# Patient Record
Sex: Male | Born: 1935 | Race: White | Hispanic: No | Smoking: Former smoker
Health system: Southern US, Community
[De-identification: ages and names within clinical notes are randomized; demographics above are authoritative.]

## PROBLEM LIST (undated history)

## (undated) DIAGNOSIS — C4490 Unspecified malignant neoplasm of skin, unspecified: Secondary | ICD-10-CM

## (undated) DIAGNOSIS — N400 Enlarged prostate without lower urinary tract symptoms: Secondary | ICD-10-CM

## (undated) DIAGNOSIS — E7800 Pure hypercholesterolemia, unspecified: Secondary | ICD-10-CM

## (undated) DIAGNOSIS — H9190 Unspecified hearing loss, unspecified ear: Secondary | ICD-10-CM

## (undated) DIAGNOSIS — Z66 Do not resuscitate: Secondary | ICD-10-CM

## (undated) DIAGNOSIS — R011 Cardiac murmur, unspecified: Secondary | ICD-10-CM

## (undated) DIAGNOSIS — I10 Essential (primary) hypertension: Secondary | ICD-10-CM

## (undated) DIAGNOSIS — F419 Anxiety disorder, unspecified: Secondary | ICD-10-CM

## (undated) DIAGNOSIS — K219 Gastro-esophageal reflux disease without esophagitis: Secondary | ICD-10-CM

## (undated) DIAGNOSIS — F0390 Unspecified dementia without behavioral disturbance: Secondary | ICD-10-CM

## (undated) DIAGNOSIS — K5909 Other constipation: Secondary | ICD-10-CM

## (undated) DIAGNOSIS — M1990 Unspecified osteoarthritis, unspecified site: Secondary | ICD-10-CM

## (undated) HISTORY — DX: Other constipation: K59.09

## (undated) HISTORY — PX: EYE SURGERY: SHX253

## (undated) HISTORY — DX: Pure hypercholesterolemia, unspecified: E78.00

## (undated) HISTORY — DX: Cardiac murmur, unspecified: R01.1

## (undated) HISTORY — PX: ANAL FISSURE REPAIR: SHX2312

## (undated) HISTORY — DX: Unspecified malignant neoplasm of skin, unspecified: C44.90

## (undated) HISTORY — PX: CHOLECYSTECTOMY: SHX55

---

## 2004-02-01 ENCOUNTER — Ambulatory Visit: Payer: Self-pay | Admitting: Unknown Physician Specialty

## 2006-05-01 ENCOUNTER — Ambulatory Visit: Payer: Self-pay | Admitting: Urology

## 2006-07-27 ENCOUNTER — Other Ambulatory Visit: Payer: Self-pay

## 2006-07-27 ENCOUNTER — Emergency Department: Payer: Self-pay | Admitting: Emergency Medicine

## 2007-03-21 ENCOUNTER — Other Ambulatory Visit: Payer: Self-pay

## 2007-03-21 ENCOUNTER — Ambulatory Visit: Payer: Self-pay | Admitting: Internal Medicine

## 2008-01-22 ENCOUNTER — Ambulatory Visit: Payer: Self-pay | Admitting: Unknown Physician Specialty

## 2008-03-21 ENCOUNTER — Ambulatory Visit: Payer: Self-pay | Admitting: Unknown Physician Specialty

## 2008-08-22 ENCOUNTER — Emergency Department: Payer: Self-pay | Admitting: Unknown Physician Specialty

## 2011-01-20 DIAGNOSIS — I10 Essential (primary) hypertension: Secondary | ICD-10-CM | POA: Insufficient documentation

## 2011-04-01 DIAGNOSIS — E7800 Pure hypercholesterolemia, unspecified: Secondary | ICD-10-CM | POA: Insufficient documentation

## 2011-04-01 HISTORY — DX: Pure hypercholesterolemia, unspecified: E78.00

## 2013-12-03 ENCOUNTER — Inpatient Hospital Stay: Payer: Self-pay | Admitting: Internal Medicine

## 2013-12-03 LAB — URINALYSIS, COMPLETE
Bacteria: NONE SEEN
Blood: NEGATIVE
Glucose,UR: NEGATIVE mg/dL (ref 0–75)
Leukocyte Esterase: NEGATIVE
NITRITE: NEGATIVE
PH: 5 (ref 4.5–8.0)
Protein: 100
Specific Gravity: 1.026 (ref 1.003–1.030)
Squamous Epithelial: 1
WBC UR: 8 /HPF (ref 0–5)

## 2013-12-03 LAB — CBC
HCT: 42.2 % (ref 40.0–52.0)
HGB: 13.9 g/dL (ref 13.0–18.0)
MCH: 30.1 pg (ref 26.0–34.0)
MCHC: 32.9 g/dL (ref 32.0–36.0)
MCV: 92 fL (ref 80–100)
PLATELETS: 216 10*3/uL (ref 150–440)
RBC: 4.61 10*6/uL (ref 4.40–5.90)
RDW: 14.6 % — ABNORMAL HIGH (ref 11.5–14.5)
WBC: 17 10*3/uL — ABNORMAL HIGH (ref 3.8–10.6)

## 2013-12-03 LAB — COMPREHENSIVE METABOLIC PANEL
ALBUMIN: 3.7 g/dL (ref 3.4–5.0)
Alkaline Phosphatase: 105 U/L
Anion Gap: 11 (ref 7–16)
BUN: 18 mg/dL (ref 7–18)
Bilirubin,Total: 1.4 mg/dL — ABNORMAL HIGH (ref 0.2–1.0)
CALCIUM: 9.4 mg/dL (ref 8.5–10.1)
CREATININE: 1.35 mg/dL — AB (ref 0.60–1.30)
Chloride: 101 mmol/L (ref 98–107)
Co2: 28 mmol/L (ref 21–32)
EGFR (African American): 54 — ABNORMAL LOW
GFR CALC NON AF AMER: 47 — AB
GLUCOSE: 130 mg/dL — AB (ref 65–99)
OSMOLALITY: 283 (ref 275–301)
POTASSIUM: 3.2 mmol/L — AB (ref 3.5–5.1)
SGOT(AST): 364 U/L — ABNORMAL HIGH (ref 15–37)
SGPT (ALT): 390 U/L — ABNORMAL HIGH
Sodium: 140 mmol/L (ref 136–145)
Total Protein: 7 g/dL (ref 6.4–8.2)

## 2013-12-03 LAB — CK TOTAL AND CKMB (NOT AT ARMC)
CK, TOTAL: 64 U/L
CK-MB: 1 ng/mL (ref 0.5–3.6)

## 2013-12-03 LAB — LIPASE, BLOOD: LIPASE: 2568 U/L — AB (ref 73–393)

## 2013-12-03 LAB — LIPID PANEL
CHOLESTEROL: 104 mg/dL (ref 0–200)
HDL Cholesterol: 50 mg/dL (ref 40–60)
Ldl Cholesterol, Calc: 36 mg/dL (ref 0–100)
TRIGLYCERIDES: 92 mg/dL (ref 0–200)
VLDL Cholesterol, Calc: 18 mg/dL (ref 5–40)

## 2013-12-03 LAB — TROPONIN I

## 2013-12-04 LAB — COMPREHENSIVE METABOLIC PANEL
ALT: 201 U/L — AB
Albumin: 2.6 g/dL — ABNORMAL LOW (ref 3.4–5.0)
Alkaline Phosphatase: 73 U/L
Anion Gap: 7 (ref 7–16)
BILIRUBIN TOTAL: 0.8 mg/dL (ref 0.2–1.0)
BUN: 27 mg/dL — ABNORMAL HIGH (ref 7–18)
CALCIUM: 8 mg/dL — AB (ref 8.5–10.1)
CHLORIDE: 109 mmol/L — AB (ref 98–107)
CO2: 29 mmol/L (ref 21–32)
Creatinine: 1.17 mg/dL (ref 0.60–1.30)
EGFR (African American): >60
EGFR (Non-African Amer.): 56 — ABNORMAL LOW
GLUCOSE: 114 mg/dL — AB (ref 65–99)
OSMOLALITY: 295 (ref 275–301)
POTASSIUM: 4.1 mmol/L (ref 3.5–5.1)
SGOT(AST): 110 U/L — ABNORMAL HIGH (ref 15–37)
Sodium: 145 mmol/L (ref 136–145)
TOTAL PROTEIN: 5.5 g/dL — AB (ref 6.4–8.2)

## 2013-12-04 LAB — LIPASE, BLOOD: Lipase: 279 U/L (ref 73–393)

## 2013-12-05 LAB — LIPASE, BLOOD: LIPASE: 89 U/L (ref 73–393)

## 2013-12-06 LAB — CBC WITH DIFFERENTIAL/PLATELET
BASOS PCT: 0.1 %
Basophil #: 0 10*3/uL (ref 0.0–0.1)
EOS ABS: 0 10*3/uL (ref 0.0–0.7)
Eosinophil %: 0.2 %
HCT: 24.9 % — ABNORMAL LOW (ref 40.0–52.0)
HGB: 8.4 g/dL — ABNORMAL LOW (ref 13.0–18.0)
LYMPHS ABS: 0.4 10*3/uL — AB (ref 1.0–3.6)
Lymphocyte %: 4.9 %
MCH: 30.9 pg (ref 26.0–34.0)
MCHC: 33.8 g/dL (ref 32.0–36.0)
MCV: 91 fL (ref 80–100)
Monocyte #: 0.6 x10 3/mm (ref 0.2–1.0)
Monocyte %: 7.8 %
NEUTROS ABS: 6.4 10*3/uL (ref 1.4–6.5)
Neutrophil %: 87 %
PLATELETS: 144 10*3/uL — AB (ref 150–440)
RBC: 2.73 10*6/uL — ABNORMAL LOW (ref 4.40–5.90)
RDW: 14.2 % (ref 11.5–14.5)
WBC: 7.3 10*3/uL (ref 3.8–10.6)

## 2013-12-06 LAB — CULTURE, BLOOD (SINGLE)

## 2013-12-07 LAB — CBC WITH DIFFERENTIAL/PLATELET
Basophil #: 0 10*3/uL (ref 0.0–0.1)
Basophil %: 0.2 %
Eosinophil #: 0.1 10*3/uL (ref 0.0–0.7)
Eosinophil %: 1.7 %
HCT: 27.6 % — ABNORMAL LOW (ref 40.0–52.0)
HGB: 9.3 g/dL — ABNORMAL LOW (ref 13.0–18.0)
Lymphocyte #: 0.8 10*3/uL — ABNORMAL LOW (ref 1.0–3.6)
Lymphocyte %: 10 %
MCH: 30.5 pg (ref 26.0–34.0)
MCHC: 33.6 g/dL (ref 32.0–36.0)
MCV: 91 fL (ref 80–100)
MONO ABS: 0.8 x10 3/mm (ref 0.2–1.0)
Monocyte %: 10.1 %
NEUTROS PCT: 78 %
Neutrophil #: 6.1 10*3/uL (ref 1.4–6.5)
Platelet: 171 10*3/uL (ref 150–440)
RBC: 3.05 10*6/uL — AB (ref 4.40–5.90)
RDW: 14.1 % (ref 11.5–14.5)
WBC: 7.8 10*3/uL (ref 3.8–10.6)

## 2013-12-07 LAB — PATHOLOGY REPORT

## 2013-12-08 LAB — CULTURE, BLOOD (SINGLE)

## 2013-12-10 LAB — CULTURE, BLOOD (SINGLE)

## 2014-05-27 ENCOUNTER — Ambulatory Visit: Payer: Self-pay | Admitting: Otolaryngology

## 2014-08-06 NOTE — Discharge Summary (Signed)
PATIENT NAME:  Billy Bennett, Billy Bennett MR#:  [id number] DATE OF BIRTH:  [birth year]  DATE OF ADMISSION:  12/03/2013 DATE OF DISCHARGE:    The patient will be discharged to [facility] for rehabilitation when okay with surgery.    PRESENTING COMPLAINT: Abdominal pain.   DISCHARGE DIAGNOSES:  1.  Enterobacter sepsis suspected due to gallbladder disease.  2.  Hypertension.   PROCEDURES:   Laparoscopic cholecystectomy on 08/23.   CODE STATUS:  DO NOT RESUSCITATE.  CONSULTATIONS: Dia Crawford, MD,   Sherri Rad, MD surgery.   DIET:  Regular, low salt diet.   PHYSICAL THERAPY:  Follow up with Dr. Marina Gravel per recommendation.   MEDICATIONS:  1.  Tylenol 650 mg p.o. every 4 p.r.n.  2.  Clonidine 0.1 mg daily.  3.  Finasteride 5 mg daily.  4.  Clonidine 0.2 mg at bedtime.  5.  Celexa 20 mg daily.  6.  Cardizem CD 240 mg p.o. daily.  7.  Gabapentin 300 mg b.i.d.  8.  Trospium 20 mg p.o. daily.  9.  Flonase 2 sprays, both nostrils daily.  10. Pravachol 20 mg at bedtime.  11.  Hydrochlorothiazide 25 mg daily.  12. Levaquin 250 mg daily for 8 more days.   LABORATORY DATA:  White count is 7.8, hemoglobin and hematocrit is 9.3 and 27.6, platelet count is 171. Lipase is down to 89. Blood cultures repeat 12/05/2013 have been negative for 48 hours. Lipase on admission was 2568. Ultrasound of the abdomen showed echogenic sludge in the gallbladder.  The gallbladder is distended to 4.8 cm, no gallbladder wall thickening or pericholecystic fluid noted.  No biliary dilatation.  Blood cultures on admission were positive for Enterobacter cloacae species.   Chest x-ray, no acute disease. Bilirubin is 1.4. SGPT 390, SGOT is 364 on admission.    BRIEF SUMMARY OF HOSPITAL COURSE:  [name] is a very pleasant 82 year old Caucasian gentleman with a history of hypertension, irregular heart rhythm, and GERD, came into the Emergency Room with abdominal pain.  He was admitted with:  1.  Acute pancreatitis. Etiology was  suspected to be gallbladder disease with possible passage of stone and gallbladder sludge. The patient came in with elevated white count of 17,000 and was started on IV broad-spectrum antibiotics.  Ultrasound of the abdomen showed sludge in the gallbladder along with dilatation of the gallbladder.  LFTs improved.  He was seen by surgery, and underwent cholecystectomy by Dr. Marina Gravel.  The patient postop did well, is on regular breakfast at this time and he is tolerating regular food. He will follow up with surgery as outpatient.  2.  Hypertension.  Clonidine, hydrochlorothiazide, diltiazem were resumed.  P.r.n. IV hydralazine was given.  3.  Hyperlipidemia. Resume statins.   4.  Enterobacter sepsis secondary to gallbladder disease.  He was on IV meropenem, changed to p.o. Levaquin. Will finish up about 10-12 day course of antibiotics.  Repeat blood cultures have been negative. The patient has been afebrile.   Hospital stay otherwise remained stable. The patient was seen by physical therapy, recommends rehabilitation.   He will be discharged to [facility].   CODE STATUS:  No code, DO NOT RESUSCITATE.     ____________________________ Hart Rochester. Posey Pronto, MD sap:DT D: 12/07/2013 09:19:30 ET T: 12/07/2013 15:11:26 ET JOB#: [id number]  cc: Shakura Cowing A. Posey Pronto, MD, <Dictator> Ilda Basset MD ELECTRONICALLY SIGNED 12/09/2013 12:32

## 2014-08-06 NOTE — Consult Note (Signed)
PATIENT NAME:  Billy Bennett, Billy Bennett MR#:  [id number] DATE OF BIRTH:  1923/07/06  DATE OF CONSULTATION:  12/04/2013  CONSULTING PHYSICIAN:  Elta Guadeloupe A. Marina Gravel, MD  HISTORY: This is an 81 year old very active, otherwise healthy white male, with a history of hypercholesterolemia, atrial fibrillation, hiatal hernia, and hypertension, who presents to the Emergency Room and later admitted to the hospitalist service yesterday with sudden onset of epigastric abdominal pain associated with nausea.  He has not had pain like this before.  He has not had any problems eating.  He has had no weight loss. No previous nausea, no vomiting, some heartburn controlled with over-the-counter medications. The patient does not smoke. Does not drink. In the Emergency Room, work-up was consistent with biliary pancreatitis as the source, and as such, was admitted to the medical service.   ALLERGIES: None.   MEDICATIONS AT HOME:  Naprosyn, hydrochlorothiazide, gabapentin, Flonase, finasteride, diltiazem CD, clonidine, and citalopram.   FAMILY HISTORY: Significant for hypertension.   SOCIAL HISTORY: He is a widower, active in his functional activities. Does not smoke. Does not drink. Quit in 1978.   PAST MEDICAL HISTORY: Hypercholesterolemia, atrial fibrillation, gastroesophageal reflux, hypertension, hiatal hernia.   PAST SURGICAL HISTORY: Perirectal fistula in the distant past.   REVIEW OF SYSTEMS: Significant for abdominal pain, negative for fever. Positive for nausea no vomiting, no diarrhea. No jaundice. No sick contacts.  No headaches, no weight loss. No bone pain, no skin rashes. Remaining 10 point review is unremarkable.   PHYSICAL EXAMINATION:  VITAL SIGNS: Temperature is 98.5, pulse of 89, respiratory rate 18, blood pressure 179/73, room air saturation is 96% on room air.  HEART: Regular rate and rhythm.  ABDOMEN: Soft, minimally tender in the epigastrium.  LUNGS: Clear bilaterally. No scars, no hernias. No masses.   HEENT:  Sclera are clear.  Extraocular muscles are intact. NEUROLOGIC AND PSYCHIATRIC: Unremarkable.  GENITOURINARY AND RECTAL:  Deferred.  LABORATORY VALUES: Lipase was 2568 on admission. Electrolytes are unremarkable. Creatinine 1.35, sodium 140, potassium 3.2, AST is 364, ALT 390, alkaline phosphatase 105. Troponins are negative. White count is 17,000.  Hemoglobin 13.9, platelet count 216,000. Urinalysis is unremarkable.   REVIEW OF X-RAYS: Plain film of the chest is unremarkable. Review of ultrasound demonstrates sludge, distended gallbladder. Common bile duct measuring 5 mm, no stones seen within the bile duct.   IMPRESSION: This is an 81 year old active white male with a history of well-controlled hypertension and biliary pancreatitis with cholelithiasis.   RECOMMENDATIONS: At present, patient will need to have a laparoscopic cholecystectomy with intraoperative cholangiography when pain-free.  Repeat lipase today is normal.  I suspect he will be pain free by tomorrow.       ____________________________ Jeannette How Marina Gravel, MD mab:DT D: 12/04/2013 10:59:42 ET T: 12/04/2013 11:13:10 ET JOB#: [id number]  cc: Elta Guadeloupe A. Marina Gravel, MD, <Dictator> Hortencia Conradi MD ELECTRONICALLY SIGNED 12/07/2013 11:54

## 2014-08-06 NOTE — Op Note (Signed)
PATIENT NAME:  Billy Bennett, Billy Bennett MR#:  [id number] DATE OF BIRTH:  Aug 01, 1923  DATE OF PROCEDURE:  12/05/2013  PREOPERATIVE DIAGNOSIS: Biliary pancreatitis.   POSTOPERATIVE DIAGNOSIS: Biliary pancreatitis.   PROCEDURE PERFORMED: Laparoscopic cholecystectomy and laparoscopic common bile duct exploration.   SURGEON: Aariyana Manz A. Marina Gravel, MD   ASSISTANT: Scrub tech.   ANESTHESIA: General endotracheal.   FINDINGS: Stones. Cholangiography failed to demonstrate any obvious stone, but there was very minimal filling of the duodenum. I could appreciate no obvious meniscus sign. Sweep of the bile duct with a 4-French biliary Fogarty was unrevealing. There was some old blood in the right upper quadrant around the gallbladder adherent to the omentum. There was no clear bleeding source.   SPECIMENS: Gallbladder with contents.   DRAINS: 19 mm Blake drain in the gallbladder fossa.   ESTIMATED BLOOD LOSS: 50 mL   DESCRIPTION OF PROCEDURE: With informed consent, supine position, and general oral endotracheal anesthesia, the patient's left arm was padded and tucked at his side. His abdomen was clipped of hair, prepped and draped utilizing ChloraPrep solution. Timeout was observed. A 12 mm blunt Hasson trocar was placed through an open technique through an infraumbilical transversely oriented skin incision with stay sutures being passed through the fascia. Pneumoperitoneum was established. The patient was then positioned in reverse Trendelenburg and airplaned right side up. A 5 mm Bladeless trocar was placed in the right upper quadrant, epigastrium and right lower quadrant. The gallbladder was aspirated of approximately 30 mL of motor oil-appearing bile. Grasper was placed along the fundus of the gallbladder and elevated towards the right shoulder and laterally. Lateral traction was achieved on Hartmann pouch. The hepatoduodenal ligament was incised utilizing blunt technique and hook cautery to the peritoneal layer. Upon  initial laparoscopic evaluation, there was some old clotted blood in the right upper quadrant with no obvious source. It extended into the hepatorenal recess.   With critical view of safety cholecystectomy being achieved, the cystic artery was triply clipped on the portal side, singly clipped on the gallbladder side and a small posterior branch along the lateral aspect was divided between 2 hemoclips. Attempt initially at cholangiography utilizing the Kumar clamp was unsuccessful. As such, occluding clip on the gallbladder was placed. A small cystic ductotomy was fashioned with sharp scissors. The Arrow 4-French ballooned catheter was then inserted into the defect. The balloon was insufflated and dynamic fluoroscopy utilizing half-strength Conray was performed. This demonstrated the above findings. A milligram of glucagon was given by anesthesia. With adequate circulation time, the cholangiography was repeated; a small amount of filling into the duodenum was seen.  I could appreciate no evidence of a clear meniscus sign consistent with a bile duct stone. To discover objects, a 4 mm biliary catheter was then inserted. Balloon was insufflated and withdrawn under direct visualization with fluoroscopy.   At this point, the cystic duct was triply clipped on the portal side and divided. The gallbladder was then retrieved off the gallbladder fossa utilizing hook cautery apparatus and placed into an Endo Catch bag. In the process of doing so, a small rent was made in the hepatic capsule which required argon beam coagulation. Surgiflo 10 mL with thrombin application was applied for hemostasis, and a 19 mm Blake drain was directed into the site, exiting the right lower quadrant port site. The drain was directed into Morison pouch.   With lap and needle count correct x 2, the ports were then removed under direct visualization. Drain site was secured with nylon  suture at the skin. The infraumbilical fascial defect was  closed with a figure-of-eight #0 Vicryl suture in vertical orientation, the existing stay sutures tied to each other. A total of 30 mL of 0.25% plain Marcaine was infiltrated along all skin and fascial incisions prior to closure. Then,  4-0 Vicryl subcuticular was applied to all skin edges, followed by the application of benzoin, Steri-Strips, Telfa, and Tegaderm. The patient was subsequently extubated and taken to the recovery room in stable and satisfactory condition by anesthesia services.   ____________________________ Jeannette How Marina Gravel, MD mab:ST D: 12/05/2013 14:20:59 ET T: 12/05/2013 21:49:44 ET JOB#: [id number]  cc: Elta Guadeloupe A. Marina Gravel, MD, <Dictator> Hortencia Conradi MD ELECTRONICALLY SIGNED 12/07/2013 11:55

## 2014-08-06 NOTE — Discharge Summary (Signed)
PATIENT NAME:  Billy Bennett, Billy Bennett MR#:  [id number] DATE OF BIRTH:  Mar 09, 1924  ADDENDUM:  Please add to medication list: Percocet 5/325, 1-2 q. 8 h. p.r.n.    ____________________________ Hart Rochester. Posey Pronto, MD sap:LT D: 12/07/2013 14:40:56 ET T: 12/07/2013 22:37:05 ET JOB#: [id number]  cc: Orville Widmann A. Posey Pronto, MD, <Dictator> Ilda Basset MD ELECTRONICALLY SIGNED 12/09/2013 12:32

## 2014-08-06 NOTE — H&P (Signed)
PATIENT NAME:  Billy Bennett, FARREL MR#:  [id number] DATE OF BIRTH:  03-22-1924  DATE OF ADMISSION:  12/03/2013  CHIEF COMPLAINT: Chest pain/epigastric pain today.  HISTORY OF PRESENT ILLNESS: Billy Bennett is a pleasant 81 year old Caucasian gentleman with past medical history of hypertension, GERD, irregular heart rhythm and hypercholesterolemia who comes to the Emergency Room after he started having abdominal pain, mainly in the epigastric area, with some chest tightness. In the Emergency Room, the patient appears hemodynamically stable. He was found to have elevated lipase and elevated LFTs. Ultrasound of the abdomen showed sludge in the gallbladder. No gallstones were noted. The patient is being admitted for acute pancreatitis.   PAST MEDICAL HISTORY: 1.  Hypercholesterolemia.  2.  Irregular heart beat.  3.  GERD. 4.  Hypertension.  5.  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  6.  Hiatal hernia.   ALLERGIES: No known drug allergies.   MEDICATIONS: 1.  Naproxen 500 mg b.i.d.  2.  Hydrochlorothiazide 25 mg daily.  3.  Gabapentin 300 mg b.i.d.  4.  Flonase 50 mcg/inhalation 2 sprays once a day.  5.  Finasteride 5 mg p.o. daily.  6.  Diltiazem CD 240 mg 1 capsule b.i.d.  7.  Clonidine 0.1 mg 1 tablet in the morning and 2 in the evening.  8.  Citalopram 20 mg p.o. daily.   FAMILY HISTORY: Positive for hypertension.   SOCIAL HISTORY: The patient lives at home. Nonsmoker, nonalcoholic. He is otherwise active with his daily functional activities.   REVIEW OF SYSTEMS: CONSTITUTIONAL: No fever. Positive for abdominal pain. No weight gain, weight loss.  EYES: No blurred or double vision or redness.  ENT: No tinnitus, ear pain, hearing loss or postnasal drip.  RESPIRATORY: No cough, wheeze, hemoptysis, painful respiration.  CARDIOVASCULAR: No chest pain, orthopnea, edema. Positive for hypertension.  GASTROINTESTINAL: Positive for abdominal epigastric pain. No nausea, vomiting, diarrhea. Positive  for GERD and hiatal hernia.  GENITOURINARY: No dysuria, hematuria or frequency.  ENDOCRINE: No polyuria, nocturia or thyroid problems.  HEMATOLOGY: No anemia, easy bruising or bleeding.  SKIN: No acne, rash or arthritis.  NEUROLOGIC: No CVA, TIA, ataxia, or dysarthria.  PSYCHIATRIC: No anxiety, depression or bipolar disorder. All other systems reviewed and negative.   PHYSICAL EXAMINATION: GENERAL: The patient is awake, alert and oriented x3, not in acute distress.  VITAL SIGNS: Afebrile. Pulse is 109, blood pressure 178/96, respirations 22 and sats 97% on room air.  HEENT: Atraumatic, normocephalic. Pupils are equal, round and reactive to light and accommodation. EOM intact. Oral mucosa is moist.  NECK: Supple. No JVD. No carotid bruit.  LUNGS: Clear to auscultation bilaterally. No rales, rhonchi, respiratory distress or labored breathing.  HEART: Both the heart sounds are normal. Tachycardia present. No murmur heard. PMI not lateralized. Chest nontender.  EXTREMITIES: Good pedal pulses. Good femoral pulses. No lower extremity edema.  ABDOMEN: Soft. Mild tenderness present in the epigastric area. No guarding or rigidity. No mass felt. Bowel sounds present.  NEUROLOGIC: Grossly intact cranial nerves II through XII. No motor or sensory deficit.  PSYCHIATRIC: The patient is awake, alert and oriented x3.  SKIN: Warm and dry.   DIAGNOSTIC DATA: UA negative for UTI.  Ultrasound of the abdomen shows sludge within a distended gallbladder. No gallbladder wall thickening or pericholecystic fluid. Negative sonographic Murphy sign. No biliary duct dilatation.  Chest x-ray shows no acute disease.   CK total is 64. MB is 1. Creatinine is 1.35, glucose is 130. BUN is 18. Potassium  is 3.2. SGPT is 390. SGOT is 360. Total protein is 7. White count is 17, H and H 13.9 and 42. Troponin is 0.02. Lipase is 2568.   ASSESSMENT: An 81 year old, Billy Bennett, with history of hypertension, irregular heart rhythm  and gastroesophageal reflux disease who comes in with abdominal pain for 1 day. He is being with:  1.  Acute pancreatitis. Etiology could be gallbladder disease with possible passage of stone. Ultrasound of the abdomen shows sludge in the gallbladder. Will admit the patient to the medical floor, continue IV fluids for hydration, keep the patient n.p.o. Surgical consultation with Dr. Marina Gravel. Dr. Marina Gravel is aware of the patient. Repeat lipase in the morning along with liver function tests. I will hold off on hydrochlorothiazide which sometimes can cause acute pancreatitis. We will check lipid profile.   2.  High blood pressure. I will be holding off hydrochlorothiazide. Hence, we will continue clonidine and diltiazem for now.  3.  Hyperlipidemia. Since the patient has elevated liver function tests, I will hold off on his statins for now.  4.  Leukocytosis. Appears reactive in the setting of pain. The patient does not have fever or any evidence of infection at this time. His urinalysis is negative. His chest x-ray is negative as well. Continue to monitor and hold off on any antibiotics at this time.  5.  Deep vein thrombosis prophylaxis. Subcutaneous heparin.   Further work-up according to the patient's clinical course. Hospital admission plan was discussed with the patient and the patient's sister who was present in the Emergency Room.   TIME SPENT: 50 minutes.   ____________________________ Hart Rochester Posey Pronto, MD sap:sb D: 12/03/2013 14:14:57 ET T: 12/03/2013 14:45:40 ET JOB#: [id number]  cc: Anecia Nusbaum A. Posey Pronto, MD, <Dictator> Ilda Basset MD ELECTRONICALLY SIGNED 12/09/2013 12:32

## 2014-08-06 NOTE — Consult Note (Signed)
Brief Consult Note: Diagnosis: biliary pancreatitis, cholelithiasis.   Patient was seen by consultant.   Consult note dictated.   Recommend to proceed with surgery or procedure.   Orders entered.   Discussed with Attending MD.   Comments: tentative plan for laparoscopic cholecystectomy and IOC in am if pain free.  Electronic Signatures: Sherri Rad (MD)  (Signed 22-Aug-15 09:31)  Authored: Brief Consult Note   Last Updated: 22-Aug-15 09:31 by Sherri Rad (MD)

## 2015-10-05 ENCOUNTER — Emergency Department: Payer: Medicare Other

## 2015-10-05 ENCOUNTER — Emergency Department
Admission: EM | Admit: 2015-10-05 | Discharge: 2015-10-05 | Disposition: A | Discharge status: Home / Self Care | Payer: Medicare Other | Attending: Emergency Medicine | Admitting: Emergency Medicine

## 2015-10-05 ENCOUNTER — Ambulatory Visit
Admission: EM | Admit: 2015-10-05 | Discharge: 2015-10-05 | Disposition: A | Discharge status: Hospice | Payer: Medicare Other | Attending: Family Medicine | Admitting: Family Medicine

## 2015-10-05 ENCOUNTER — Other Ambulatory Visit: Payer: Self-pay

## 2015-10-05 DIAGNOSIS — T148 Other injury of unspecified body region: Secondary | ICD-10-CM

## 2015-10-05 DIAGNOSIS — IMO0001 Reserved for inherently not codable concepts without codable children: Secondary | ICD-10-CM

## 2015-10-05 DIAGNOSIS — Y929 Unspecified place or not applicable: Secondary | ICD-10-CM | POA: Diagnosis not present

## 2015-10-05 DIAGNOSIS — Y999 Unspecified external cause status: Secondary | ICD-10-CM | POA: Diagnosis not present

## 2015-10-05 DIAGNOSIS — S0181XA Laceration without foreign body of other part of head, initial encounter: Secondary | ICD-10-CM | POA: Diagnosis not present

## 2015-10-05 DIAGNOSIS — I10 Essential (primary) hypertension: Secondary | ICD-10-CM | POA: Diagnosis not present

## 2015-10-05 DIAGNOSIS — Z7951 Long term (current) use of inhaled steroids: Secondary | ICD-10-CM | POA: Insufficient documentation

## 2015-10-05 DIAGNOSIS — Z87891 Personal history of nicotine dependence: Secondary | ICD-10-CM | POA: Insufficient documentation

## 2015-10-05 DIAGNOSIS — Z5181 Encounter for therapeutic drug level monitoring: Secondary | ICD-10-CM | POA: Insufficient documentation

## 2015-10-05 DIAGNOSIS — Z7982 Long term (current) use of aspirin: Secondary | ICD-10-CM | POA: Diagnosis not present

## 2015-10-05 DIAGNOSIS — W19XXXA Unspecified fall, initial encounter: Secondary | ICD-10-CM

## 2015-10-05 DIAGNOSIS — T07XXXA Unspecified multiple injuries, initial encounter: Secondary | ICD-10-CM

## 2015-10-05 DIAGNOSIS — S61011A Laceration without foreign body of right thumb without damage to nail, initial encounter: Secondary | ICD-10-CM | POA: Insufficient documentation

## 2015-10-05 DIAGNOSIS — R030 Elevated blood-pressure reading, without diagnosis of hypertension: Secondary | ICD-10-CM

## 2015-10-05 DIAGNOSIS — Z79899 Other long term (current) drug therapy: Secondary | ICD-10-CM | POA: Diagnosis not present

## 2015-10-05 DIAGNOSIS — W108XXA Fall (on) (from) other stairs and steps, initial encounter: Secondary | ICD-10-CM | POA: Insufficient documentation

## 2015-10-05 DIAGNOSIS — S0990XA Unspecified injury of head, initial encounter: Secondary | ICD-10-CM

## 2015-10-05 DIAGNOSIS — Y9301 Activity, walking, marching and hiking: Secondary | ICD-10-CM | POA: Diagnosis not present

## 2015-10-05 HISTORY — DX: Benign prostatic hyperplasia without lower urinary tract symptoms: N40.0

## 2015-10-05 HISTORY — DX: Essential (primary) hypertension: I10

## 2015-10-05 LAB — BASIC METABOLIC PANEL
ANION GAP: 11 (ref 5–15)
BUN: 29 mg/dL — AB (ref 6–20)
CALCIUM: 9.3 mg/dL (ref 8.9–10.3)
CO2: 24 mmol/L (ref 22–32)
Chloride: 101 mmol/L (ref 101–111)
Creatinine, Ser: 1.09 mg/dL (ref 0.61–1.24)
GFR calc Af Amer: >60 mL/min (ref >60)
GFR calc non Af Amer: 58 mL/min — ABNORMAL LOW (ref >60)
GLUCOSE: 95 mg/dL (ref 65–99)
Potassium: 4.1 mmol/L (ref 3.5–5.1)
Sodium: 136 mmol/L (ref 135–145)

## 2015-10-05 LAB — URINALYSIS COMPLETE WITH MICROSCOPIC (ARMC ONLY)
BACTERIA UA: NONE SEEN
Bilirubin Urine: NEGATIVE
Glucose, UA: NEGATIVE mg/dL
HGB URINE DIPSTICK: NEGATIVE
Ketones, ur: NEGATIVE mg/dL
Leukocytes, UA: NEGATIVE
Nitrite: NEGATIVE
PROTEIN: 30 mg/dL — AB
SPECIFIC GRAVITY, URINE: 1.01 (ref 1.005–1.030)
Squamous Epithelial / LPF: NONE SEEN
pH: 6 (ref 5.0–8.0)

## 2015-10-05 LAB — CBC WITH DIFFERENTIAL/PLATELET
Basophils Absolute: 0 10*3/uL (ref 0–0.1)
Basophils Relative: 0 %
Eosinophils Absolute: 0.1 10*3/uL (ref 0–0.7)
HEMATOCRIT: 41.3 % (ref 40.0–52.0)
Hemoglobin: 14.1 g/dL (ref 13.0–18.0)
Lymphs Abs: 1 10*3/uL (ref 1.0–3.6)
MCH: 30.7 pg (ref 26.0–34.0)
MCHC: 34.2 g/dL (ref 32.0–36.0)
MCV: 89.7 fL (ref 80.0–100.0)
MONO ABS: 0.8 10*3/uL (ref 0.2–1.0)
NEUTROS ABS: 7.8 10*3/uL — AB (ref 1.4–6.5)
Neutrophils Relative %: 80 %
Platelets: 178 10*3/uL (ref 150–440)
RBC: 4.6 MIL/uL (ref 4.40–5.90)
RDW: 14.6 % — AB (ref 11.5–14.5)
WBC: 9.8 10*3/uL (ref 3.8–10.6)

## 2015-10-05 LAB — PROTIME-INR
INR: 1.03
PROTHROMBIN TIME: 13.7 s (ref 11.4–15.0)

## 2015-10-05 LAB — MAGNESIUM: Magnesium: 1.8 mg/dL (ref 1.7–2.4)

## 2015-10-05 LAB — TROPONIN I: Troponin I: <0.03 ng/mL (ref <0.031)

## 2015-10-05 MED ORDER — LIDOCAINE HCL (PF) 1 % IJ SOLN
5.0000 mL | Freq: Once | INTRAMUSCULAR | Status: AC
  Administered 2015-10-05: 5 mL
  Filled 2015-10-05: qty 5

## 2015-10-05 MED ORDER — TETANUS-DIPHTH-ACELL PERTUSSIS 5-2.5-18.5 LF-MCG/0.5 IM SUSP
0.5000 mL | Freq: Once | INTRAMUSCULAR | Status: AC
  Administered 2015-10-05: 0.5 mL via INTRAMUSCULAR

## 2015-10-05 NOTE — Discharge Instructions (Signed)
Facial Laceration ° A facial laceration is a cut on the face. These injuries can be painful and cause bleeding. Lacerations usually heal quickly, but they need special care to reduce scarring. °DIAGNOSIS  °Your health care provider will take a medical history, ask for details about how the injury occurred, and examine the wound to determine how deep the cut is. °TREATMENT  °Some facial lacerations may not require closure. Others may not be able to be closed because of an increased risk of infection. The risk of infection and the chance for successful closure will depend on various factors, including the amount of time since the injury occurred. °The wound may be cleaned to help prevent infection. If closure is appropriate, pain medicines may be given if needed. Your health care provider will use stitches (sutures), wound glue (adhesive), or skin adhesive strips to repair the laceration. These tools bring the skin edges together to allow for faster healing and a better cosmetic outcome. If needed, you may also be given a tetanus shot. °HOME CARE INSTRUCTIONS °· Only take over-the-counter or prescription medicines as directed by your health care provider. °· Follow your health care provider's instructions for wound care. These instructions will vary depending on the technique used for closing the wound. °For Sutures: °· Keep the wound clean and dry.   °· If you were given a bandage (dressing), you should change it at least once a day. Also change the dressing if it becomes wet or dirty, or as directed by your health care provider.   °· Wash the wound with soap and water 2 times a day. Rinse the wound off with water to remove all soap. Pat the wound dry with a clean towel.   °· After cleaning, apply a thin layer of the antibiotic ointment recommended by your health care provider. This will help prevent infection and keep the dressing from sticking.   °· You may shower as usual after the first 24 hours. Do not soak the  wound in water until the sutures are removed.   °· Get your sutures removed as directed by your health care provider. With facial lacerations, sutures should usually be taken out after 4-5 days to avoid stitch marks.   °· Wait a few days after your sutures are removed before applying any makeup. °For Skin Adhesive Strips: °· Keep the wound clean and dry.   °· Do not get the skin adhesive strips wet. You may bathe carefully, using caution to keep the wound dry.   °· If the wound gets wet, pat it dry with a clean towel.   °· Skin adhesive strips will fall off on their own. You may trim the strips as the wound heals. Do not remove skin adhesive strips that are still stuck to the wound. They will fall off in time.   °For Wound Adhesive: °· You may briefly wet your wound in the shower or bath. Do not soak or scrub the wound. Do not swim. Avoid periods of heavy sweating until the skin adhesive has fallen off on its own. After showering or bathing, gently pat the wound dry with a clean towel.   °· Do not apply liquid medicine, cream medicine, ointment medicine, or makeup to your wound while the skin adhesive is in place. This may loosen the film before your wound is healed.   °· If a dressing is placed over the wound, be careful not to apply tape directly over the skin adhesive. This may cause the adhesive to be pulled off before the wound is healed.   °· Avoid   prolonged exposure to sunlight or tanning lamps while the skin adhesive is in place. °· The skin adhesive will usually remain in place for 5-10 days, then naturally fall off the skin. Do not pick at the adhesive film.   °After Healing: °Once the wound has healed, cover the wound with sunscreen during the day for 1 full year. This can help minimize scarring. Exposure to ultraviolet light in the first year will darken the scar. It can take 1-2 years for the scar to lose its redness and to heal completely.  °SEEK MEDICAL CARE IF: °· You have a fever. °SEEK IMMEDIATE  MEDICAL CARE IF: °· You have redness, pain, or swelling around the wound.   °· You see a yellowish-white fluid (pus) coming from the wound.   °  °This information is not intended to replace advice given to you by your health care provider. Make sure you discuss any questions you have with your health care provider. °  °Document Released: 05/09/2004 Document Revised: 04/22/2014 Document Reviewed: 11/12/2012 °Elsevier Interactive Patient Education ©2016 [employer] ° °

## 2015-10-05 NOTE — ED Notes (Addendum)
Pt states fall happened prior to 6p, went to [facility], they sent him here. Pt denies LOC. Pt sensation same on both sides of face, arms, legs. Pt able to move arms and legs. Pt denies pain at this time.   Dr. Burlene Arnt at bedside.   Pt states he has not taken BP medication tonight. Pt takes a baby aspirin daily.

## 2015-10-05 NOTE — ED Notes (Signed)
Pt states he was walking out the door and stepped down first step and then lost his balance. Pt with lac to L forehead, R thumb, L knee. Pt has bandages on each area. Bleeding noted on bandages.

## 2015-10-05 NOTE — Discharge Instructions (Signed)
Head Injury, Adult °You have a head injury. Headaches and throwing up (vomiting) are common after a head injury. It should be easy to wake up from sleeping. Sometimes you must stay in the hospital. Most problems happen within the first 24 hours. Side effects may occur up to 7-10 days after the injury.  °WHAT ARE THE TYPES OF HEAD INJURIES? °Head injuries can be as minor as a bump. Some head injuries can be more severe. More severe head injuries include: °· A jarring injury to the brain (concussion). °· A bruise of the brain (contusion). This mean there is bleeding in the brain that can cause swelling. °· A cracked skull (skull fracture). °· Bleeding in the brain that collects, clots, and forms a bump (hematoma). °WHEN SHOULD I GET HELP RIGHT AWAY?  °· You are confused or sleepy. °· You cannot be woken up. °· You feel sick to your stomach (nauseous) or keep throwing up (vomiting). °· Your dizziness or unsteadiness is getting worse. °· You have very bad, lasting headaches that are not helped by medicine. Take medicines only as told by your doctor. °· You cannot use your arms or legs like normal. °· You cannot walk. °· You notice changes in the black spots in the center of the colored part of your eye (pupil). °· You have clear or bloody fluid coming from your nose or ears. °· You have trouble seeing. °During the next 24 hours after the injury, you must stay with someone who can watch you. This person should get help right away (call 911 in the [place] if you start to shake and are not able to control it (have seizures), you pass out, or you are unable to wake up. °HOW CAN I PREVENT A HEAD INJURY IN THE FUTURE? °· Wear seat belts. °· Wear a helmet while bike riding and playing sports like football. °· Stay away from dangerous activities around the house. °WHEN CAN I RETURN TO NORMAL ACTIVITIES AND ATHLETICS? °See your doctor before doing these activities. You should not do normal activities or play contact sports until 1  week after the following symptoms have stopped: °· Headache that does not go away. °· Dizziness. °· Poor attention. °· Confusion. °· Memory problems. °· Sickness to your stomach or throwing up. °· Tiredness. °· Fussiness. °· Bothered by bright lights or loud noises. °· Anxiousness or depression. °· Restless sleep. °MAKE SURE YOU:  °· Understand these instructions. °· Will watch your condition. °· Will get help right away if you are not doing well or get worse. °  °This information is not intended to replace advice given to you by your health care provider. Make sure you discuss any questions you have with your health care provider. °  °Document Released: 03/14/2008 Document Revised: 04/22/2014 Document Reviewed: 12/07/2012 °Elsevier Interactive Patient Education ©2016 [employer] ° °

## 2015-10-05 NOTE — ED Provider Notes (Signed)
[facility] Emergency Department Provider Note  ____________________________________________   I have reviewed the triage vital signs and the nursing notes.   HISTORY  Chief Complaint Fall    HPI Billy Bennett. is a 82 y.o. male  who presents today after a non-syncopal fall. Patient states he tripped and fell here murmurs falling he hit his head he did not pass out he had no chest pain short of breath or other concerns. He states he did not take his blood pressure medications. He has no chest pain or headache. He states he has an abrasion to his left knee, a cut to his right thumb but no significant pain or difficulty moving the thumb, and a bump on the head. He was sent here by urgent care for further evaluation. Did receive a tetanus shot prior to coming in.      Past Medical History  Diagnosis Date  . Hypertension   . Enlarged prostate     There are no active problems to display for this patient.   Past Surgical History  Procedure Laterality Date  . Cholecystectomy      Current Outpatient Rx  Name  Route  Sig  Dispense  Refill  . aspirin EC 81 MG tablet   Oral   Take 81 mg by mouth daily.         . citalopram (CELEXA) 20 MG tablet   Oral   Take 1 tablet by mouth daily.      10   . cloNIDine (CATAPRES) 0.1 MG tablet   Oral   Take 1 tablet by mouth 3 (three) times daily.      10   . diltiazem (CARDIZEM CD) 240 MG 24 hr capsule   Oral   Take 240 mg by mouth 2 (two) times daily.      10   . finasteride (PROSCAR) 5 MG tablet   Oral   Take 5 mg by mouth daily.         . fluticasone (FLONASE) 50 MCG/ACT nasal spray   Each Nare   Place 2 sprays into both nostrils daily.      10   . hydrochlorothiazide (HYDRODIURIL) 25 MG tablet   Oral   Take 25 mg by mouth daily.         . lansoprazole (PREVACID) 15 MG capsule   Oral   Take 15 mg by mouth 2 (two) times daily before a meal.         . naproxen (NAPROSYN) 500 MG  tablet   Oral   Take 500 mg by mouth 2 (two) times daily.      10   . pravastatin (PRAVACHOL) 20 MG tablet   Oral   Take 20 mg by mouth daily.      10   . tamsulosin (FLOMAX) 0.4 MG CAPS capsule   Oral   Take 0.4 mg by mouth daily.      10   . trospium (SANCTURA) 20 MG tablet   Oral   Take 20 mg by mouth 2 (two) times daily.      10   . vitamin B-12 (CYANOCOBALAMIN) 500 MCG tablet   Oral   Take 500 mcg by mouth daily.           Allergies Ace inhibitors; Telmisartan-hctz; Amoxicillin-pot clavulanate; and Ibuprofen  History reviewed. No pertinent family history.  Social History Social History  Substance Use Topics  . Smoking status: Former Research scientist (life sciences)  . Smokeless tobacco: None  .  Alcohol Use: No    Review of Systems Constitutional: No fever/chills Eyes: No visual changes. ENT: No sore throat. No stiff neck no neck pain Cardiovascular: Denies chest pain. Respiratory: Denies shortness of breath. Gastrointestinal:   no vomiting.  No diarrhea.  No constipation. Genitourinary: Negative for dysuria. Musculoskeletal: Negative lower extremity swelling Skin: Negative for rash. Neurological: Negative for headaches, focal weakness or numbness. 10-point ROS otherwise negative.  ____________________________________________   PHYSICAL EXAM:  VITAL SIGNS: ED Triage Vitals  Enc Vitals Group     BP 10/05/15 2030 194/86 mmHg     Pulse Rate 10/05/15 2030 71     Resp 10/05/15 2030 23     Temp --      Temp src --      SpO2 10/05/15 2030 100 %     Weight 10/05/15 2019 143 lb 8.3 oz (65.1 kg)     Height 10/05/15 2019 5\' 6"  (1.676 m)     Head Cir --      Peak Flow --      Pain Score 10/05/15 2020 0     Pain Loc --      Pain Edu? --      Excl. in [place]? --     Constitutional: Alert and oriented. Well appearing and in no acute distress. Eyes: Conjunctivae are normal. PERRL. EOMI. Head: Small laceration to the left forehead bleeding controlled no skull fracture  palpated. Nose: No congestion/rhinnorhea. Mouth/Throat: Mucous membranes are moist.  Oropharynx non-erythematous. Neck: No stridor.   Nontender with no meningismus Cardiovascular: Normal rate, regular rhythm. Grossly normal heart sounds.  Good peripheral circulation. Respiratory: Normal respiratory effort.  No retractions. Lungs CTAB. Abdominal: Soft and nontender. No distention. No guarding no rebound Back:  There is no focal tenderness or step off there is no midline tenderness there are no lesions noted. there is no CVA tenderness Musculoskeletal: No lower extremity tenderness. No joint effusions, no DVT signs strong distal pulses no edema, there is abrasion above the left knee with full painless range of motion of the knee, there is Neurologic:  Normal speech and language. No gross focal neurologic deficits are appreciated.  Skin: There is a laceration to the palmar aspect of the proximal right thumb with no evidence of joint disruption foreign body or tendon injury with good cap refill distally.Marland Kitchen Psychiatric: Mood and affect are normal. Speech and behavior are normal.  ____________________________________________   LABS (all labs ordered are listed, but only abnormal results are displayed)  Labs Reviewed  CBC WITH DIFFERENTIAL/PLATELET - Abnormal; Notable for the following:    RDW 14.6 (*)    Neutro Abs 7.8 (*)    All other components within normal limits  BASIC METABOLIC PANEL - Abnormal; Notable for the following:    BUN 29 (*)    GFR calc non Af Amer 58 (*)    All other components within normal limits  URINALYSIS COMPLETEWITH MICROSCOPIC ([facility] ONLY) - Abnormal; Notable for the following:    Color, Urine YELLOW (*)    APPearance CLEAR (*)    Protein, ur 30 (*)    All other components within normal limits  TROPONIN I  MAGNESIUM  PROTIME-INR   ____________________________________________  EKG  I personally interpreted any EKGs ordered by me or triage Sinus rhythm, no  acute ST elevation or depression, rate 71 bpm, no acute ischemic changes noted. PVCs noted.  ____________________________________________  [id number]  I reviewed any imaging ordered by me or triage that were performed during my shift  and, if possible, patient and/or family made aware of any abnormal findings. ____________________________________________   PROCEDURES  Procedure(s) performed: LACERATION REPAIR Performed by: Schuyler Amor Authorized by: Schuyler Amor Consent: Verbal consent obtained. Risks and benefits: risks, benefits and alternatives were discussed Consent given by: patient Patient identity confirmed: provided demographic data Prepped and Draped in normal sterile fashion Wound explored  Laceration Location: Right thumb  Laceration Length: 2.4 cm  No Foreign Bodies seen or palpated  Anesthesia: local infiltration  Local anesthetic: lidocaine 1% % without epinephrine  Anesthetic total: 3 ml  Irrigation method: syringe Amount of cleaning: standard  Skin closure: Interrupted   Number of sutures: 3  3-0 sutures   Technique: Interrupted   Patient tolerance: Patient tolerated the procedure well with no immediate complications.   LACERATION REPAIR Performed by: Schuyler Amor Authorized by: Schuyler Amor Consent: Verbal consent obtained. Risks and benefits: risks, benefits and alternatives were discussed Consent given by: patient Patient identity confirmed: provided demographic data Prepped and Draped in normal sterile fashion Wound explored  Laceration Location: Forehead  Laceration Length: 2 cm  No Foreign Bodies seen or palpated  Anesthesia: local infiltration  Local anesthetic: None   Anesthetic total: 0 ml  Irrigation method: syringe Amount of cleaning: standard  Skin closure: Dermabond   Number of sutures: None   Technique: Tissue adhesive   Patient tolerance: Patient tolerated the procedure well with no immediate  complications.    Critical Care performed: None  ____________________________________________   INITIAL IMPRESSION / ASSESSMENT AND PLAN / ED COURSE  Pertinent labs & imaging results that were available during my care of the patient were reviewed by me and considered in my medical decision making (see chart for details).  Patient here after nonsurgical fall, workup is reassuring. Blood pressure somewhat elevated patient has no symptoms with this,, do not think the fall was caused by his incidental finding of elevated blood pressure. Patient states his blood pressure is up when he is in the emergency room regularly. In any event, he will take his blood pressure medicines when he gets home. He has no chest pain or shortness of breath. And again this was a nonstick will fall he clearly remembers tripping. Patient has edema skin is sutures out in about 7 days, he will check with his doctor tomorrow for a recheck of his blood pressure. Made awake and alert and in no distress during his stay ____________________________________________   FINAL CLINICAL IMPRESSION(S) / ED DIAGNOSES  Final diagnoses:  Fall, initial encounter  Laceration of thumb, right, initial encounter  Laceration of forehead, initial encounter  Elevated blood pressure      This chart was dictated using voice recognition software.  Despite best efforts to proofread,  errors can occur which can change meaning.     Schuyler Amor, MD 10/05/15 2234

## 2015-10-05 NOTE — ED Provider Notes (Signed)
CSN: [id number]     Arrival date & time 10/05/15  1848 History   First MD Initiated Contact with Patient 10/05/15 1933    Nurses notes were reviewed. Chief Complaint  Patient presents with  . Head Injury    Pt missed a step coming out the back door and fell onto cement sustaining a laceration to his forehead and his right thumb, and left knee abrasion. No LOC. Denies pain.   Patient states that he fell down to start 2 flights of steps this evening at home. Unfortunately the fall was not witnessed. He states that he was not knocked out when asked about medications taken he: This Proscar but when his Epic record was searched he is on multiple other medications. He denies any injury to his neck but he does have a large gash over his left forehead and injury to his right wrist and his left knee. She denies any blurred vision but he is 82 years old.  Past history he's had a cholecystectomy he has hypertension and enlarged prostate he is a former smoker. No known drug allergies. There is a list of medications can be found at the care everywhere site   (Consider location/radiation/quality/duration/timing/severity/associated sxs/prior Treatment) Patient is a 82 y.o. male presenting with head injury. The history is provided by the patient. No language interpreter was used.  Head Injury Location:  Frontal Time since incident:  30 minutes Mechanism of injury: direct blow and fall   Pain details:    Quality:  Sharp and pressure   Severity:  Moderate   Timing:  Constant Chronicity:  New Relieved by:  Nothing Ineffective treatments:  None tried Associated symptoms: headache   Associated symptoms: no disorientation, no loss of consciousness, no memory loss, no neck pain and no vomiting     Past Medical History  Diagnosis Date  . Hypertension   . Enlarged prostate    Past Surgical History  Procedure Laterality Date  . Cholecystectomy     History reviewed. No pertinent family history. Social  History  Substance Use Topics  . Smoking status: Former Research scientist (life sciences)  . Smokeless tobacco: None  . Alcohol Use: No    Review of Systems  Gastrointestinal: Negative for vomiting.  Musculoskeletal: Negative for neck pain.  Neurological: Positive for headaches. Negative for loss of consciousness.  Psychiatric/Behavioral: Negative for memory loss.  All other systems reviewed and are negative.   Allergies  Review of patient's allergies indicates no known allergies.  Home Medications   Prior to Admission medications   Medication Sig Start Date End Date Taking? Authorizing Provider  finasteride (PROSCAR) 5 MG tablet Take 5 mg by mouth daily.   Yes Historical Provider, MD   Meds Ordered and Administered this Visit   Medications  Tdap (BOOSTRIX) injection 0.5 mL (0.5 mLs Intramuscular Given 10/05/15 1908)    BP 170/70 mmHg  Pulse 63  Temp(Src) 97.8 F (36.6 C) (Oral)  Resp 18  Ht 5\' 6"  (1.676 m)  Wt 140 lb (63.504 kg)  BMI 22.61 kg/m2  SpO2 100% No data found.   Physical Exam  Constitutional: He appears well-developed.  HENT:  Head: Normocephalic. Head is with laceration.    Right Ear: Hearing and tympanic membrane normal.  Left Ear: Hearing and tympanic membrane normal.  Nose: No mucosal edema or rhinorrhea.  Mouth/Throat: Uvula is midline.  Eyes: Pupils are equal, round, and reactive to light.  Neck: Normal range of motion. Neck supple.  He should move his neck up and down  to and fro to show that he does not need any type of CT or radiographic procedure of the C-spine  Musculoskeletal: Normal range of motion. He exhibits tenderness.  Aspiration of the right thumb and laceration of left knee  Neurological: He is alert. No cranial nerve deficit.  He reports no loss of consciousness but apparently does have some trouble with his memory and unable to determine whether this is new or old  Skin: Skin is warm.  Psychiatric: He has a normal mood and affect.    ED Course    Procedures (including critical care time)  Labs Review Labs Reviewed - No data to display  Imaging Review No results found.   Visual Acuity Review  Right Eye Distance:   Left Eye Distance:   Bilateral Distance:    Right Eye Near:   Left Eye Near:    Bilateral Near:         MDM   1. Head injury, initial encounter   2. Multiple lacerations    Due to his age unwitnessed event and and lack of clarity with medication recommend [place] Silos assiduously but she refuses. Apparently he had a falling out with his urologist who was with [facility] system. He will go to [facility] beats sole of his lacerations first which I refused he seems somewhat perturbed about that because later his list that he wasn't going to get CT scan done tonight.  Discussed with Lea at [facility] his refusal to have EMS bring him to the facility. An informed her that tetanus was given.    Note: This dictation was prepared with Dragon dictation along with smaller phrase technology. Any transcriptional errors that result from this process are unintentional.    Frederich Cha, MD 10/05/15 2008

## 2015-10-05 NOTE — ED Notes (Signed)
Pt to scans via stretcher 

## 2016-02-15 ENCOUNTER — Other Ambulatory Visit: Payer: Self-pay | Admitting: Physician Assistant

## 2016-02-15 DIAGNOSIS — M2391 Unspecified internal derangement of right knee: Secondary | ICD-10-CM

## 2016-03-13 ENCOUNTER — Ambulatory Visit
Admission: RE | Admit: 2016-03-13 | Discharge: 2016-03-13 | Disposition: A | Discharge status: Home / Self Care | Payer: Medicare Other | Source: Ambulatory Visit | Attending: Physician Assistant | Admitting: Physician Assistant

## 2016-03-13 DIAGNOSIS — S83241A Other tear of medial meniscus, current injury, right knee, initial encounter: Secondary | ICD-10-CM | POA: Insufficient documentation

## 2016-03-13 DIAGNOSIS — M2391 Unspecified internal derangement of right knee: Secondary | ICD-10-CM

## 2016-03-13 DIAGNOSIS — X58XXXA Exposure to other specified factors, initial encounter: Secondary | ICD-10-CM | POA: Insufficient documentation

## 2016-03-13 DIAGNOSIS — M7121 Synovial cyst of popliteal space [Baker], right knee: Secondary | ICD-10-CM | POA: Insufficient documentation

## 2016-03-14 ENCOUNTER — Ambulatory Visit: Payer: Medicare Other

## 2016-06-28 NOTE — Discharge Instructions (Signed)
°  Instructions after Knee Arthroscopy  ° ° Ferrah Panagopoulos P. Damien Batty, Jr., M.D.    ° Dept. of Orthopaedics & Sports Medicine ° [facility] ° [address] ° [place], [place]  [address] ° ° Phone: [phone]   Fax: [phone] ° ° °DIET: °• Drink plenty of non-alcoholic fluids & begin a light diet. °• Resume your normal diet the day after surgery. ° °ACTIVITY:  °• You may use crutches or a walker with weight-bearing as tolerated, unless instructed otherwise. °• You may wean yourself off of the walker or crutches as tolerated.  °• Begin doing gentle exercises. Exercising will reduce the pain and swelling, increase motion, and prevent muscle weakness.   °• Avoid strenuous activities or athletics for a minimum of 4-6 weeks after arthroscopic surgery. °• Do not drive or operate any equipment until instructed. ° °WOUND CARE:  °• Place one to two pillows under the knee the first day or two when sitting or lying.  °• Continue to use the ice packs periodically to reduce pain and swelling. °• The small incisions in your knee are closed with nylon stitches. The stitches will be removed in the office. °• The bulky dressing may be removed on the second day after surgery. DO NOT TOUCH THE STITCHES. Put a Band-Aid over each stitch. Do NOT use any ointments or creams on the incisions.  °• You may bathe or shower after the stitches are removed at the first office visit following surgery. ° °MEDICATIONS: °• You may resume your regular medications. °• Please take the pain medication as prescribed. °• Do not take pain medication on an empty stomach. °• Do not drive or drink alcoholic beverages when taking pain medications. ° °CALL THE OFFICE FOR: °• Temperature above 101 degrees °• Excessive bleeding or drainage on the dressing. °• Excessive swelling, coldness, or paleness of the toes. °• Persistent nausea and vomiting. ° °FOLLOW-UP:  °• You should have an appointment to return to the office in 7-10 days after surgery.  °  °

## 2016-07-05 ENCOUNTER — Encounter
Admission: RE | Admit: 2016-07-05 | Discharge: 2016-07-05 | Disposition: A | Discharge status: Home / Self Care | Payer: Medicare Other | Source: Ambulatory Visit | Attending: Orthopedic Surgery | Admitting: Orthopedic Surgery

## 2016-07-05 DIAGNOSIS — M2391 Unspecified internal derangement of right knee: Secondary | ICD-10-CM | POA: Diagnosis present

## 2016-07-05 DIAGNOSIS — E7800 Pure hypercholesterolemia, unspecified: Secondary | ICD-10-CM | POA: Diagnosis not present

## 2016-07-05 DIAGNOSIS — F419 Anxiety disorder, unspecified: Secondary | ICD-10-CM | POA: Diagnosis not present

## 2016-07-05 DIAGNOSIS — I10 Essential (primary) hypertension: Secondary | ICD-10-CM | POA: Diagnosis not present

## 2016-07-05 DIAGNOSIS — M1990 Unspecified osteoarthritis, unspecified site: Secondary | ICD-10-CM | POA: Diagnosis not present

## 2016-07-05 DIAGNOSIS — Z87891 Personal history of nicotine dependence: Secondary | ICD-10-CM | POA: Diagnosis not present

## 2016-07-05 DIAGNOSIS — M23321 Other meniscus derangements, posterior horn of medial meniscus, right knee: Secondary | ICD-10-CM | POA: Diagnosis not present

## 2016-07-05 DIAGNOSIS — K219 Gastro-esophageal reflux disease without esophagitis: Secondary | ICD-10-CM | POA: Diagnosis not present

## 2016-07-05 HISTORY — DX: Gastro-esophageal reflux disease without esophagitis: K21.9

## 2016-07-05 HISTORY — DX: Unspecified osteoarthritis, unspecified site: M19.90

## 2016-07-05 HISTORY — DX: Pure hypercholesterolemia, unspecified: E78.00

## 2016-07-05 HISTORY — DX: Anxiety disorder, unspecified: F41.9

## 2016-07-05 HISTORY — DX: Unspecified hearing loss, unspecified ear: H91.90

## 2016-07-05 LAB — CBC
HCT: 37.7 % — ABNORMAL LOW (ref 40.0–52.0)
Hemoglobin: 13 g/dL (ref 13.0–18.0)
MCH: 30.3 pg (ref 26.0–34.0)
MCHC: 34.5 g/dL (ref 32.0–36.0)
MCV: 87.8 fL (ref 80.0–100.0)
PLATELETS: 187 10*3/uL (ref 150–440)
RBC: 4.29 MIL/uL — AB (ref 4.40–5.90)
RDW: 15.2 % — ABNORMAL HIGH (ref 11.5–14.5)
WBC: 7.4 10*3/uL (ref 3.8–10.6)

## 2016-07-05 LAB — BASIC METABOLIC PANEL
Anion gap: 7 (ref 5–15)
BUN: 21 mg/dL — AB (ref 6–20)
CO2: 31 mmol/L (ref 22–32)
CREATININE: 1.01 mg/dL (ref 0.61–1.24)
Calcium: 9.1 mg/dL (ref 8.9–10.3)
Chloride: 99 mmol/L — ABNORMAL LOW (ref 101–111)
GFR calc Af Amer: >60 mL/min (ref >60)
Glucose, Bld: 134 mg/dL — ABNORMAL HIGH (ref 65–99)
POTASSIUM: 3.5 mmol/L (ref 3.5–5.1)
Sodium: 137 mmol/L (ref 135–145)

## 2016-07-05 NOTE — Patient Instructions (Signed)
  Your procedure is scheduled on: July 08, 2016 (Monday) Report to Same Day Surgery 2nd floor medical mall [facility] Entrance-take elevator on left to 2nd floor.  Check in with surgery information desk.) ARRIVAL TIME 2:30 P. M.   Remember: Instructions that are not followed completely may result in serious medical risk, up to and including death, or upon the discretion of your surgeon and anesthesiologist your surgery may need to be rescheduled.    _x___ 1. Do not eat food or drink liquids after midnight. No gum chewing or hard candies.                              __x__ 2. No Alcohol for 24 hours before or after surgery.   __x__3. No Smoking for 24 prior to surgery.   ____  4. Bring all medications with you on the day of surgery if instructed.    __x__ 5. Notify your doctor if there is any change in your medical condition     (cold, fever, infections).     Do not wear jewelry, make-up, hairpins, clips or nail polish.  Do not wear lotions, powders, or perfumes. You may wear deodorant.  Do not shave 48 hours prior to surgery. Men may shave face and neck.  Do not bring valuables to the hospital.    [facility] is not responsible for any belongings or valuables.               Contacts, dentures or bridgework may not be worn into surgery.  Leave your suitcase in the car. After surgery it may be brought to your room.  For patients admitted to the hospital, discharge time is determined by your  treatment team.                       Patients discharged the day of surgery will not be allowed to drive home.  You will need someone to drive you home and stay with you the night of your procedure.    Please read over the following fact sheets that you were given:   [facility] Preparing for Surgery and or MRSA Information   _x___ Take anti-hypertensive (unless it includes a diuretic), cardiac, seizure, asthma,     anti-reflux and psychiatric medicines. These include:  1. CITALOPRAM  2.  CLONIDINE  3. DILTIAZEM  4. LANSOPRAZOLE  5.  6.  ____Fleets enema or Magnesium Citrate as directed.   _x___ Use CHG Soap or sage wipes as directed on instruction sheet   ____ Use inhalers on the day of surgery and bring to hospital day of surgery  ____ Stop Metformin and Janumet 2 days prior to surgery.    ____ Take 1/2 of usual insulin dose the night before surgery and none on the morning surgery       _x___ Follow recommendations from Cardiologist, Pulmonologist or PCP regarding          stopping Aspirin, Coumadin, Pllavix ,Eliquis, Effient, or Pradaxa, and Pletal. (STOP ASPIRIN TODAY)  X____Stop Anti-inflammatories such as Advil, Aleve, Ibuprofen, Motrin, Naproxen, Naprosyn, Goodies powders or aspirin products. OK to take Tylenol  (STOP NAPROXEN TODAY)   _x___ Stop supplements until after surgery.  But may continue Vitamin D, Vitamin B,and multivitamin . (STOP VITAMIN E TODAY)          ____ Bring C-Pap to the hospital.

## 2016-07-05 NOTE — Pre-Procedure Instructions (Signed)
Faxed request for H&P to Dr. Clydell Hakim office.

## 2016-07-08 ENCOUNTER — Ambulatory Visit
Admission: RE | Admit: 2016-07-08 | Discharge: 2016-07-08 | Disposition: A | Discharge status: Home / Self Care | Payer: Medicare Other | Source: Ambulatory Visit | Attending: Orthopedic Surgery | Admitting: Orthopedic Surgery

## 2016-07-08 ENCOUNTER — Encounter: Payer: Self-pay | Admitting: Orthopedic Surgery

## 2016-07-08 ENCOUNTER — Encounter
Admission: RE | Disposition: A | Discharge status: Home / Self Care | Payer: Self-pay | Source: Ambulatory Visit | Attending: Orthopedic Surgery

## 2016-07-08 ENCOUNTER — Ambulatory Visit: Payer: Medicare Other | Admitting: Anesthesiology

## 2016-07-08 DIAGNOSIS — M23321 Other meniscus derangements, posterior horn of medial meniscus, right knee: Secondary | ICD-10-CM | POA: Insufficient documentation

## 2016-07-08 DIAGNOSIS — M1990 Unspecified osteoarthritis, unspecified site: Secondary | ICD-10-CM | POA: Insufficient documentation

## 2016-07-08 DIAGNOSIS — Z87891 Personal history of nicotine dependence: Secondary | ICD-10-CM | POA: Insufficient documentation

## 2016-07-08 DIAGNOSIS — E7800 Pure hypercholesterolemia, unspecified: Secondary | ICD-10-CM | POA: Insufficient documentation

## 2016-07-08 DIAGNOSIS — K219 Gastro-esophageal reflux disease without esophagitis: Secondary | ICD-10-CM | POA: Insufficient documentation

## 2016-07-08 DIAGNOSIS — F419 Anxiety disorder, unspecified: Secondary | ICD-10-CM | POA: Insufficient documentation

## 2016-07-08 DIAGNOSIS — I10 Essential (primary) hypertension: Secondary | ICD-10-CM | POA: Insufficient documentation

## 2016-07-08 HISTORY — PX: KNEE ARTHROSCOPY: SHX127

## 2016-07-08 LAB — GLUCOSE, CAPILLARY: Glucose-Capillary: 89 mg/dL (ref 65–99)

## 2016-07-08 SURGERY — ARTHROSCOPY, KNEE
Anesthesia: General | Site: Knee | Laterality: Right | Wound class: Clean

## 2016-07-08 MED ORDER — MORPHINE SULFATE (PF) 4 MG/ML IV SOLN
INTRAVENOUS | Status: DC | PRN
  Administered 2016-07-08: 4 mg

## 2016-07-08 MED ORDER — CHLORHEXIDINE GLUCONATE 4 % EX LIQD: 60.0000 mL | Freq: Once | CUTANEOUS | Status: DC

## 2016-07-08 MED ORDER — ACETAMINOPHEN 10 MG/ML IV SOLN
INTRAVENOUS | Status: AC
  Filled 2016-07-08: qty 100

## 2016-07-08 MED ORDER — FENTANYL CITRATE (PF) 100 MCG/2ML IJ SOLN
INTRAMUSCULAR | Status: AC
  Filled 2016-07-08: qty 2

## 2016-07-08 MED ORDER — ACETAMINOPHEN 10 MG/ML IV SOLN
INTRAVENOUS | Status: DC | PRN
  Administered 2016-07-08: 1000 mg via INTRAVENOUS

## 2016-07-08 MED ORDER — LACTATED RINGERS IV SOLN
INTRAVENOUS | Status: DC | PRN
  Administered 2016-07-08: 16:00:00 via INTRAVENOUS

## 2016-07-08 MED ORDER — PHENYLEPHRINE HCL 10 MG/ML IJ SOLN
INTRAMUSCULAR | Status: DC | PRN
  Administered 2016-07-08: 100 ug via INTRAVENOUS
  Administered 2016-07-08 (×2): 50 ug via INTRAVENOUS

## 2016-07-08 MED ORDER — BUPIVACAINE-EPINEPHRINE (PF) 0.25% -1:200000 IJ SOLN
INTRAMUSCULAR | Status: AC
  Filled 2016-07-08: qty 30

## 2016-07-08 MED ORDER — DEXAMETHASONE SODIUM PHOSPHATE 10 MG/ML IJ SOLN
INTRAMUSCULAR | Status: AC
  Filled 2016-07-08: qty 1

## 2016-07-08 MED ORDER — FENTANYL CITRATE (PF) 100 MCG/2ML IJ SOLN
INTRAMUSCULAR | Status: DC | PRN
  Administered 2016-07-08 (×2): 25 ug via INTRAVENOUS
  Administered 2016-07-08: 50 ug via INTRAVENOUS

## 2016-07-08 MED ORDER — FENTANYL CITRATE (PF) 100 MCG/2ML IJ SOLN: 25.0000 ug | INTRAMUSCULAR | Status: DC | PRN

## 2016-07-08 MED ORDER — ONDANSETRON HCL 4 MG/2ML IJ SOLN
INTRAMUSCULAR | Status: AC
  Filled 2016-07-08: qty 2

## 2016-07-08 MED ORDER — ONDANSETRON HCL 4 MG/2ML IJ SOLN: 4.0000 mg | Freq: Once | INTRAMUSCULAR | Status: DC | PRN

## 2016-07-08 MED ORDER — DEXAMETHASONE SODIUM PHOSPHATE 10 MG/ML IJ SOLN
INTRAMUSCULAR | Status: DC | PRN
  Administered 2016-07-08: 10 mg via INTRAVENOUS

## 2016-07-08 MED ORDER — ONDANSETRON HCL 4 MG/2ML IJ SOLN
INTRAMUSCULAR | Status: DC | PRN
  Administered 2016-07-08: 4 mg via INTRAVENOUS

## 2016-07-08 MED ORDER — PROPOFOL 10 MG/ML IV BOLUS
INTRAVENOUS | Status: DC | PRN
  Administered 2016-07-08: 30 mg via INTRAVENOUS
  Administered 2016-07-08: 60 mg via INTRAVENOUS

## 2016-07-08 MED ORDER — SEVOFLURANE IN SOLN
RESPIRATORY_TRACT | Status: AC
  Filled 2016-07-08: qty 250

## 2016-07-08 MED ORDER — BUPIVACAINE-EPINEPHRINE 0.25% -1:200000 IJ SOLN
INTRAMUSCULAR | Status: DC | PRN
  Administered 2016-07-08: 25 mL
  Administered 2016-07-08: 5 mL

## 2016-07-08 MED ORDER — MORPHINE SULFATE (PF) 4 MG/ML IV SOLN
INTRAVENOUS | Status: AC
  Filled 2016-07-08: qty 1

## 2016-07-08 MED ORDER — PROPOFOL 10 MG/ML IV BOLUS
INTRAVENOUS | Status: AC
  Filled 2016-07-08: qty 20

## 2016-07-08 MED ORDER — HYDROCODONE-ACETAMINOPHEN 5-325 MG PO TABS: 1.0000 | ORAL_TABLET | ORAL | 0 refills | Status: DC | PRN

## 2016-07-08 MED ORDER — SODIUM CHLORIDE 0.9 % IV SOLN
INTRAVENOUS | Status: DC
  Administered 2016-07-08: 16:00:00 via INTRAVENOUS

## 2016-07-08 SURGICAL SUPPLY — 23 items
BLADE SHAVER 4.5 DBL SERAT CV (CUTTER) ×3 IMPLANT
BNDG ESMARK 6X12 TAN STRL LF (GAUZE/BANDAGES/DRESSINGS) ×3 IMPLANT
CUFF TOURN 24 STER (MISCELLANEOUS) ×3 IMPLANT
CUFF TOURN 30 STER DUAL PORT (MISCELLANEOUS) ×3 IMPLANT
DRSG DERMACEA 8X12 NADH (GAUZE/BANDAGES/DRESSINGS) ×3 IMPLANT
DURAPREP 26ML APPLICATOR (WOUND CARE) ×6 IMPLANT
GAUZE SPONGE 4X4 12PLY STRL (GAUZE/BANDAGES/DRESSINGS) ×3 IMPLANT
GLOVE BIOGEL M STRL SZ7.5 (GLOVE) ×3 IMPLANT
GLOVE INDICATOR 8.0 STRL GRN (GLOVE) ×3 IMPLANT
GOWN STRL REUS W/ TWL LRG LVL3 (GOWN DISPOSABLE) ×2 IMPLANT
GOWN STRL REUS W/TWL LRG LVL3 (GOWN DISPOSABLE) ×4
IV LACTATED RINGER IRRG 3000ML (IV SOLUTION) ×12
IV LR IRRIG 3000ML ARTHROMATIC (IV SOLUTION) ×6 IMPLANT
KIT RM TURNOVER STRD PROC AR (KITS) ×3 IMPLANT
MANIFOLD NEPTUNE II (INSTRUMENTS) ×3 IMPLANT
PACK ARTHROSCOPY KNEE (MISCELLANEOUS) ×3 IMPLANT
SET TUBE SUCT SHAVER OUTFL 24K (TUBING) ×3 IMPLANT
SET TUBE TIP INTRA-ARTICULAR (MISCELLANEOUS) ×3 IMPLANT
SUT ETHILON 3-0 FS-10 30 BLK (SUTURE) ×3
SUTURE EHLN 3-0 FS-10 30 BLK (SUTURE) ×1 IMPLANT
TUBING ARTHRO INFLOW-ONLY STRL (TUBING) ×3 IMPLANT
WAND HAND CNTRL MULTIVAC 50 (MISCELLANEOUS) ×3 IMPLANT
WRAP KNEE W/COLD PACKS 25.5X14 (SOFTGOODS) ×3 IMPLANT

## 2016-07-08 NOTE — H&P (Signed)
The patient has been re-examined, and the chart reviewed, and there have been no interval changes to the documented history and physical.    The risks, benefits, and alternatives have been discussed at length. The patient expressed understanding of the risks benefits and agreed with plans for surgical intervention.  James P. Hooten, Jr. M.D.    

## 2016-07-08 NOTE — Transfer of Care (Signed)
Immediate Anesthesia Transfer of Care Note  Patient: Billy Bennett.  Procedure(s) Performed: Procedure(s): ARTHROSCOPY KNEE, PARTIAL MEDIAL MENISECTOMY, CHONDROPLASTY (Right)  Patient Location: PACU  Anesthesia Type:General  Level of Consciousness: awake and patient cooperative  Airway & Oxygen Therapy: Patient Spontanous Breathing and Patient connected to face mask oxygen  Post-op Assessment: Report given to RN and Post -op Vital signs reviewed and stable  Post vital signs: Reviewed and stable  Last Vitals:  Vitals:   07/08/16 1410  BP: (!) 165/75  Pulse: 77  Resp: 18  Temp: 36.9 C    Last Pain:  Vitals:   07/08/16 1410  TempSrc: Oral         Complications: No apparent anesthesia complications

## 2016-07-08 NOTE — Anesthesia Postprocedure Evaluation (Signed)
Anesthesia Post Note  Patient: Billy Bennett.  Procedure(s) Performed: Procedure(s) (LRB): ARTHROSCOPY KNEE, PARTIAL MEDIAL MENISECTOMY, CHONDROPLASTY (Right)  Patient location during evaluation: PACU Anesthesia Type: General Level of consciousness: awake and alert Pain management: pain level controlled Vital Signs Assessment: post-procedure vital signs reviewed and stable Respiratory status: spontaneous breathing, nonlabored ventilation, respiratory function stable and patient connected to nasal cannula oxygen Cardiovascular status: blood pressure returned to baseline and stable Postop Assessment: no signs of nausea or vomiting Anesthetic complications: no     Last Vitals:  Vitals:   07/08/16 1848 07/08/16 1856  BP: (!) 160/80 (!) 174/74  Pulse: 80 73  Resp: 15 20  Temp: 36.3 C     Last Pain:  Vitals:   07/08/16 1848  TempSrc: Tympanic  PainSc:                  Precious Haws Lakeva Hollon

## 2016-07-08 NOTE — Anesthesia Procedure Notes (Signed)
Procedure Name: LMA Insertion Date/Time: 07/08/2016 4:30 PM Performed by: Nelda Marseille Pre-anesthesia Checklist: Patient identified, Patient being monitored, Timeout performed, Emergency Drugs available and Suction available Patient Re-evaluated:Patient Re-evaluated prior to inductionOxygen Delivery Method: Circle system utilized Preoxygenation: Pre-oxygenation with 100% oxygen Intubation Type: IV induction Ventilation: Mask ventilation without difficulty LMA: LMA inserted LMA Size: 4.0 Tube type: Oral Number of attempts: 1 Placement Confirmation: positive ETCO2 and breath sounds checked- equal and bilateral Tube secured with: Tape Dental Injury: Teeth and Oropharynx as per pre-operative assessment

## 2016-07-08 NOTE — Op Note (Signed)
OPERATIVE NOTE  DATE OF SURGERY:  07/08/2016  PATIENT NAME:  Billy Bennett.   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   ASSISTANT: none  ANESTHESIA: general  ESTIMATED BLOOD LOSS: Minimal  FLUIDS REPLACED: 400 mL of crystalloid  TOURNIQUET TIME: Not used   DRAINS: none  IMPLANTS UTILIZED: None  INDICATIONS FOR SURGERY: Ciro Tashiro. is a 83 y.o. year old male who has been seen for complaints of right knee pain. MRI demonstrated findings consistent with meniscal pathology. After discussion of the risks and benefits of surgical intervention, the patient expressed understanding of the risks benefits and agree with plans for right knee arthroscopy.   PROCEDURE IN DETAIL: The patient was brought into the operating room and, after adequate general anesthesia was achieved, a tourniquet was applied to the right thigh and the leg was placed in the leg holder. All bony prominences were well padded. The patient's right knee was cleaned and prepped with alcohol and Duraprep and draped in the usual sterile fashion. A "timeout" was performed as per usual protocol. The anticipated portal sites were injected with 0.25% Marcaine with epinephrine. An anterolateral incision was made and a cannula was inserted. A small effusion was evacuated and the knee was distended with fluid using the pump. The scope was advanced down the medial gutter into the medial compartment. Under visualization with the scope, an anteromedial portal was created and a hooked probe was inserted. The medial meniscus was visualized and probed. There was a degenerative tear  of posterior horn of the medial meniscus. The tear was debrided using a combination of meniscal punches and a 4.5 mm incisor shaver. Final contouring was performed using the 50 ArthroCare wand. The remaining rim of meniscus was visualized and probed and felt to be stable. The articular cartilage was visualized. There were grade 3 changes of chondromalacia involving the medial femoral condyle. These areas were debrided and contoured using the ArthroCare wand.  The scope was then advanced into the intercondylar notch. The anterior cruciate ligament was visualized and probed and felt to be intact. The scope was removed from the lateral portal and reinserted via the anteromedial portal to better visualize the lateral compartment. The lateral meniscus was visualized and probed. The lateral meniscus was intact without evidence of tear or instability. The articular cartilage of the lateral compartment was visualized and was noted to be in excellent condition. Finally, the scope was advanced so as to visualize the patellofemoral articulation. Good patellar tracking was appreciated. There were grade 3 changes of chondromalacia involving the sulcus. These areas were debrided and contoured using the ArthroCare wand.  The knee was irrigated with copius amounts of fluid and suctioned dry. The anterolateral portal was re-approximated with #3-0 nylon. A combination of 0.25% Marcaine with epinephrine and 4 mg of Morphine were injected via the scope. The scope was removed and the anteromedial portal was re-approximated with #3-0 nylon. A sterile dressing was applied followed by application of an ice wrap.  The patient tolerated the procedure well and was transported to the PACU in stable condition.  Wajiha Versteeg P. Holley Bouche., M.D.

## 2016-07-08 NOTE — Anesthesia Preprocedure Evaluation (Signed)
Anesthesia Evaluation  Patient identified by MRN, date of birth, ID band Patient awake    Reviewed: Allergy & Precautions, NPO status , Patient's Chart, lab work & pertinent test results, reviewed documented beta blocker date and time   Airway Mallampati: II  TM Distance: >3 FB     Dental  (+) Chipped   Pulmonary former smoker,           Cardiovascular hypertension, Pt. on medications      Neuro/Psych Anxiety    GI/Hepatic GERD  Controlled,  Endo/Other    Renal/GU      Musculoskeletal  (+) Arthritis ,   Abdominal   Peds  Hematology   Anesthesia Other Findings   Reproductive/Obstetrics                             Anesthesia Physical Anesthesia Plan  ASA: III  Anesthesia Plan: General   Post-op Pain Management:    Induction: Intravenous  Airway Management Planned: LMA  Additional Equipment:   Intra-op Plan:   Post-operative Plan:   Informed Consent: I have reviewed the patients History and Physical, chart, labs and discussed the procedure including the risks, benefits and alternatives for the proposed anesthesia with the patient or authorized representative who has indicated his/her understanding and acceptance.     Plan Discussed with: CRNA  Anesthesia Plan Comments:         Anesthesia Quick Evaluation

## 2016-07-08 NOTE — Anesthesia Post-op Follow-up Note (Cosign Needed)
Anesthesia QCDR form completed.        

## 2016-07-09 ENCOUNTER — Encounter: Payer: Self-pay | Admitting: Orthopedic Surgery

## 2016-07-16 DIAGNOSIS — Z9889 Other specified postprocedural states: Secondary | ICD-10-CM | POA: Insufficient documentation

## 2016-08-22 DIAGNOSIS — C4490 Unspecified malignant neoplasm of skin, unspecified: Secondary | ICD-10-CM

## 2016-08-22 HISTORY — DX: Unspecified malignant neoplasm of skin, unspecified: C44.90

## 2016-10-14 ENCOUNTER — Ambulatory Visit
Admission: RE | Admit: 2016-10-14 | Discharge: 2016-10-14 | Disposition: A | Discharge status: Home / Self Care | Payer: Medicare Other | Source: Ambulatory Visit | Attending: Family Medicine | Admitting: Family Medicine

## 2016-10-14 ENCOUNTER — Other Ambulatory Visit: Payer: Self-pay | Admitting: Family Medicine

## 2016-10-14 DIAGNOSIS — R600 Localized edema: Secondary | ICD-10-CM | POA: Diagnosis not present

## 2017-01-31 ENCOUNTER — Ambulatory Visit: Payer: Medicare Other

## 2017-01-31 ENCOUNTER — Ambulatory Visit
Admission: EM | Admit: 2017-01-31 | Discharge: 2017-01-31 | Disposition: A | Discharge status: Home / Self Care | Payer: Medicare Other | Attending: Family Medicine | Admitting: Family Medicine

## 2017-01-31 DIAGNOSIS — N400 Enlarged prostate without lower urinary tract symptoms: Secondary | ICD-10-CM | POA: Diagnosis not present

## 2017-01-31 DIAGNOSIS — Z87891 Personal history of nicotine dependence: Secondary | ICD-10-CM | POA: Diagnosis not present

## 2017-01-31 DIAGNOSIS — H9190 Unspecified hearing loss, unspecified ear: Secondary | ICD-10-CM | POA: Diagnosis not present

## 2017-01-31 DIAGNOSIS — I10 Essential (primary) hypertension: Secondary | ICD-10-CM | POA: Insufficient documentation

## 2017-01-31 DIAGNOSIS — M25551 Pain in right hip: Secondary | ICD-10-CM | POA: Insufficient documentation

## 2017-01-31 DIAGNOSIS — F419 Anxiety disorder, unspecified: Secondary | ICD-10-CM | POA: Diagnosis not present

## 2017-01-31 DIAGNOSIS — M1990 Unspecified osteoarthritis, unspecified site: Secondary | ICD-10-CM | POA: Insufficient documentation

## 2017-01-31 DIAGNOSIS — Z880 Allergy status to penicillin: Secondary | ICD-10-CM | POA: Diagnosis not present

## 2017-01-31 DIAGNOSIS — M7062 Trochanteric bursitis, left hip: Secondary | ICD-10-CM

## 2017-01-31 DIAGNOSIS — M25552 Pain in left hip: Secondary | ICD-10-CM | POA: Diagnosis not present

## 2017-01-31 DIAGNOSIS — Z79899 Other long term (current) drug therapy: Secondary | ICD-10-CM | POA: Insufficient documentation

## 2017-01-31 DIAGNOSIS — K219 Gastro-esophageal reflux disease without esophagitis: Secondary | ICD-10-CM | POA: Insufficient documentation

## 2017-01-31 DIAGNOSIS — Z888 Allergy status to other drugs, medicaments and biological substances status: Secondary | ICD-10-CM | POA: Insufficient documentation

## 2017-01-31 DIAGNOSIS — I700 Atherosclerosis of aorta: Secondary | ICD-10-CM | POA: Diagnosis not present

## 2017-01-31 DIAGNOSIS — E7800 Pure hypercholesterolemia, unspecified: Secondary | ICD-10-CM | POA: Insufficient documentation

## 2017-01-31 DIAGNOSIS — Z7982 Long term (current) use of aspirin: Secondary | ICD-10-CM | POA: Insufficient documentation

## 2017-01-31 DIAGNOSIS — M7061 Trochanteric bursitis, right hip: Secondary | ICD-10-CM

## 2017-01-31 NOTE — ED Provider Notes (Signed)
[facility]    CSN: [id number] Arrival date & time: 01/31/17  1055     History   Chief Complaint Chief Complaint  Patient presents with  . Hip Pain    HPI Billy Bennett. is a 83 y.o. male.   HPI   83 year old man presents with bilateral hip pain that started about 2 weeks ago. He states that his pain is lateral and hurts worse with walking. He states that he is not a side sleeper but sleeps mostly on his back. The pressure on his hips when lying down not severe. He has an orthopedic surgeon who has scheduled him for a knee injection that he will have on the 23rd. He did not notifiy the surgeon of his hip pain.He is using a walker at home and is currently ambulating with a cane.He has no back pain. He states that the pain will radiate over his distal thigh        Past Medical History:  Diagnosis Date  . Anxiety   . Arthritis   . Enlarged prostate   . GERD (gastroesophageal reflux disease)   . HOH (hard of hearing)   . Hypercholesteremia   . Hypertension     There are no active problems to display for this patient.   Past Surgical History:  Procedure Laterality Date  . ANAL FISSURE REPAIR    . CHOLECYSTECTOMY    . EYE SURGERY Bilateral    Cataract Extraction with IOL  . KNEE ARTHROSCOPY Right 07/08/2016   Procedure: ARTHROSCOPY KNEE, PARTIAL MEDIAL MENISECTOMY, CHONDROPLASTY;  Surgeon: Dereck Leep, MD;  Location: [facility] ORS;  Service: Orthopedics;  Laterality: Right;       Home Medications    Prior to Admission medications   Medication Sig Start Date End Date Taking? Authorizing Provider  aspirin EC 81 MG tablet Take 81 mg by mouth daily.   Yes [provider]  citalopram (CELEXA) 20 MG tablet Take 20 mg by mouth daily.  09/14/15  Yes [provider]  cloNIDine (CATAPRES) 0.1 MG tablet Take 0.1-0.2 mg by mouth 2 (two) times daily. 0.1 mg in the morning and 0.2 mg at night 09/14/15  Yes [provider]  diltiazem  (CARDIZEM CD) 240 MG 24 hr capsule Take 240 mg by mouth 2 (two) times daily. 09/30/15  Yes [provider]  finasteride (PROSCAR) 5 MG tablet Take 5 mg by mouth daily.   Yes [provider]  fluticasone (FLONASE) 50 MCG/ACT nasal spray Place 1 spray into both nostrils daily as needed for allergies.  09/19/15  Yes [provider]  hydrochlorothiazide (HYDRODIURIL) 25 MG tablet Take 25 mg by mouth daily.   Yes [provider]  lansoprazole (PREVACID) 15 MG capsule Take 15 mg by mouth at bedtime.    Yes [provider]  naproxen (NAPROSYN) 500 MG tablet Take 500 mg by mouth 2 (two) times daily. 09/27/15  Yes [provider]  Polyethyl Glycol-Propyl Glycol (SYSTANE) 0.4-0.3 % SOLN Place 1 drop into both eyes daily.   Yes [provider]  pravastatin (PRAVACHOL) 20 MG tablet Take 20 mg by mouth at bedtime.  09/30/15  Yes [provider]  psyllium (HYDROCIL/METAMUCIL) 95 % PACK Take 1 packet by mouth at bedtime as needed (for fiber/constipation.).   Yes [provider]  tamsulosin (FLOMAX) 0.4 MG CAPS capsule Take 0.4 mg by mouth daily. 09/14/15  Yes [provider]  trospium (SANCTURA) 20 MG tablet Take 20 mg by mouth  2 (two) times daily. 09/14/15  Yes [provider]  vitamin B-12 (CYANOCOBALAMIN) 500 MCG tablet Take 500 mcg by mouth daily.   Yes [provider]  vitamin E 400 UNIT capsule Take 400 Units by mouth daily.   Yes [provider]  HYDROcodone-acetaminophen (NORCO) 5-325 MG tablet Take 1-2 tablets by mouth every 4 (four) hours as needed for moderate pain. 07/08/16   Hooten, Laurice Record, MD    Family History History reviewed. No pertinent family history.  Social History Social History  Substance Use Topics  . Smoking status: Former Smoker    Types: Cigarettes    Quit date: 04/14/1977  . Smokeless tobacco: Never Used  . Alcohol use No     Allergies   Ace inhibitors;  Telmisartan-hctz; Amoxicillin-pot clavulanate; and Ibuprofen   Review of Systems Review of Systems  Constitutional: Positive for activity change. Negative for chills, fatigue and fever.  Musculoskeletal: Positive for arthralgias and gait problem.  All other systems reviewed and are negative.    Physical Exam Triage Vital Signs ED Triage Vitals  Enc Vitals Group     BP 01/31/17 1113 (!) 167/85     Pulse Rate 01/31/17 1113 88     Resp 01/31/17 1113 18     Temp 01/31/17 1113 97.9 F (36.6 C)     Temp Source 01/31/17 1113 Oral     SpO2 01/31/17 1113 98 %     Weight 01/31/17 1110 150 lb (68 kg)     Height --      Head Circumference --      Peak Flow --      Pain Score 01/31/17 1110 7     Pain Loc --      Pain Edu? --      Excl. in [place]? --    No data found.   Updated Vital Signs BP (!) 167/85 (BP Location: Left Arm)   Pulse 88   Temp 97.9 F (36.6 C) (Oral)   Resp 18   Wt 150 lb (68 kg)   SpO2 98%   BMI 24.96 kg/m   Visual Acuity Right Eye Distance:   Left Eye Distance:   Bilateral Distance:    Right Eye Near:   Left Eye Near:    Bilateral Near:     Physical Exam  Constitutional: He is oriented to person, place, and time. He appears well-developed and well-nourished. No distress.  HENT:  Head: Normocephalic.  Eyes: Pupils are equal, round, and reactive to light.  Neck: Normal range of motion.  Musculoskeletal: He exhibits tenderness.  Examination of the hips shows equal range of motion bilaterally. There is no pain with range of motion. His symptoms are reproduced with palpation of the greater trochanter of vertically superior and posteriorly. He has a shuffling gait. Does not Complain of pain while ambulating through the short distance from the chair to the table.  Neurological: He is alert and oriented to person, place, and time.  Skin: Skin is warm and dry. He is not diaphoretic.  Psychiatric: He has a normal mood and affect. His behavior is normal.  Judgment and thought content normal.  Nursing note and vitals reviewed.    UC Treatments / Results  Labs (all labs ordered are listed, but only abnormal results are displayed) Labs Reviewed - No data to display  EKG  EKG Interpretation None       Radiology No results found.  Procedures Procedures (including critical care time)  Medications Ordered in UC  Medications - No data to display   Initial Impression / Assessment and Plan / UC Course  I have reviewed the triage vital signs and the nursing notes.  Pertinent labs & imaging results that were available during my care of the patient were reviewed by me and considered in my medical decision making (see chart for details).     Plan: 1. Test/x-ray results and diagnosis reviewed with patient 2. rx as per orders; risks, benefits, potential side effects reviewed with patient 3. Recommend supportive treatment with use of walker or cane as necessary for ambulation. Patient refused oral medications. For his best option will likely be an injection into the trochanteric bursa. He is agreeable to this and wishes to return to his orthopedic surgeon for that procedure. Make an appointment for next week. In the meantime I have encouraged him to avoid symptoms as much as possible.He may use ice on the area 20 minutes out of every 2 hours 4-5 times daily. 4. 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 Not Applicable   Lorin Picket, PA-C 01/31/17 1311

## 2017-01-31 NOTE — Discharge Instructions (Signed)
Follow up with your orthopedic surgeon for possible injection of the bursa.You do not wish to take any oral medication so this may be your best option.

## 2017-01-31 NOTE — ED Triage Notes (Signed)
Patient complains of bilateral hip pain that started about 2 weeks ago. Patient states that hip pain is worse with walking.

## 2017-03-25 DIAGNOSIS — R011 Cardiac murmur, unspecified: Secondary | ICD-10-CM

## 2017-03-25 HISTORY — DX: Cardiac murmur, unspecified: R01.1

## 2017-07-01 DIAGNOSIS — K5909 Other constipation: Secondary | ICD-10-CM

## 2017-07-01 DIAGNOSIS — R151 Fecal smearing: Secondary | ICD-10-CM | POA: Insufficient documentation

## 2017-07-01 HISTORY — DX: Other constipation: K59.09

## 2017-08-28 ENCOUNTER — Other Ambulatory Visit: Payer: Self-pay

## 2017-08-28 DIAGNOSIS — Z9181 History of falling: Secondary | ICD-10-CM | POA: Insufficient documentation

## 2017-08-28 DIAGNOSIS — N400 Enlarged prostate without lower urinary tract symptoms: Secondary | ICD-10-CM

## 2017-08-29 ENCOUNTER — Encounter: Payer: Self-pay | Admitting: Urology

## 2017-08-29 ENCOUNTER — Ambulatory Visit (INDEPENDENT_AMBULATORY_CARE_PROVIDER_SITE_OTHER): Payer: Medicare Other | Admitting: Urology

## 2017-08-29 ENCOUNTER — Other Ambulatory Visit
Admission: RE | Admit: 2017-08-29 | Discharge: 2017-08-29 | Disposition: A | Discharge status: Home / Self Care | Payer: Medicare Other | Source: Ambulatory Visit | Attending: Urology | Admitting: Urology

## 2017-08-29 VITALS — BP 153/74 | HR 63 | Ht 64.0 in | Wt 140.0 lb

## 2017-08-29 DIAGNOSIS — N138 Other obstructive and reflux uropathy: Secondary | ICD-10-CM

## 2017-08-29 DIAGNOSIS — N3941 Urge incontinence: Secondary | ICD-10-CM | POA: Diagnosis not present

## 2017-08-29 DIAGNOSIS — N401 Enlarged prostate with lower urinary tract symptoms: Secondary | ICD-10-CM

## 2017-08-29 DIAGNOSIS — N402 Nodular prostate without lower urinary tract symptoms: Secondary | ICD-10-CM

## 2017-08-29 DIAGNOSIS — N400 Enlarged prostate without lower urinary tract symptoms: Secondary | ICD-10-CM | POA: Diagnosis present

## 2017-08-29 LAB — URINALYSIS, COMPLETE (UACMP) WITH MICROSCOPIC
BILIRUBIN URINE: NEGATIVE
Glucose, UA: NEGATIVE mg/dL
Hgb urine dipstick: NEGATIVE
Leukocytes, UA: NEGATIVE
NITRITE: NEGATIVE
RBC / HPF: NONE SEEN RBC/hpf (ref 0–5)
SPECIFIC GRAVITY, URINE: 1.02 (ref 1.005–1.030)
pH: 6.5 (ref 5.0–8.0)

## 2017-08-29 LAB — BLADDER SCAN AMB NON-IMAGING

## 2017-08-29 NOTE — Progress Notes (Signed)
08/29/2017 2:44 PM   Billy Bennett. 12/10/1923 [id number]  Referring provider: Gayland Curry, MD [address] [place], [place] [address]  Chief Complaint  Patient presents with  . Benign Prostatic Hypertrophy    New Patient    HPI: 84 year old male with history of BPH previously followed by Dr. Henderson Baltimore who returns to the office today to establish care.  He has a personal history of bowel and bladder incontinence the past few years.  He is on a diaper during this time.  He reports that he will the bathroom and have an episode of large-volume incontinence without even necessarily having the sensation of needing to void.  This occurs approximately once per day.  He is otherwise completely continent.  More recently, he is woken up in the nighttime after an episode of enuresis.  This is only going on over the past month.  This is new.  In addition to this, he has fecal incontinence.  He is seeing GI for this.  He does have a personal history of BPH and is on finasteride and Flomax chronically.  He was a difficult historian today with his medications but ultimately called his pharmacy to confirm that he has been filling a prescription for these medications.  It also appears that at some point he was on Sanctura but has not filled this prescription since March.  He cannot recall if and when he is placed on this medication and if it was helpful.  Other than the above, he reports he has a fairly decent stream is able to void as needed.  He feels like he empties his bladder for the most part.  Other than urge incontinence, he denies any frequency or urgency.  No gross hematuria or dysuria.  No recent UTIs.  PVR today 0 cc.  PMH: Past Medical History:  Diagnosis Date  . Anxiety   . Arthritis   . Enlarged prostate   . GERD (gastroesophageal reflux disease)   . Heart murmur, systolic 76/16/0737  . HOH (hard of hearing)   . Hypercholesteremia   . Hypertension   . Other  constipation 07/01/2017  . Pure hypercholesterolemia 04/01/2011  . Skin cancer 08/22/2016   Overview:  Patient sees Dr. Phillip Heal    Surgical History: Past Surgical History:  Procedure Laterality Date  . ANAL FISSURE REPAIR    . CHOLECYSTECTOMY    . EYE SURGERY Bilateral    Cataract Extraction with IOL  . KNEE ARTHROSCOPY Right 07/08/2016   Procedure: ARTHROSCOPY KNEE, PARTIAL MEDIAL MENISECTOMY, CHONDROPLASTY;  Surgeon: Dereck Leep, MD;  Location: [facility] ORS;  Service: Orthopedics;  Laterality: Right;    Home Medications:  Allergies as of 08/29/2017      Reactions   Ace Inhibitors Cough   Telmisartan-hctz Cough   Other Reaction: ARBS - COUGH   Amoxicillin-pot Clavulanate Diarrhea   Has patient had a PCN reaction causing immediate rash, facial/tongue/throat swelling, SOB or lightheadedness with hypotension:unsure Has patient had a PCN reaction causing severe rash involving mucus membranes or skin necrosis:unsure Has patient had a PCN reaction that required hospitalization:unsure  Has patient had a PCN reaction occurring within the last 10 years:Yes If all of the above answers are "NO", then may proceed with Cephalosporin use.   Ibuprofen Other (See Comments)   Ankle swelling ( can take naprosyn)      Medication List        Accurate as of 08/29/17 11:59 PM. Always use your most recent med list.  aspirin EC 81 MG tablet Take 81 mg by mouth daily.   citalopram 20 MG tablet Commonly known as:  CELEXA Take 20 mg by mouth daily.   cloNIDine 0.1 MG tablet Commonly known as:  CATAPRES Take 0.1-0.2 mg by mouth 2 (two) times daily. 0.1 mg in the morning and 0.2 mg at night   diltiazem 240 MG 24 hr capsule Commonly known as:  CARDIZEM CD Take 240 mg by mouth 2 (two) times daily.   finasteride 5 MG tablet Commonly known as:  PROSCAR Take 5 mg by mouth daily.   fluticasone 50 MCG/ACT nasal spray Commonly known as:  FLONASE Place 1 spray into both nostrils daily as  needed for allergies.   hydrochlorothiazide 25 MG tablet Commonly known as:  HYDRODIURIL Take 25 mg by mouth daily.   naproxen 500 MG tablet Commonly known as:  NAPROSYN Take 500 mg by mouth 2 (two) times daily.   pravastatin 20 MG tablet Commonly known as:  PRAVACHOL Take 20 mg by mouth at bedtime.   SYSTANE 0.4-0.3 % Soln Generic drug:  Polyethyl Glycol-Propyl Glycol Place 1 drop into both eyes daily.   tamsulosin 0.4 MG Caps capsule Commonly known as:  FLOMAX Take 0.4 mg by mouth.   trospium 20 MG tablet Commonly known as:  SANCTURA Take 20 mg by mouth 2 (two) times daily.   Zoster Vaccine Live (PF) 19400 UNT/0.65ML injection Commonly known as:  ZOSTAVAX Inject 0.65 mLs into the skin once.       Allergies:  Allergies  Allergen Reactions  . Ace Inhibitors Cough  . Telmisartan-Hctz Cough    Other Reaction: ARBS - COUGH  . Amoxicillin-Pot Clavulanate Diarrhea    Has patient had a PCN reaction causing immediate rash, facial/tongue/throat swelling, SOB or lightheadedness with hypotension:unsure Has patient had a PCN reaction causing severe rash involving mucus membranes or skin necrosis:unsure Has patient had a PCN reaction that required hospitalization:unsure  Has patient had a PCN reaction occurring within the last 10 years:Yes If all of the above answers are "NO", then may proceed with Cephalosporin use.   . Ibuprofen Other (See Comments)    Ankle swelling ( can take naprosyn)    Family History: Family History  Problem Relation Age of Onset  . Alzheimer's disease Father   . Prostate cancer Brother     Social History:  reports that he quit smoking about 40 years ago. His smoking use included cigarettes. He has never used smokeless tobacco. He reports that he does not drink alcohol or use drugs.  ROS: UROLOGY Frequent Urination?: No Hard to postpone urination?: No Burning/pain with urination?: No Get up at night to urinate?: Yes Leakage of urine?:  Yes Urine stream starts and stops?: No Trouble starting stream?: No Do you have to strain to urinate?: No Blood in urine?: No Urinary tract infection?: No Sexually transmitted disease?: No Injury to kidneys or bladder?: No Painful intercourse?: No Weak stream?: No Erection problems?: No Penile pain?: No  Gastrointestinal Nausea?: No Vomiting?: No Indigestion/heartburn?: No Diarrhea?: No Constipation?: No  Constitutional Fever: No Night sweats?: No Weight loss?: No Fatigue?: Yes  Skin Skin rash/lesions?: No Itching?: No  Eyes Blurred vision?: No Double vision?: No  Ears/Nose/Throat Sore throat?: No Sinus problems?: No  Hematologic/Lymphatic Swollen glands?: No Easy bruising?: Yes  Cardiovascular Leg swelling?: No Chest pain?: No  Respiratory Cough?: No Shortness of breath?: No  Endocrine Excessive thirst?: No  Musculoskeletal Back pain?: No Joint pain?: No  Neurological Headaches?: No Dizziness?:  No  Psychologic Depression?: No Anxiety?: No  Physical Exam: BP (!) 153/74   Pulse 63   Ht 5\' 4"  (1.626 m)   Wt 140 lb (63.5 kg)   BMI 24.03 kg/m   Constitutional:  Alert and oriented, No acute distress.  Elderly, frail.  In a wheelchair today but supposedly drove himself here. HEENT: [place] AT, moist mucus membranes.  Trachea midline, no masses. Cardiovascular: No clubbing, cyanosis, or edema. Respiratory: Normal respiratory effort, no increased work of breathing. GI: Abdomen is soft, nontender, nondistended, no abdominal masses GU: No CVA tenderness Recta: Slightly decrearsed pshincter tone, 50+ cc prostate with wide lateral lobes, questionable nodule on right lateral margin. Skin: No rashes, bruises or suspicious lesions. Neurologic: Grossly intact, no focal deficits, moving all 4 extremities. Psychiatric: Normal mood and affect.  Laboratory Data: Lab Results  Component Value Date   WBC 7.4 07/05/2016   HGB 13.0 07/05/2016   HCT 37.7 (L)  07/05/2016   MCV 87.8 07/05/2016   PLT 187 07/05/2016    Lab Results  Component Value Date   CREATININE 1.01 07/05/2016    UA/Pertinent Imaging: Results for orders placed or performed during the hospital encounter of 08/29/17  Urinalysis, Complete w Microscopic  Result Value Ref Range   Color, Urine YELLOW YELLOW   APPearance CLEAR CLEAR   Specific Gravity, Urine 1.020 1.005 - 1.030   pH 6.5 5.0 - 8.0   Glucose, UA NEGATIVE NEGATIVE mg/dL   Hgb urine dipstick NEGATIVE NEGATIVE   Bilirubin Urine NEGATIVE NEGATIVE   Ketones, ur TRACE (A) NEGATIVE mg/dL   Protein, ur TRACE (A) NEGATIVE mg/dL   Nitrite NEGATIVE NEGATIVE   Leukocytes, UA NEGATIVE NEGATIVE   Squamous Epithelial / LPF 0-5 0 - 5   WBC, UA 0-5 0 - 5 WBC/hpf   RBC / HPF NONE SEEN 0 - 5 RBC/hpf   Bacteria, UA RARE (A) NONE SEEN    PVR 0 cc.  Assessment & Plan:    1. Benign prostatic hyperplasia with urinary obstruction Continue maximal medical comanagement with Flomax/finasteride Adequate bladder emptying today - BLADDER SCAN AMB NON-IMAGING  2. Urge incontinence Daily episodes of urge incontinence which is chronic New nocturnal enuresis Previously on Sanctura with unknown effect Given age and memory issues, he is not a good candidate for anticholinergics Myrbetriq 25 mg samples given today x4 weeks Will return in 4 weeks to reassess with PVR  3. Prostate nodule Small nodule on exam today Given age and comorbidities, not a reasonable candidate for PSA screening   Return in about 1 month (around 09/26/2017) for PVR.  Hollice Espy, MD  [facility] [address], [place],  [address] [phone]

## 2017-10-10 ENCOUNTER — Encounter: Payer: Self-pay | Admitting: Urology

## 2017-10-10 ENCOUNTER — Ambulatory Visit (INDEPENDENT_AMBULATORY_CARE_PROVIDER_SITE_OTHER): Payer: Medicare Other | Admitting: Urology

## 2017-10-10 VITALS — BP 142/69 | HR 65 | Ht 65.0 in | Wt 145.0 lb

## 2017-10-10 DIAGNOSIS — N3944 Nocturnal enuresis: Secondary | ICD-10-CM

## 2017-10-10 DIAGNOSIS — N138 Other obstructive and reflux uropathy: Secondary | ICD-10-CM | POA: Diagnosis not present

## 2017-10-10 DIAGNOSIS — N401 Enlarged prostate with lower urinary tract symptoms: Secondary | ICD-10-CM

## 2017-10-10 LAB — BLADDER SCAN AMB NON-IMAGING

## 2017-10-10 NOTE — Progress Notes (Signed)
10/10/2017 3:30 PM   Billy Bennett [birth year] [id number]  Referring provider: Gayland Curry, MD [address] [place], [place] [address]  Chief Complaint  Patient presents with  . Benign Prostatic Hypertrophy    25month    HPI: 84 year old male with fecal and urinary incontinence he returns today for routine 1 month follow-up.  At last visit, he was given samples of Myrbetriq 25 mg x 4 weeks.  He returns today with follow-up residual.  He did take the medication daily and it seemed to make no difference in the amount of urinary leakage.  He continues to have incontinence to night time.  He has minimal daytime symptoms.    No dysuria or gross hematiuria.    He does have a dx of BPH on chronic flomax and finasteride.    Notably, he did have a small questionable nodule on prostate exam but given age and comorbidities, further work up/ treatment deferred.  No weightloss or bone pain does have a "bad back".   PMH: Past Medical History:  Diagnosis Date  . Anxiety   . Arthritis   . Enlarged prostate   . GERD (gastroesophageal reflux disease)   . Heart murmur, systolic 76/16/0737  . HOH (hard of hearing)   . Hypercholesteremia   . Hypertension   . Other constipation 07/01/2017  . Pure hypercholesterolemia 04/01/2011  . Skin cancer 08/22/2016   Overview:  Patient sees Dr. Phillip Heal    Surgical History: Past Surgical History:  Procedure Laterality Date  . ANAL FISSURE REPAIR    . CHOLECYSTECTOMY    . EYE SURGERY Bilateral    Cataract Extraction with IOL  . KNEE ARTHROSCOPY Right 07/08/2016   Procedure: ARTHROSCOPY KNEE, PARTIAL MEDIAL MENISECTOMY, CHONDROPLASTY;  Surgeon: Dereck Leep, MD;  Location: [facility] ORS;  Service: Orthopedics;  Laterality: Right;    Home Medications:  Allergies as of 10/10/2017      Reactions   Ace Inhibitors Cough   Telmisartan-hctz Cough   Other Reaction: ARBS - COUGH   Amoxicillin-pot Clavulanate Diarrhea   Has patient had a PCN  reaction causing immediate rash, facial/tongue/throat swelling, SOB or lightheadedness with hypotension:unsure Has patient had a PCN reaction causing severe rash involving mucus membranes or skin necrosis:unsure Has patient had a PCN reaction that required hospitalization:unsure  Has patient had a PCN reaction occurring within the last 10 years:Yes If all of the above answers are "NO", then may proceed with Cephalosporin use.   Ibuprofen Other (See Comments)   Ankle swelling ( can take naprosyn)      Medication List        Accurate as of 10/10/17  3:30 PM. Always use your most recent med list.          aspirin EC 81 MG tablet Take 81 mg by mouth daily.   citalopram 20 MG tablet Commonly known as:  CELEXA Take 20 mg by mouth daily.   cloNIDine 0.1 MG tablet Commonly known as:  CATAPRES Take 0.1-0.2 mg by mouth 2 (two) times daily. 0.1 mg in the morning and 0.2 mg at night   diltiazem 240 MG 24 hr capsule Commonly known as:  CARDIZEM CD Take 240 mg by mouth 2 (two) times daily.   finasteride 5 MG tablet Commonly known as:  PROSCAR Take 5 mg by mouth daily.   fluticasone 50 MCG/ACT nasal spray Commonly known as:  FLONASE Place 1 spray into both nostrils daily as needed for allergies.   hydrochlorothiazide 25 MG tablet  Commonly known as:  HYDRODIURIL Take 25 mg by mouth daily.   naproxen 500 MG tablet Commonly known as:  NAPROSYN Take 500 mg by mouth 2 (two) times daily.   pravastatin 20 MG tablet Commonly known as:  PRAVACHOL Take 20 mg by mouth at bedtime.   SYSTANE 0.4-0.3 % Soln Generic drug:  Polyethyl Glycol-Propyl Glycol Place 1 drop into both eyes daily.   tamsulosin 0.4 MG Caps capsule Commonly known as:  FLOMAX Take 0.4 mg by mouth.   trospium 20 MG tablet Commonly known as:  SANCTURA Take 20 mg by mouth 2 (two) times daily.   Zoster Vaccine Live (PF) 19400 UNT/0.65ML injection Commonly known as:  ZOSTAVAX Inject 0.65 mLs into the skin once.        Allergies:  Allergies  Allergen Reactions  . Ace Inhibitors Cough  . Telmisartan-Hctz Cough    Other Reaction: ARBS - COUGH  . Amoxicillin-Pot Clavulanate Diarrhea    Has patient had a PCN reaction causing immediate rash, facial/tongue/throat swelling, SOB or lightheadedness with hypotension:unsure Has patient had a PCN reaction causing severe rash involving mucus membranes or skin necrosis:unsure Has patient had a PCN reaction that required hospitalization:unsure  Has patient had a PCN reaction occurring within the last 10 years:Yes If all of the above answers are "NO", then may proceed with Cephalosporin use.   . Ibuprofen Other (See Comments)    Ankle swelling ( can take naprosyn)    Family History: Family History  Problem Relation Age of Onset  . Alzheimer's disease Father   . Prostate cancer Brother     Social History:  reports that he quit smoking about 40 years ago. His smoking use included cigarettes. He has never used smokeless tobacco. He reports that he does not drink alcohol or use drugs.  ROS: UROLOGY Frequent Urination?: No Hard to postpone urination?: No Burning/pain with urination?: No Get up at night to urinate?: No Leakage of urine?: No Urine stream starts and stops?: No Trouble starting stream?: No Do you have to strain to urinate?: No Blood in urine?: No Urinary tract infection?: No Sexually transmitted disease?: No Injury to kidneys or bladder?: No Painful intercourse?: No Weak stream?: No Erection problems?: No Penile pain?: No  Gastrointestinal Nausea?: No Vomiting?: No Indigestion/heartburn?: No Diarrhea?: No Constipation?: No  Constitutional Fever: No Night sweats?: No Weight loss?: No Fatigue?: No  Skin Skin rash/lesions?: No Itching?: No  Eyes Blurred vision?: No Double vision?: No  Ears/Nose/Throat Sore throat?: No Sinus problems?: No  Hematologic/Lymphatic Swollen glands?: No Easy bruising?:  No  Cardiovascular Leg swelling?: No Chest pain?: No  Respiratory Cough?: No Shortness of breath?: No  Endocrine Excessive thirst?: No  Musculoskeletal Back pain?: Yes Joint pain?: No  Neurological Headaches?: No Dizziness?: No  Psychologic Depression?: No Anxiety?: No  Physical Exam: BP (!) 142/69   Pulse 65   Ht 5\' 5"  (1.651 m)   Wt 145 lb (65.8 kg)   BMI 24.13 kg/m   Constitutional:  Alert and oriented, No acute distress.  Elderly, frail appearing, ambulating with walker. HEENT: [place] AT, moist mucus membranes.  Trachea midline, no masses. Cardiovascular: No clubbing, cyanosis, or edema. Respiratory: Normal respiratory effort, no increased work of breathing.. Skin: No rashes, bruises or suspicious lesions. Neurologic: Grossly intact, no focal deficits, moving all 4 extremities. Psychiatric: Normal mood and affect.  Laboratory Data: Lab Results  Component Value Date   WBC 7.4 07/05/2016   HGB 13.0 07/05/2016   HCT 37.7 (L) 07/05/2016  MCV 87.8 07/05/2016   PLT 187 07/05/2016    Lab Results  Component Value Date   CREATININE 1.01 07/05/2016   Urinalysis N/a  Pertinent Imaging: Results for orders placed or performed in visit on 10/10/17  BLADDER SCAN AMB NON-IMAGING  Result Value Ref Range   Scan Result 50ml     Assessment & Plan:    1. Nocturnal enuresis  No improvement with mybretriq 25 mg Will push dose to 50 mg, samples x 4 weeks given Adequate bladder emptying today Advised to call with result, will prescribe as needed Not currently taking sactura - BLADDER SCAN AMB NON-IMAGING  2. Benign prostatic hyperplasia with urinary obstruction Continue Flomax and finasteride Fairly decent control of daytime symptoms  He will call us after 1 month and let us know how its going with the higher dose of Myrbetriq, follow-up plan to be arranged based on results  Hollice Espy, MD  [place] [address] [place], [place] [address] [phone]

## 2017-11-10 ENCOUNTER — Telehealth: Payer: Self-pay | Admitting: Radiology

## 2017-11-10 NOTE — Telephone Encounter (Signed)
Patient left message to return his call but there was no answer & unable to [facility] due to mailbox was full.

## 2017-11-10 NOTE — Telephone Encounter (Signed)
Patient states Myrbetriq has not helped with urinary incontinence symptoms. Asks for return call at [phone].

## 2017-11-11 ENCOUNTER — Telehealth: Payer: Self-pay | Admitting: Urology

## 2017-11-11 NOTE — Telephone Encounter (Signed)
I tried calling pt, he could not hear me, even when practically yelling through the phone. I then called and left a message for Billy Bennett to call our office to discuss.

## 2017-11-11 NOTE — Telephone Encounter (Signed)
Let us try him on samples of Myrbetriq 50 mg.  Please have him stop by the office to pick up 4 weeks of samples.  Hollice Espy, MD

## 2017-11-11 NOTE — Telephone Encounter (Signed)
Pt called this afternoon and I informed him we would have 4 wks of Myrbetriq for him at front desk.  He said he would be by Wednesday or Thursday to pick them up.

## 2017-11-12 ENCOUNTER — Telehealth: Payer: Self-pay | Admitting: Urology

## 2017-11-12 NOTE — Telephone Encounter (Signed)
Patient went to [facility] to pick this up today but I explained that he would either have to come to [facility] or wait until Friday and we would take it to [facility]. However there was no medication up front to be picked up. Can someone get this together and have it sent to [facility] with who ever goes on Friday?   Sharyn Lull

## 2017-12-30 ENCOUNTER — Telehealth: Payer: Self-pay | Admitting: Urology

## 2017-12-30 NOTE — Telephone Encounter (Signed)
Pt is still having the same problems that he had before.  He asked for Billy Bennett to call him.  Can someone please give pt a call?

## 2017-12-31 NOTE — Telephone Encounter (Signed)
Patient is still having leakage and states that the Myrbetriq is not helping. Can he try something else or should he make an apt to discuss?

## 2017-12-31 NOTE — Telephone Encounter (Signed)
Please make him an appointment to discuss.  He can see me or Larene Beach.  Hollice Espy, MD

## 2018-01-01 NOTE — Telephone Encounter (Signed)
Patient has been scheduled and notified  Sharyn Lull

## 2018-01-09 ENCOUNTER — Ambulatory Visit (INDEPENDENT_AMBULATORY_CARE_PROVIDER_SITE_OTHER): Payer: Medicare Other | Admitting: Urology

## 2018-01-09 ENCOUNTER — Encounter: Payer: Self-pay | Admitting: Urology

## 2018-01-09 VITALS — BP 142/64 | HR 64 | Ht 66.0 in | Wt 140.0 lb

## 2018-01-09 DIAGNOSIS — N3944 Nocturnal enuresis: Secondary | ICD-10-CM

## 2018-01-09 DIAGNOSIS — N401 Enlarged prostate with lower urinary tract symptoms: Secondary | ICD-10-CM | POA: Diagnosis not present

## 2018-01-09 DIAGNOSIS — N138 Other obstructive and reflux uropathy: Secondary | ICD-10-CM | POA: Diagnosis not present

## 2018-01-09 NOTE — Progress Notes (Signed)
01/09/2018 2:07 PM   Billy Bennett 1923-07-11 [id number]  Referring provider: Gayland Curry, MD [address] [place], [place] [address]  Chief Complaint  Patient presents with  . Urinary Incontinence    follow up    HPI: 84 year old male who returns today primarily with chronic nocturnal enuresis and fecal incontinence for follow-up.  She also has some mild daytime symptoms but less significant.  He has been seen and evaluated several times over the last 6 months, see notes.  Given his age and comorbidities, is not a candidate for anticholinergics.  He was tried on Myrbetriq 25 mg with minimal response and titrated up to 50 mg with minimal to no improvement in his symptoms.  He needs to wake up wet in his diaper.  In the daytime, he is essentially dry but does wear a diaper just in case.  He does have some daytime frequency but is not bothered by this.  He does have a history of BPH on chronic Flomax and finasteride.  Up until recently, he was driving himself.  He now has a driver to his appointments.   PMH: Past Medical History:  Diagnosis Date  . Anxiety   . Arthritis   . Enlarged prostate   . GERD (gastroesophageal reflux disease)   . Heart murmur, systolic 64/33/2951  . HOH (hard of hearing)   . Hypercholesteremia   . Hypertension   . Other constipation 07/01/2017  . Pure hypercholesterolemia 04/01/2011  . Skin cancer 08/22/2016   Overview:  Patient sees Dr. Phillip Heal    Surgical History: Past Surgical History:  Procedure Laterality Date  . ANAL FISSURE REPAIR    . CHOLECYSTECTOMY    . EYE SURGERY Bilateral    Cataract Extraction with IOL  . KNEE ARTHROSCOPY Right 07/08/2016   Procedure: ARTHROSCOPY KNEE, PARTIAL MEDIAL MENISECTOMY, CHONDROPLASTY;  Surgeon: Dereck Leep, MD;  Location: [facility] ORS;  Service: Orthopedics;  Laterality: Right;    Home Medications:  Allergies as of 01/09/2018      Reactions   Ace Inhibitors Cough   Telmisartan-hctz  Cough   Other Reaction: ARBS - COUGH   Amoxicillin-pot Clavulanate Diarrhea   Has patient had a PCN reaction causing immediate rash, facial/tongue/throat swelling, SOB or lightheadedness with hypotension:unsure Has patient had a PCN reaction causing severe rash involving mucus membranes or skin necrosis:unsure Has patient had a PCN reaction that required hospitalization:unsure  Has patient had a PCN reaction occurring within the last 10 years:Yes If all of the above answers are "NO", then may proceed with Cephalosporin use.   Ibuprofen Other (See Comments)   Ankle swelling ( can take naprosyn)      Medication List        Accurate as of 01/09/18  2:07 PM. Always use your most recent med list.          aspirin EC 81 MG tablet Take 81 mg by mouth daily.   citalopram 20 MG tablet Commonly known as:  CELEXA Take 20 mg by mouth daily.   cloNIDine 0.1 MG tablet Commonly known as:  CATAPRES Take 0.1-0.2 mg by mouth 2 (two) times daily. 0.1 mg in the morning and 0.2 mg at night   diltiazem 240 MG 24 hr capsule Commonly known as:  CARDIZEM CD Take 240 mg by mouth 2 (two) times daily.   finasteride 5 MG tablet Commonly known as:  PROSCAR Take 5 mg by mouth daily.   fluticasone 50 MCG/ACT nasal spray Commonly known as:  FLONASE Place 1 spray into both nostrils daily as needed for allergies.   hydrochlorothiazide 25 MG tablet Commonly known as:  HYDRODIURIL Take 25 mg by mouth daily.   naproxen 500 MG tablet Commonly known as:  NAPROSYN Take 500 mg by mouth 2 (two) times daily.   pravastatin 20 MG tablet Commonly known as:  PRAVACHOL Take 20 mg by mouth at bedtime.   SYSTANE 0.4-0.3 % Soln Generic drug:  Polyethyl Glycol-Propyl Glycol Place 1 drop into both eyes daily.   tamsulosin 0.4 MG Caps capsule Commonly known as:  FLOMAX Take 0.4 mg by mouth.   Zoster Vaccine Live (PF) 19400 UNT/0.65ML injection Commonly known as:  ZOSTAVAX Inject 0.65 mLs into the skin  once.       Allergies:  Allergies  Allergen Reactions  . Ace Inhibitors Cough  . Telmisartan-Hctz Cough    Other Reaction: ARBS - COUGH  . Amoxicillin-Pot Clavulanate Diarrhea    Has patient had a PCN reaction causing immediate rash, facial/tongue/throat swelling, SOB or lightheadedness with hypotension:unsure Has patient had a PCN reaction causing severe rash involving mucus membranes or skin necrosis:unsure Has patient had a PCN reaction that required hospitalization:unsure  Has patient had a PCN reaction occurring within the last 10 years:Yes If all of the above answers are "NO", then may proceed with Cephalosporin use.   . Ibuprofen Other (See Comments)    Ankle swelling ( can take naprosyn)    Family History: Family History  Problem Relation Age of Onset  . Alzheimer's disease Father   . Prostate cancer Brother     Social History:  reports that he quit smoking about 40 years ago. His smoking use included cigarettes. He has never used smokeless tobacco. He reports that he does not drink alcohol or use drugs.  ROS: UROLOGY Frequent Urination?: No Hard to postpone urination?: No Burning/pain with urination?: No Get up at night to urinate?: No Leakage of urine?: No Urine stream starts and stops?: Yes Trouble starting stream?: No Do you have to strain to urinate?: No Blood in urine?: No Urinary tract infection?: No Sexually transmitted disease?: No Injury to kidneys or bladder?: No Painful intercourse?: No Weak stream?: No Erection problems?: No Penile pain?: No  Gastrointestinal Nausea?: No Vomiting?: No Indigestion/heartburn?: No Diarrhea?: No Constipation?: No  Constitutional Fever: No Night sweats?: No Weight loss?: No Fatigue?: No  Skin Skin rash/lesions?: No Itching?: No  Eyes Blurred vision?: No Double vision?: No  Ears/Nose/Throat Sore throat?: No Sinus problems?: No  Hematologic/Lymphatic Swollen glands?: No Easy bruising?:  No  Cardiovascular Leg swelling?: No Chest pain?: No  Respiratory Cough?: No Shortness of breath?: No  Endocrine Excessive thirst?: No  Musculoskeletal Back pain?: No Joint pain?: No  Neurological Headaches?: No Dizziness?: No  Psychologic Depression?: No Anxiety?: No  Physical Exam: BP (!) 142/64   Pulse 64   Ht 5\' 6"  (1.676 m)   Wt 140 lb (63.5 kg)   BMI 22.60 kg/m   Constitutional:  Alert and oriented, No acute distress.  Elderly, ambulating with cane. HEENT: [place] AT, moist mucus membranes.  Trachea midline, no masses. Cardiovascular: No clubbing, cyanosis, or edema. MSK: Significant kyphosis Skin: No rashes, bruises or suspicious lesions. Neurologic: Grossly intact, no focal deficits, moving all 4 extremities. Psychiatric: Normal mood and affect.  Laboratory Data: Lab Results  Component Value Date   WBC 7.4 07/05/2016   HGB 13.0 07/05/2016   HCT 37.7 (L) 07/05/2016   MCV 87.8 07/05/2016   PLT 187 07/05/2016  Lab Results  Component Value Date   CREATININE 1.01 07/05/2016    Urinalysis UA previously negative  Pertinent Imaging: PVR previously 0   Assessment & Plan:    1. Nocturnal enuresis Failed mybetriq 50 mg Unfortunately, given his age I am hesitant to start him on any anticholinergics We discussed alternatives today including DDAVP but do have some concerns about metabolic issues We also discussed PTNS and Botox He is concerned of urinary retention with Botox and would prefer to avoid this.  He has transportation issues with PTNS.  At this point in time, options are fairly limited.  He understands this and is willing to accept that this is his new baseline.   No concern for overflow incontinence as per previous notes  2. Benign prostatic hyperplasia with urinary obstruction Continue flomax and finasteride Good daytime symptom control  F/u prn  Hollice Espy, MD  [place] [address] [place], [place] [address] [phone]

## 2018-01-11 ENCOUNTER — Encounter: Payer: Self-pay | Admitting: Urology

## 2018-03-03 ENCOUNTER — Other Ambulatory Visit: Payer: Self-pay | Admitting: Family Medicine

## 2018-03-03 DIAGNOSIS — R413 Other amnesia: Secondary | ICD-10-CM

## 2018-04-07 ENCOUNTER — Ambulatory Visit
Admission: RE | Admit: 2018-04-07 | Discharge: 2018-04-07 | Disposition: A | Discharge status: Home / Self Care | Payer: Medicare Other | Source: Ambulatory Visit | Attending: Family Medicine | Admitting: Family Medicine

## 2018-04-07 DIAGNOSIS — G319 Degenerative disease of nervous system, unspecified: Secondary | ICD-10-CM | POA: Insufficient documentation

## 2018-04-07 DIAGNOSIS — R413 Other amnesia: Secondary | ICD-10-CM | POA: Insufficient documentation

## 2018-04-07 DIAGNOSIS — B9689 Other specified bacterial agents as the cause of diseases classified elsewhere: Secondary | ICD-10-CM | POA: Diagnosis not present

## 2018-04-07 DIAGNOSIS — I6782 Cerebral ischemia: Secondary | ICD-10-CM | POA: Insufficient documentation

## 2018-04-07 DIAGNOSIS — J320 Chronic maxillary sinusitis: Secondary | ICD-10-CM | POA: Insufficient documentation

## 2019-02-27 ENCOUNTER — Emergency Department: Payer: Medicare Other

## 2019-02-27 ENCOUNTER — Inpatient Hospital Stay: Payer: Medicare Other

## 2019-02-27 ENCOUNTER — Inpatient Hospital Stay
Admission: EM | Admit: 2019-02-27 | Discharge: 2019-03-03 | DRG: 064 | Disposition: A | Discharge status: Skilled Nursing Facility | Payer: Medicare Other | Attending: Internal Medicine | Admitting: Internal Medicine

## 2019-02-27 ENCOUNTER — Encounter: Payer: Self-pay | Admitting: Emergency Medicine

## 2019-02-27 DIAGNOSIS — E785 Hyperlipidemia, unspecified: Secondary | ICD-10-CM | POA: Diagnosis present

## 2019-02-27 DIAGNOSIS — Z9049 Acquired absence of other specified parts of digestive tract: Secondary | ICD-10-CM

## 2019-02-27 DIAGNOSIS — I340 Nonrheumatic mitral (valve) insufficiency: Secondary | ICD-10-CM | POA: Diagnosis not present

## 2019-02-27 DIAGNOSIS — Z9181 History of falling: Secondary | ICD-10-CM

## 2019-02-27 DIAGNOSIS — Z87891 Personal history of nicotine dependence: Secondary | ICD-10-CM | POA: Diagnosis not present

## 2019-02-27 DIAGNOSIS — Z20828 Contact with and (suspected) exposure to other viral communicable diseases: Secondary | ICD-10-CM | POA: Diagnosis present

## 2019-02-27 DIAGNOSIS — R7989 Other specified abnormal findings of blood chemistry: Secondary | ICD-10-CM | POA: Diagnosis present

## 2019-02-27 DIAGNOSIS — E876 Hypokalemia: Secondary | ICD-10-CM | POA: Diagnosis present

## 2019-02-27 DIAGNOSIS — I619 Nontraumatic intracerebral hemorrhage, unspecified: Secondary | ICD-10-CM

## 2019-02-27 DIAGNOSIS — Z9842 Cataract extraction status, left eye: Secondary | ICD-10-CM

## 2019-02-27 DIAGNOSIS — S81811A Laceration without foreign body, right lower leg, initial encounter: Secondary | ICD-10-CM | POA: Diagnosis present

## 2019-02-27 DIAGNOSIS — I350 Nonrheumatic aortic (valve) stenosis: Secondary | ICD-10-CM | POA: Diagnosis not present

## 2019-02-27 DIAGNOSIS — Z961 Presence of intraocular lens: Secondary | ICD-10-CM | POA: Diagnosis present

## 2019-02-27 DIAGNOSIS — W1830XA Fall on same level, unspecified, initial encounter: Secondary | ICD-10-CM | POA: Diagnosis present

## 2019-02-27 DIAGNOSIS — Z85828 Personal history of other malignant neoplasm of skin: Secondary | ICD-10-CM | POA: Diagnosis not present

## 2019-02-27 DIAGNOSIS — E860 Dehydration: Secondary | ICD-10-CM | POA: Diagnosis present

## 2019-02-27 DIAGNOSIS — R4182 Altered mental status, unspecified: Secondary | ICD-10-CM | POA: Diagnosis present

## 2019-02-27 DIAGNOSIS — T796XXA Traumatic ischemia of muscle, initial encounter: Secondary | ICD-10-CM | POA: Diagnosis not present

## 2019-02-27 DIAGNOSIS — G9341 Metabolic encephalopathy: Secondary | ICD-10-CM | POA: Diagnosis present

## 2019-02-27 DIAGNOSIS — R011 Cardiac murmur, unspecified: Secondary | ICD-10-CM | POA: Diagnosis present

## 2019-02-27 DIAGNOSIS — Z66 Do not resuscitate: Secondary | ICD-10-CM | POA: Diagnosis present

## 2019-02-27 DIAGNOSIS — F419 Anxiety disorder, unspecified: Secondary | ICD-10-CM | POA: Diagnosis present

## 2019-02-27 DIAGNOSIS — Z886 Allergy status to analgesic agent status: Secondary | ICD-10-CM

## 2019-02-27 DIAGNOSIS — K219 Gastro-esophageal reflux disease without esophagitis: Secondary | ICD-10-CM | POA: Diagnosis present

## 2019-02-27 DIAGNOSIS — G9340 Encephalopathy, unspecified: Secondary | ICD-10-CM

## 2019-02-27 DIAGNOSIS — S0990XA Unspecified injury of head, initial encounter: Secondary | ICD-10-CM

## 2019-02-27 DIAGNOSIS — Z79899 Other long term (current) drug therapy: Secondary | ICD-10-CM

## 2019-02-27 DIAGNOSIS — I629 Nontraumatic intracranial hemorrhage, unspecified: Secondary | ICD-10-CM | POA: Diagnosis present

## 2019-02-27 DIAGNOSIS — R778 Other specified abnormalities of plasma proteins: Secondary | ICD-10-CM | POA: Diagnosis not present

## 2019-02-27 DIAGNOSIS — W19XXXA Unspecified fall, initial encounter: Secondary | ICD-10-CM | POA: Diagnosis not present

## 2019-02-27 DIAGNOSIS — R1310 Dysphagia, unspecified: Secondary | ICD-10-CM | POA: Diagnosis present

## 2019-02-27 DIAGNOSIS — H9190 Unspecified hearing loss, unspecified ear: Secondary | ICD-10-CM | POA: Diagnosis present

## 2019-02-27 DIAGNOSIS — I10 Essential (primary) hypertension: Secondary | ICD-10-CM | POA: Diagnosis present

## 2019-02-27 DIAGNOSIS — T796XXD Traumatic ischemia of muscle, subsequent encounter: Secondary | ICD-10-CM | POA: Diagnosis not present

## 2019-02-27 DIAGNOSIS — M6282 Rhabdomyolysis: Secondary | ICD-10-CM | POA: Diagnosis present

## 2019-02-27 DIAGNOSIS — Z7982 Long term (current) use of aspirin: Secondary | ICD-10-CM

## 2019-02-27 DIAGNOSIS — Y92003 Bedroom of unspecified non-institutional (private) residence as the place of occurrence of the external cause: Secondary | ICD-10-CM | POA: Diagnosis not present

## 2019-02-27 DIAGNOSIS — F0390 Unspecified dementia without behavioral disturbance: Secondary | ICD-10-CM | POA: Diagnosis present

## 2019-02-27 DIAGNOSIS — Z8042 Family history of malignant neoplasm of prostate: Secondary | ICD-10-CM

## 2019-02-27 DIAGNOSIS — N400 Enlarged prostate without lower urinary tract symptoms: Secondary | ICD-10-CM | POA: Diagnosis present

## 2019-02-27 DIAGNOSIS — Z820 Family history of epilepsy and other diseases of the nervous system: Secondary | ICD-10-CM

## 2019-02-27 DIAGNOSIS — Z880 Allergy status to penicillin: Secondary | ICD-10-CM

## 2019-02-27 DIAGNOSIS — Z888 Allergy status to other drugs, medicaments and biological substances status: Secondary | ICD-10-CM

## 2019-02-27 DIAGNOSIS — Z9841 Cataract extraction status, right eye: Secondary | ICD-10-CM

## 2019-02-27 LAB — TROPONIN I (HIGH SENSITIVITY)
Troponin I (High Sensitivity): 49 ng/L — ABNORMAL HIGH (ref <18)
Troponin I (High Sensitivity): 51 ng/L — ABNORMAL HIGH (ref <18)
Troponin I (High Sensitivity): 63 ng/L — ABNORMAL HIGH (ref <18)

## 2019-02-27 LAB — URINALYSIS, COMPLETE (UACMP) WITH MICROSCOPIC
Bacteria, UA: NONE SEEN
Bilirubin Urine: NEGATIVE
Glucose, UA: 50 mg/dL — AB
Ketones, ur: 5 mg/dL — AB
Leukocytes,Ua: NEGATIVE
Nitrite: NEGATIVE
Protein, ur: 100 mg/dL — AB
Specific Gravity, Urine: 1.035 — ABNORMAL HIGH (ref 1.005–1.030)
Squamous Epithelial / LPF: NONE SEEN (ref 0–5)
pH: 7 (ref 5.0–8.0)

## 2019-02-27 LAB — COMPREHENSIVE METABOLIC PANEL
ALT: 31 U/L (ref 0–44)
AST: 53 U/L — ABNORMAL HIGH (ref 15–41)
Albumin: 4.1 g/dL (ref 3.5–5.0)
Alkaline Phosphatase: 81 U/L (ref 38–126)
Anion gap: 9 (ref 5–15)
BUN: 19 mg/dL (ref 8–23)
CO2: 27 mmol/L (ref 22–32)
Calcium: 9 mg/dL (ref 8.9–10.3)
Chloride: 105 mmol/L (ref 98–111)
Creatinine, Ser: 0.72 mg/dL (ref 0.61–1.24)
GFR calc Af Amer: >60 mL/min (ref >60)
GFR calc non Af Amer: >60 mL/min (ref >60)
Glucose, Bld: 135 mg/dL — ABNORMAL HIGH (ref 70–99)
Potassium: 2.9 mmol/L — ABNORMAL LOW (ref 3.5–5.1)
Sodium: 141 mmol/L (ref 135–145)
Total Bilirubin: 1 mg/dL (ref 0.3–1.2)
Total Protein: 7.1 g/dL (ref 6.5–8.1)

## 2019-02-27 LAB — CBC WITH DIFFERENTIAL/PLATELET
Abs Immature Granulocytes: 0.08 10*3/uL — ABNORMAL HIGH (ref 0.00–0.07)
Basophils Absolute: 0.1 10*3/uL (ref 0.0–0.1)
Basophils Relative: 0 %
Eosinophils Absolute: 0 10*3/uL (ref 0.0–0.5)
Eosinophils Relative: 0 %
HCT: 41.2 % (ref 39.0–52.0)
Hemoglobin: 14.1 g/dL (ref 13.0–17.0)
Immature Granulocytes: 1 %
Lymphocytes Relative: 5 %
Lymphs Abs: 0.8 10*3/uL (ref 0.7–4.0)
MCH: 29.1 pg (ref 26.0–34.0)
MCHC: 34.2 g/dL (ref 30.0–36.0)
MCV: 85.1 fL (ref 80.0–100.0)
Monocytes Absolute: 0.8 10*3/uL (ref 0.1–1.0)
Monocytes Relative: 5 %
Neutro Abs: 13.8 10*3/uL — ABNORMAL HIGH (ref 1.7–7.7)
Neutrophils Relative %: 89 %
Platelets: 211 10*3/uL (ref 150–400)
RBC: 4.84 MIL/uL (ref 4.22–5.81)
RDW: 14.3 % (ref 11.5–15.5)
WBC: 15.5 10*3/uL — ABNORMAL HIGH (ref 4.0–10.5)
nRBC: 0 % (ref 0.0–0.2)

## 2019-02-27 LAB — FOLATE: Folate: 46 ng/mL (ref >5.9)

## 2019-02-27 LAB — RAPID HIV SCREEN (HIV 1/2 AB+AG)
HIV 1/2 Antibodies: NONREACTIVE
HIV-1 P24 Antigen - HIV24: NONREACTIVE

## 2019-02-27 LAB — FIBRIN DERIVATIVES D-DIMER (ARMC ONLY): Fibrin derivatives D-dimer (ARMC): 1528.81 ng/mL (FEU) — ABNORMAL HIGH (ref 0.00–499.00)

## 2019-02-27 LAB — TSH: TSH: 0.735 u[IU]/mL (ref 0.350–4.500)

## 2019-02-27 LAB — VITAMIN B12: Vitamin B-12: 886 pg/mL (ref 180–914)

## 2019-02-27 LAB — SARS CORONAVIRUS 2 (TAT 6-24 HRS): SARS Coronavirus 2: NEGATIVE

## 2019-02-27 LAB — PHOSPHORUS: Phosphorus: 3.2 mg/dL (ref 2.5–4.6)

## 2019-02-27 LAB — T4, FREE: Free T4: 0.85 ng/dL (ref 0.61–1.12)

## 2019-02-27 LAB — MAGNESIUM: Magnesium: 1.6 mg/dL — ABNORMAL LOW (ref 1.7–2.4)

## 2019-02-27 LAB — CK: Total CK: 897 U/L — ABNORMAL HIGH (ref 49–397)

## 2019-02-27 MED ORDER — TAMSULOSIN HCL 0.4 MG PO CAPS
0.4000 mg | ORAL_CAPSULE | Freq: Every day | ORAL | Status: DC
  Administered 2019-02-28 – 2019-03-02 (×3): 0.4 mg via ORAL
  Filled 2019-02-27 (×4): qty 1

## 2019-02-27 MED ORDER — SERTRALINE HCL 25 MG PO TABS
25.0000 mg | ORAL_TABLET | Freq: Every day | ORAL | Status: DC
  Administered 2019-03-01 – 2019-03-03 (×3): 25 mg via ORAL
  Filled 2019-02-27 (×2): qty 1
  Filled 2019-02-27: qty 0.5
  Filled 2019-02-27 (×2): qty 1

## 2019-02-27 MED ORDER — POTASSIUM CHLORIDE 10 MEQ/100ML IV SOLN
10.0000 meq | INTRAVENOUS | Status: AC
  Administered 2019-02-27 (×2): 10 meq via INTRAVENOUS
  Filled 2019-02-27 (×2): qty 100

## 2019-02-27 MED ORDER — MAGNESIUM SULFATE IN D5W 1-5 GM/100ML-% IV SOLN
1.0000 g | Freq: Once | INTRAVENOUS | Status: AC
  Administered 2019-02-27: 1 g via INTRAVENOUS
  Filled 2019-02-27: qty 100

## 2019-02-27 MED ORDER — HYDRALAZINE HCL 20 MG/ML IJ SOLN
INTRAMUSCULAR | Status: AC
  Administered 2019-02-27: 10 mg via INTRAVENOUS
  Filled 2019-02-27: qty 1

## 2019-02-27 MED ORDER — ASPIRIN EC 81 MG PO TBEC
81.0000 mg | DELAYED_RELEASE_TABLET | Freq: Every day | ORAL | Status: DC
  Filled 2019-02-27: qty 1

## 2019-02-27 MED ORDER — LACTATED RINGERS IV SOLN
INTRAVENOUS | Status: DC
  Administered 2019-02-27 (×2): via INTRAVENOUS

## 2019-02-27 MED ORDER — MAGNESIUM SULFATE 50 % IJ SOLN
1.0000 g | Freq: Once | INTRAMUSCULAR | Status: DC
  Filled 2019-02-27 (×2): qty 2

## 2019-02-27 MED ORDER — FINASTERIDE 5 MG PO TABS
5.0000 mg | ORAL_TABLET | Freq: Every day | ORAL | Status: DC
  Filled 2019-02-27: qty 1

## 2019-02-27 MED ORDER — HEPARIN SODIUM (PORCINE) 5000 UNIT/ML IJ SOLN: 5000.0000 [IU] | Freq: Three times a day (TID) | INTRAMUSCULAR | Status: DC

## 2019-02-27 MED ORDER — METOPROLOL TARTRATE 5 MG/5ML IV SOLN: 2.5000 mg | Freq: Four times a day (QID) | INTRAVENOUS | Status: DC

## 2019-02-27 MED ORDER — DONEPEZIL HCL 5 MG PO TABS
5.0000 mg | ORAL_TABLET | Freq: Every day | ORAL | Status: DC
  Filled 2019-02-27: qty 1

## 2019-02-27 MED ORDER — METOPROLOL TARTRATE 5 MG/5ML IV SOLN
5.0000 mg | Freq: Four times a day (QID) | INTRAVENOUS | Status: DC
  Administered 2019-02-27: 5 mg via INTRAVENOUS
  Filled 2019-02-27: qty 5

## 2019-02-27 MED ORDER — HYDRALAZINE HCL 20 MG/ML IJ SOLN
10.0000 mg | Freq: Once | INTRAMUSCULAR | Status: AC
  Administered 2019-02-27: 13:00:00 10 mg via INTRAVENOUS

## 2019-02-27 MED ORDER — IOHEXOL 350 MG/ML SOLN
75.0000 mL | Freq: Once | INTRAVENOUS | Status: AC | PRN
  Administered 2019-02-27: 75 mL via INTRAVENOUS
  Filled 2019-02-27: qty 75

## 2019-02-27 MED ORDER — DILTIAZEM HCL ER COATED BEADS 180 MG PO CP24
180.0000 mg | ORAL_CAPSULE | Freq: Two times a day (BID) | ORAL | Status: DC
  Filled 2019-02-27 (×2): qty 1

## 2019-02-27 MED ORDER — HYDROCHLOROTHIAZIDE 25 MG PO TABS: 12.5000 mg | ORAL_TABLET | Freq: Every day | ORAL | Status: DC

## 2019-02-27 MED ORDER — HYDRALAZINE HCL 20 MG/ML IJ SOLN: 10.0000 mg | Freq: Once | INTRAMUSCULAR | Status: DC

## 2019-02-27 MED ORDER — PANTOPRAZOLE SODIUM 40 MG IV SOLR
40.0000 mg | INTRAVENOUS | Status: DC
  Administered 2019-02-27 – 2019-03-02 (×4): 40 mg via INTRAVENOUS
  Filled 2019-02-27 (×4): qty 40

## 2019-02-27 MED ORDER — SODIUM CHLORIDE 0.9 % IV SOLN
1000.0000 mL | Freq: Once | INTRAVENOUS | Status: AC
  Administered 2019-02-27: 1000 mL via INTRAVENOUS

## 2019-02-27 MED ORDER — HEPARIN SODIUM (PORCINE) 5000 UNIT/ML IJ SOLN
5000.0000 [IU] | Freq: Three times a day (TID) | INTRAMUSCULAR | Status: DC
  Administered 2019-02-27: 5000 [IU] via SUBCUTANEOUS
  Filled 2019-02-27 (×2): qty 1

## 2019-02-27 MED ORDER — SODIUM CHLORIDE 0.9% FLUSH
3.0000 mL | Freq: Two times a day (BID) | INTRAVENOUS | Status: DC
  Administered 2019-02-27 – 2019-03-03 (×5): 3 mL via INTRAVENOUS

## 2019-02-27 NOTE — ED Notes (Signed)
Attempted to call report to the floor, per secretary accepting RN is discharging a patient and will call me back

## 2019-02-27 NOTE — ED Notes (Signed)
Pt otf for imaging 

## 2019-02-27 NOTE — H&P (Addendum)
History and Physical    Billy Bennett. LR:[id number] DOB: June 30, 1923 DOA: 02/27/2019  PCP: Gayland Curry, MD (Confirm with patient/family/NH records and if not entered, this has to be entered at [facility] point of entry) Patient coming from: Home  I have personally briefly reviewed patient's old medical records in [place]  Chief Complaint: Acute metabolic encephalopathy  HPI: Billy Bennett. is a 85 y.o. male with medical history significant of hypertension, dementia, GERD, Seen in the emergency room for admission eval .  History is per ED provider note and chart review.  Patient is alert and awake however confused.   ED Course:  In the ED patient is cooperative, tries to follow command but is not oriented to person place or time. I do suspect that there is a fall involved, as he was found on the floor, next to his bed per the neighbor who came to check on him.  Initial vitals showed that patient was hypertensive with a blood pressure of 196/101, temperature of 97.4.  Labs showed a white count of 15.5, hypokalemia with a potassium of 2.9, AST elevation at 53, troponin of 49, COVID-19 test pending.  Review of Systems: Unable to obtain secondary to patient's mental status.  Past Medical History:  Diagnosis Date   Anxiety    Arthritis    Enlarged prostate    GERD (gastroesophageal reflux disease)    Heart murmur, systolic 123456   HOH (hard of hearing)    Hypercholesteremia    Hypertension    Other constipation 07/01/2017   Pure hypercholesterolemia 04/01/2011   Skin cancer 08/22/2016   Overview:  Patient sees Dr. Phillip Heal    Past Surgical History:  Procedure Laterality Date   ANAL FISSURE REPAIR     CHOLECYSTECTOMY     EYE SURGERY Bilateral    Cataract Extraction with IOL   KNEE ARTHROSCOPY Right 07/08/2016   Procedure: ARTHROSCOPY KNEE, PARTIAL MEDIAL MENISECTOMY, CHONDROPLASTY;  Surgeon: Dereck Leep, MD;  Location: [facility] ORS;  Service:  Orthopedics;  Laterality: Right;     reports that he quit smoking about 41 years ago. His smoking use included cigarettes. He has never used smokeless tobacco. He reports that he does not drink alcohol or use drugs.  Allergies  Allergen Reactions   Ace Inhibitors Cough   Telmisartan-Hctz Cough    Other Reaction: ARBS - COUGH   Amoxicillin-Pot Clavulanate Diarrhea    Has patient had a PCN reaction causing immediate rash, facial/tongue/throat swelling, SOB or lightheadedness with hypotension:unsure Has patient had a PCN reaction causing severe rash involving mucus membranes or skin necrosis:unsure Has patient had a PCN reaction that required hospitalization:unsure  Has patient had a PCN reaction occurring within the last 10 years:Yes If all of the above answers are "NO", then may proceed with Cephalosporin use.    Ibuprofen Other (See Comments)    Ankle swelling ( can take naprosyn)    Family History  Problem Relation Age of Onset   Alzheimer's disease Father    Prostate cancer Brother    Unacceptable: Noncontributory, unremarkable, or negative. Acceptable: Family history reviewed and not pertinent (If you reviewed it)  Prior to Admission medications   Medication Sig Start Date End Date Taking? Authorizing Provider  naproxen (NAPROSYN) 500 MG tablet Take 500 mg by mouth daily. 12/15/18  Yes [provider]  omeprazole (PRILOSEC) 20 MG capsule Take 20 mg by mouth 2 (two) times daily. 10/02/18 10/02/19 Yes [provider]  aspirin EC 81 MG  tablet Take 81 mg by mouth daily.    [provider]  cloNIDine (CATAPRES) 0.1 MG tablet Take 0.1-0.2 mg by mouth 2 (two) times daily. 0.1 mg in the morning and 0.2 mg at night 09/14/15   [provider]  diltiazem (CARDIZEM CD) 240 MG 24 hr capsule Take 240 mg by mouth 2 (two) times daily. 09/30/15   [provider]  donepezil (ARICEPT) 5 MG tablet Take 5 mg by mouth daily. 01/25/19   [provider]  finasteride (PROSCAR) 5 MG tablet Take 5 mg by mouth daily.    [provider]  hydrochlorothiazide (HYDRODIURIL) 25 MG tablet Take 25 mg by mouth daily.    [provider]  pravastatin (PRAVACHOL) 20 MG tablet Take 20 mg by mouth at bedtime.  09/30/15   [provider]  sertraline (ZOLOFT) 25 MG tablet Take 25 mg by mouth daily. 01/25/19   [provider]  tamsulosin (FLOMAX) 0.4 MG CAPS capsule Take 0.4 mg by mouth.    [provider]  Zoster Vaccine Live, PF, (ZOSTAVAX) 96295 UNT/0.65ML injection Inject 0.65 mLs into the skin once.    [provider]    Physical Exam: Vitals:   02/27/19 1405 02/27/19 1406 02/27/19 1451 02/27/19 1728  BP: (!) 163/93  (!) 176/100 (!) 164/86  Pulse: 90 91 90 97  Resp: 18  19 18   Temp:   (!) 97.4 F (36.3 C) (!) 97.3 F (36.3 C)  TempSrc:   Oral Oral  SpO2: 98% 100% 98% 100%  Weight:      Height:        Constitutional: NAD, calm, comfortable Vitals:   02/27/19 1405 02/27/19 1406 02/27/19 1451 02/27/19 1728  BP: (!) 163/93  (!) 176/100 (!) 164/86  Pulse: 90 91 90 97  Resp: 18  19 18   Temp:   (!) 97.4 F (36.3 C) (!) 97.3 F (36.3 C)  TempSrc:   Oral Oral  SpO2: 98% 100% 98% 100%  Weight:      Height:       Eyes: PERRL, lids and conjunctivae normal ENMT: Mucous membranes are moist. Posterior pharynx clear of any exudate or lesions.Normal dentition.  Neck: normal, supple, no masses, no thyromegaly Respiratory: clear to auscultation bilaterally, no wheezing, no crackles. Normal respiratory effort. No accessory muscle use.  Cardiovascular: Regular rate and rhythm, no murmurs occasional PVCs or sinus arrhythmia/ rubs / gallops. No extremity edema. 2+ pedal pulses. No carotid bruits.  Abdomen: no tenderness, no masses palpated. No hepatosplenomegaly. Bowel sounds positive.  Musculoskeletal: no clubbing / cyanosis. No joint deformity upper and lower extremities. Good ROM, no  contractures. Normal muscle tone.  Skin: no rashes, lesions, ulcers. No induration Neurologic: CN 2-12 grossly intact.  DTR normal.  Psychiatric: Confused uncooperative   Labs on Admission: I have personally reviewed following labs and imaging studies  CBC: Recent Labs  Lab 02/27/19 0953  WBC 15.5*  NEUTROABS 13.8*  HGB 14.1  HCT 41.2  MCV 85.1  PLT 123456   Basic Metabolic Panel: Recent Labs  Lab 02/27/19 0953 02/27/19 1324  NA 141  --   K 2.9*  --   CL 105  --   CO2 27  --   GLUCOSE 135*  --   BUN 19  --   CREATININE 0.72  --   CALCIUM 9.0  --   MG  --  1.6*  PHOS  --  3.2   GFR: Estimated Creatinine Clearance: 51.3 mL/min (by C-G  formula based on SCr of 0.72 mg/dL). Liver Function Tests: Recent Labs  Lab 02/27/19 0953  AST 53*  ALT 31  ALKPHOS 81  BILITOT 1.0  PROT 7.1  ALBUMIN 4.1   No results for input(s): LIPASE, AMYLASE in the last 168 hours. No results for input(s): AMMONIA in the last 168 hours. Coagulation Profile: No results for input(s): INR, PROTIME in the last 168 hours. Cardiac Enzymes: No results for input(s): CKTOTAL, CKMB, CKMBINDEX, TROPONINI in the last 168 hours. BNP (last 3 results) No results for input(s): PROBNP in the last 8760 hours. HbA1C: No results for input(s): HGBA1C in the last 72 hours. CBG: No results for input(s): GLUCAP in the last 168 hours. Lipid Profile: No results for input(s): CHOL, HDL, LDLCALC, TRIG, CHOLHDL, LDLDIRECT in the last 72 hours. Thyroid Function Tests: Recent Labs    02/27/19 1324  TSH 0.735  FREET4 0.85   Anemia Panel: Recent Labs    02/27/19 1324  VITAMINB12 886  FOLATE 46.0   Urine analysis:    Component Value Date/Time   COLORURINE STRAW (A) 02/27/2019 1701   APPEARANCEUR CLEAR (A) 02/27/2019 1701   APPEARANCEUR Cloudy 12/03/2013 1328   LABSPEC 1.035 (H) 02/27/2019 1701   LABSPEC 1.026 12/03/2013 1328   PHURINE 7.0 02/27/2019 1701   GLUCOSEU 50 (A) 02/27/2019 1701   GLUCOSEU  Negative 12/03/2013 1328   HGBUR SMALL (A) 02/27/2019 1701   BILIRUBINUR NEGATIVE 02/27/2019 1701   BILIRUBINUR 1+ 12/03/2013 1328   KETONESUR 5 (A) 02/27/2019 1701   PROTEINUR 100 (A) 02/27/2019 1701   NITRITE NEGATIVE 02/27/2019 1701   LEUKOCYTESUR NEGATIVE 02/27/2019 1701   LEUKOCYTESUR Negative 12/03/2013 1328    Radiological Exams on Admission: Dg Pelvis 1-2 Views  Result Date: 02/27/2019 CLINICAL DATA:  Found on the floor this morning. EXAM: PELVIS - 1-2 VIEW COMPARISON:  Abdomen pelvis CT dated 05/01/2006. FINDINGS: Diffuse osteopenia. No fracture or dislocation seen. Extensive lower lumbar spine degenerative changes and mild to moderate scoliosis. Atheromatous arterial calcifications. IMPRESSION: 1. No acute abnormality. 2. Extensive lower lumbar spine degenerative changes and mild to moderate scoliosis. Electronically Signed   By: Claudie Revering M.D.   On: 02/27/2019 10:59   Dg Shoulder Right  Result Date: 02/27/2019 CLINICAL DATA:  Back and neck pain, patient found on the floor this morning. EXAM: RIGHT SHOULDER - 2+ VIEW COMPARISON:  None. FINDINGS: Degenerative AC joint arthropathy. Reduced acromial humeral distance which can sometimes be seen in the setting of chronic rotator cuff tear. Thoracic spondylosis. Atherosclerotic calcification of the aortic arch. Prominent loss of articular space in the glenohumeral joint with associated spurring. IMPRESSION: 1. Reduced acromial humeral distance which can sometimes be seen in the setting of chronic rotator cuff tear. 2. Considerable degenerative glenohumeral arthropathy. 3. Thoracic spondylosis. 4. Atherosclerotic calcification of the aortic arch. Electronically Signed   By: Van Clines M.D.   On: 02/27/2019 13:35   Ct Head Wo Contrast  Result Date: 02/27/2019 CLINICAL DATA:  Head trauma. Found down on floor this morning. Neck pain. EXAM: CT HEAD WITHOUT CONTRAST TECHNIQUE: Contiguous axial images were obtained from the base of  the skull through the vertex without intravenous contrast. COMPARISON:  10/06/2015 head CT. FINDINGS: Brain: No evidence of parenchymal hemorrhage or extra-axial fluid collection. No mass lesion, mass effect, or midline shift. No CT evidence of acute infarction. Generalized cerebral volume loss. Nonspecific mild subcortical and periventricular white matter hypodensity, most in keeping with chronic small vessel ischemic change. No ventriculomegaly. Vascular: No acute abnormality.  Skull: No evidence of calvarial fracture. Mild focal scalp fat stranding in the anterior left frontal scalp. Sinuses/Orbits: Complete opacification of the right maxillary sinus. Small polyp versus mucous retention cyst in the anterior left maxillary sinus. No fluid levels. Other:  The mastoid air cells are unopacified. IMPRESSION: 1. Possible small anterior left frontal scalp contusion. No evidence calvarial fracture. 2.  No evidence of acute intracranial abnormality. 3. Generalized cerebral volume loss and mild chronic small vessel ischemic changes in cerebral white matter. 4. Bilateral maxillary sinusitis with complete right maxillary sinus opacification probably chronic. Electronically Signed   By: Ilona Sorrel M.D.   On: 02/27/2019 11:02   Ct Angio Chest Pe W And/or Wo Contrast  Result Date: 02/27/2019 CLINICAL DATA:  Found down, positive D-dimer EXAM: CT ANGIOGRAPHY CHEST WITH CONTRAST TECHNIQUE: Multidetector CT imaging of the chest was performed using the standard protocol during bolus administration of intravenous contrast. Multiplanar CT image reconstructions and MIPs were obtained to evaluate the vascular anatomy. CONTRAST:  42mL OMNIPAQUE IOHEXOL 350 MG/ML SOLN COMPARISON:  None. FINDINGS: Cardiovascular: Satisfactory opacification of the pulmonary arteries to the segmental level. No evidence of pulmonary embolism. Cardiomegaly. Three-vessel coronary artery calcifications. No pericardial effusion. Aortic atherosclerosis.  Mediastinum/Nodes: No enlarged mediastinal, hilar, or axillary lymph nodes. Thyroid gland, trachea, and esophagus demonstrate no significant findings. Lungs/Pleura: Lungs are clear. No pleural effusion or pneumothorax. Upper Abdomen: No acute abnormality. Musculoskeletal: No chest wall abnormality. No acute or significant osseous findings. Review of the MIP images confirms the above findings. IMPRESSION: 1.  Negative examination for pulmonary embolism. 2.  The cardiomegaly and coronary artery disease. 3.  Aortic Atherosclerosis (ICD10-I70.0). Electronically Signed   By: Eddie Candle M.D.   On: 02/27/2019 15:55   Ct Cervical Spine Wo Contrast  Result Date: 02/27/2019 CLINICAL DATA:  Found on floor this morning. Fall during the night. Back and neck pain. EXAM: CT CERVICAL SPINE WITHOUT CONTRAST TECHNIQUE: Multidetector CT imaging of the cervical spine was performed without intravenous contrast. Multiplanar CT image reconstructions were also generated. COMPARISON:  None. FINDINGS: Alignment: Normal. Skull base and vertebrae: No acute fracture. No primary bone lesion or focal pathologic process. Soft tissues and spinal canal: No prevertebral fluid or swelling. No visible canal hematoma. Disc levels: Multi level disc space narrowing and endplate spurring noted throughout the cervical spine. This is most advanced at C5-6 and C6-7. Upper chest: Negative. Other: Asymmetric opacification of the right maxillary sinus. IMPRESSION: 1. No evidence for cervical spine fracture. 2. Cervical degenerative disc disease. 3. Asymmetric opacification of the right maxillary sinus. Electronically Signed   By: Kerby Moors M.D.   On: 02/27/2019 10:58   Mr Brain Wo Contrast  Result Date: 02/27/2019 CLINICAL DATA:  Initial evaluation for acute trauma, fall. EXAM: MRI HEAD WITHOUT CONTRAST TECHNIQUE: Multiplanar, multiecho pulse sequences of the brain and surrounding structures were obtained without intravenous contrast.  COMPARISON:  Prior CT from earlier the same day as well as previous MRI from 04/07/2018. FINDINGS: Brain: Diffuse prominence of the CSF containing spaces compatible with generalized age-related cerebral atrophy. Patchy and confluent T2/FLAIR hyperintensity within the periventricular deep white matter both cerebral hemispheres most compatible with chronic microvascular ischemic disease, mild to moderate in nature. No abnormal foci of restricted diffusion to suggest acute ischemic infarct. Gray-white matter differentiation maintained. There is a small focus of cortical encephalomalacia seen involving the high left frontal lobe, consistent with a small chronic cortical infarct (series 10, image 24). Associated susceptibility artifact within this region consistent with  chronic blood products (series 11, image 23). Few scattered foci of T1 hyperintensity within this region also consistent with blood products (series 13, image 22), likely late subacute. Finding is new relative to previous MRI from 2019, although felt not to be acute in nature on today's exam. No acute hemorrhage seen within this region on prior CT from earlier today. No other acute intracranial hemorrhage seen elsewhere within the brain. No mass lesion, midline shift or mass effect. Diffuse ventricular prominence related to global parenchymal volume loss without hydrocephalus. No extra-axial fluid collection. Pituitary gland within normal limits. Vascular: Major intracranial vascular flow voids are maintained. Skull and upper cervical spine: Craniocervical junction within normal limits. Bone marrow signal intensity within normal limits. No appreciable scalp soft tissue injury. Sinuses/Orbits: Patient status post bilateral ocular lens replacement. Globes and orbital soft tissues demonstrate no acute finding. Chronic right maxillary sinusitis. Small retention cyst noted within the left maxillary sinus. Paranasal sinuses are otherwise clear. No mastoid  effusion. Other: None. IMPRESSION: 1. No acute intracranial abnormality. 2. 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 While this finding is new relative to previous MRI from December of 2019, this is not acute in appearance on today's exam. No acute intracranial hemorrhage seen within this region on prior CT from earlier the same day. 3. Underlying age-related cerebral atrophy with mild to moderate chronic microvascular ischemic disease. Electronically Signed   By: Jeannine Boga M.D.   On: 02/27/2019 16:57   Dg Chest Port 1 View  Result Date: 02/27/2019 CLINICAL DATA:  Found on the floor this morning. EXAM: PORTABLE CHEST 1 VIEW COMPARISON:  12/03/2013 FINDINGS: Stable mildly enlarged cardiac silhouette. Tortuous and calcified thoracic aorta. Clear lungs with mildly prominent interstitial markings. Normal vascularity. No fracture, pneumothorax or pleural fluid seen. Diffuse osteopenia. Lower thoracic spine degenerative changes. Mild scoliosis. Cholecystectomy clips. IMPRESSION: 1. No acute abnormality. Stable mild cardiomegaly. 2. Mild chronic interstitial lung disease. 3. Aortic atherosclerosis. Electronically Signed   By: Claudie Revering M.D.   On: 02/27/2019 10:58    EKG: Independently reviewed.  Sinus rhythm at 81, normal axis, LVH no ST changes or T wave inversions. Assessment/Plan Principal Problem:   Acute metabolic encephalopathy Active Problems:   Elevated troponin I measurement   Hypertension   Hypokalemia   At high risk for falls  (please populate well all problems here in Problem List. (For example, if patient is on BP meds at home and you resume or decide to hold them, it is a problem that needs to be her. Same for CAD, COPD, HLD and so on)  Encephalopathy -Differential includes alcohol withdrawal, meningitis, encephalitis, brain tumors, non-convulsive seizures, central venous thrombophlebitis, bacterial endocarditis,   basilar artery thrombosis, traumatic brain injury, and right hemisphere stroke, VTE -Structural brain disease ruled out by Negative head ct . -Discontinue all drugs with potential toxicity to the central nervous system includingThe home meds reviewed and tailored -Empiric thiamine for any history of alcoholism, malnutrition, cancer, hyperemesis gravidarum, or renal failure on hemodialysis. -Monitor electrolytes and replace deficiencies. - cardiac enzymes/ d-dimer/ aspiration and fall  And seizure precaution.   Elevated troponin: We will cycle cardiac enzymes and follow, if NSTEMI we will start pt on Lovenox and Plavix. Guaiac pending.  HTN: We will continue patient on home regimen of diltiazem and cut down the dose to 180 mg twice daily, Hydrochlorothiazide continued at a lower dose due to hypokalemia and dehydration at 12.5 mg daily for controlling  systolic blood pressure component.  Hypokalemia: We will replace with IV supplementation , magnesium and phosphorus levels pending.  Positive d-dimer: CTA chest/ covid -19    DVT prophylaxis: Heparin (Lovenox/Heparin/SCD's/anticoagulated/None (if comfort care) Code Status: Full code (Full/Partial (specify details) Family Communication: None at bedside (Specify name, relationship. Do not write "discussed with patient". Specify tel # if discussed over the phone) Disposition Plan: Skilled nursing facility, 72 hours.  (specify when and where you expect patient to be discharged) Consults called: None (with names) Admission status: Inpatient (inpatient / obs / tele / medical floor / SDU)   Para Skeans MD [facility] If 7PM-7AM, please contact night-coverage [email or web address] Password [email or web address]  02/27/2019, 8:06 PM  Covid-19 pending

## 2019-02-27 NOTE — ED Triage Notes (Signed)
Pt to ED by EMS after neighbor found pt in the floor this morning. Pt believed to have fallen some time during the night. Pt with c/o of back and neck pain.

## 2019-02-27 NOTE — Progress Notes (Signed)
Report was called for this patient, his potassium is 2.9, was not replace in the ED, notify Dr. Posey Pronto if we can replace this awaiting for orders.

## 2019-02-27 NOTE — ED Notes (Signed)
Report received from Kim,RN.

## 2019-02-27 NOTE — ED Notes (Signed)
POA Billy Bennett [phone]

## 2019-02-27 NOTE — Progress Notes (Addendum)
Patient is confuse, has a necklace with him, will lock in the security. Caleen Jobs RN was the second witness.

## 2019-02-27 NOTE — ED Provider Notes (Signed)
[facility] Emergency Department Provider Note   ____________________________________________    I have reviewed the triage vital signs and the nursing notes.   HISTORY  Chief Complaint Fall  History limited by dementia   HPI Billy Fuentez. is a 85 y.o. male who presents for a fall.  Apparently the patient lives alone and does have a history of dementia.  Was found down next to his bed today, neighbor came to check on him.  No further history is available.  Past Medical History:  Diagnosis Date  . Anxiety   . Arthritis   . Enlarged prostate   . GERD (gastroesophageal reflux disease)   . Heart murmur, systolic 123456  . HOH (hard of hearing)   . Hypercholesteremia   . Hypertension   . Other constipation 07/01/2017  . Pure hypercholesterolemia 04/01/2011  . Skin cancer 08/22/2016   Overview:  Patient sees Dr. Phillip Heal    Patient Active Problem List   Diagnosis Date Noted  . At high risk for falls 08/28/2017  . Fecal smearing 07/01/2017  . Other constipation 07/01/2017  . Heart murmur, systolic 123456  . Skin cancer 08/22/2016  . Status post arthroscopy 07/16/2016  . Pure hypercholesterolemia 04/01/2011  . Hypertension 01/20/2011    Past Surgical History:  Procedure Laterality Date  . ANAL FISSURE REPAIR    . CHOLECYSTECTOMY    . EYE SURGERY Bilateral    Cataract Extraction with IOL  . KNEE ARTHROSCOPY Right 07/08/2016   Procedure: ARTHROSCOPY KNEE, PARTIAL MEDIAL MENISECTOMY, CHONDROPLASTY;  Surgeon: Dereck Leep, MD;  Location: [facility] ORS;  Service: Orthopedics;  Laterality: Right;    Prior to Admission medications   Medication Sig Start Date End Date Taking? Authorizing Provider  aspirin EC 81 MG tablet Take 81 mg by mouth daily.   Yes [provider]  cloNIDine (CATAPRES) 0.1 MG tablet Take 0.1-0.2 mg by mouth 2 (two) times daily. 0.1 mg in the morning and 0.2 mg at night 09/14/15  Yes [provider]   diltiazem (CARDIZEM CD) 240 MG 24 hr capsule Take 240 mg by mouth 2 (two) times daily. 09/30/15  Yes [provider]  donepezil (ARICEPT) 5 MG tablet Take 5 mg by mouth daily. 01/25/19  Yes [provider]  finasteride (PROSCAR) 5 MG tablet Take 5 mg by mouth daily.   Yes [provider]  hydrochlorothiazide (HYDRODIURIL) 25 MG tablet Take 25 mg by mouth daily.   Yes [provider]  naproxen (NAPROSYN) 500 MG tablet Take 500 mg by mouth daily. 12/15/18  Yes [provider]  omeprazole (PRILOSEC) 20 MG capsule Take 20 mg by mouth 2 (two) times daily. 10/02/18 10/02/19 Yes [provider]  pravastatin (PRAVACHOL) 20 MG tablet Take 20 mg by mouth at bedtime.  09/30/15  Yes [provider]  sertraline (ZOLOFT) 25 MG tablet Take 25 mg by mouth daily. 01/25/19  Yes [provider]  tamsulosin (FLOMAX) 0.4 MG CAPS capsule Take 0.4 mg by mouth.   Yes [provider]  Zoster Vaccine Live, PF, (ZOSTAVAX) 24401 UNT/0.65ML injection Inject 0.65 mLs into the skin once.    [provider]     Allergies Ace inhibitors, Telmisartan-hctz, Amoxicillin-pot clavulanate, and Ibuprofen  Family History  Problem Relation Age of Onset  . Alzheimer's disease Father   . Prostate cancer Brother     Social History Social History   Tobacco Use  . Smoking status: Former Smoker    Types: Cigarettes  Quit date: 04/14/1977    Years since quitting: 41.9  . Smokeless tobacco: Never Used  Substance Use Topics  . Alcohol use: No  . Drug use: No    Unable to obtain review of Systems     ____________________________________________   PHYSICAL EXAM:  VITAL SIGNS: ED Triage Vitals  Enc Vitals Group     BP 02/27/19 0941 (!) 196/101     Pulse Rate 02/27/19 0941 76     Resp 02/27/19 0941 20     Temp 02/27/19 0941 (!) 97.4 F (36.3 C)     Temp Source 02/27/19 0941 Oral     SpO2 02/27/19 0941 96 %     Weight 02/27/19 0947  72.6 kg (160 lb)     Height 02/27/19 0947 1.651 m (5\' 5" )     Head Circumference --      Peak Flow --      Pain Score --      Pain Loc --      Pain Edu? --      Excl. in [place]? --     Constitutional: Alert but disoriented  Head: Atraumatic. Nose: No swelling or epistaxis Mouth/Throat: Mucous membranes are dry Neck: No vertebral tenderness palpation of the Cardiovascular: Normal rate, regular rhythm. Good peripheral circulation.  No chest wall tenderness Respiratory: Normal respiratory effort.  No retractions. Lungs CTAB. Gastrointestinal: Soft and nontender. No distention.  Genitourinary: deferred Musculoskeletal: No pain with range of motion of all extremities warm and well perfused, no pelvic tenderness palpation, no vertebral tenderness palpation Neurologic:  Normal speech and language. No gross focal neurologic deficits are appreciated.  Skin:  Skin is warm, dry.  Skin tear to the right lower leg anteriorly .  ____________________________________________   LABS (all labs ordered are listed, but only abnormal results are displayed)  Labs Reviewed  CBC WITH DIFFERENTIAL/PLATELET - Abnormal; Notable for the following components:      Result Value   WBC 15.5 (*)    Neutro Abs 13.8 (*)    Abs Immature Granulocytes 0.08 (*)    All other components within normal limits  COMPREHENSIVE METABOLIC PANEL - Abnormal; Notable for the following components:   Potassium 2.9 (*)    Glucose, Bld 135 (*)    AST 53 (*)    All other components within normal limits  TROPONIN I (HIGH SENSITIVITY) - Abnormal; Notable for the following components:   Troponin I (High Sensitivity) 49 (*)    All other components within normal limits  SARS CORONAVIRUS 2 (TAT 6-24 HRS)  URINALYSIS, COMPLETE (UACMP) WITH MICROSCOPIC   ____________________________________________  EKG  ED ECG REPORT I, Lavonia Drafts, the attending physician, personally viewed and interpreted this ECG.  Date: 02/27/2019   Rhythm: normal sinus rhythm QRS Axis: normal Intervals: normal ST/T Wave abnormalities: Nonspecific changes Narrative Interpretation: no evidence of acute ischemia  ____________________________________________  RADIOLOGY  CT head cervical spine unremarkable X-rays of the pelvis and chest unremarkable ____________________________________________   PROCEDURES  Procedure(s) performed: No  Procedures   Critical Care performed: No ____________________________________________   INITIAL IMPRESSION / ASSESSMENT AND PLAN / ED COURSE  Pertinent labs & imaging results that were available during my care of the patient were reviewed by me and considered in my medical decision making (see chart for details).  Patient presents after being found down, he appears confused although this may be his baseline is unclear.  No evidence of infection at this time.  Troponin is elevated however, EKG with nonspecific changes, will  discuss with hospitalist for admission    ____________________________________________   FINAL CLINICAL IMPRESSION(S) / ED DIAGNOSES  Final diagnoses:  Fall, initial encounter  Injury of head, initial encounter  Elevated troponin        Note:  This document was prepared using Dragon voice recognition software and may include unintentional dictation errors.   Lavonia Drafts, MD 02/27/19 1253

## 2019-02-27 NOTE — ED Notes (Signed)
Pt brief was wet and pt had a BM. Pt cleaned up by this RN and Scientist, clinical (histocompatibility and immunogenetics) and new brief and chucks provided. Urine sample sent to lab. Skin assessed and pt found to a "blister appearing" area to the left hip that measures about 1 in in diameter. No bleeding noted. Pt also has small skin tear to the lower right leg, bleeding controlled.

## 2019-02-28 ENCOUNTER — Inpatient Hospital Stay (HOSPITAL_COMMUNITY)
Admit: 2019-02-28 | Discharge: 2019-02-28 | Disposition: A | Discharge status: Home / Self Care | Payer: Medicare Other | Attending: Nurse Practitioner | Admitting: Nurse Practitioner

## 2019-02-28 DIAGNOSIS — I340 Nonrheumatic mitral (valve) insufficiency: Secondary | ICD-10-CM

## 2019-02-28 DIAGNOSIS — G9341 Metabolic encephalopathy: Secondary | ICD-10-CM

## 2019-02-28 DIAGNOSIS — K219 Gastro-esophageal reflux disease without esophagitis: Secondary | ICD-10-CM

## 2019-02-28 DIAGNOSIS — S0990XA Unspecified injury of head, initial encounter: Secondary | ICD-10-CM

## 2019-02-28 DIAGNOSIS — I350 Nonrheumatic aortic (valve) stenosis: Secondary | ICD-10-CM

## 2019-02-28 DIAGNOSIS — I619 Nontraumatic intracerebral hemorrhage, unspecified: Secondary | ICD-10-CM

## 2019-02-28 DIAGNOSIS — T796XXA Traumatic ischemia of muscle, initial encounter: Secondary | ICD-10-CM

## 2019-02-28 LAB — BASIC METABOLIC PANEL
Anion gap: 9 (ref 5–15)
BUN: 18 mg/dL (ref 8–23)
CO2: 23 mmol/L (ref 22–32)
Calcium: 8.5 mg/dL — ABNORMAL LOW (ref 8.9–10.3)
Chloride: 109 mmol/L (ref 98–111)
Creatinine, Ser: 0.81 mg/dL (ref 0.61–1.24)
GFR calc Af Amer: >60 mL/min (ref >60)
GFR calc non Af Amer: >60 mL/min (ref >60)
Glucose, Bld: 85 mg/dL (ref 70–99)
Potassium: 2.9 mmol/L — ABNORMAL LOW (ref 3.5–5.1)
Sodium: 141 mmol/L (ref 135–145)

## 2019-02-28 LAB — TROPONIN I (HIGH SENSITIVITY)
Troponin I (High Sensitivity): 89 ng/L — ABNORMAL HIGH (ref <18)
Troponin I (High Sensitivity): 91 ng/L — ABNORMAL HIGH (ref <18)

## 2019-02-28 LAB — POTASSIUM: Potassium: 2.9 mmol/L — ABNORMAL LOW (ref 3.5–5.1)

## 2019-02-28 LAB — MAGNESIUM: Magnesium: 2 mg/dL (ref 1.7–2.4)

## 2019-02-28 LAB — RPR: RPR Ser Ql: NONREACTIVE

## 2019-02-28 MED ORDER — POTASSIUM CHLORIDE IN NACL 20-0.9 MEQ/L-% IV SOLN
INTRAVENOUS | Status: DC
  Administered 2019-02-28 – 2019-03-02 (×3): via INTRAVENOUS
  Filled 2019-02-28 (×4): qty 1000

## 2019-02-28 MED ORDER — CLONIDINE HCL 0.1 MG/24HR TD PTWK: 0.1000 mg | MEDICATED_PATCH | TRANSDERMAL | Status: DC

## 2019-02-28 MED ORDER — LABETALOL HCL 5 MG/ML IV SOLN
10.0000 mg | INTRAVENOUS | Status: DC | PRN
  Administered 2019-02-28: 10 mg via INTRAVENOUS
  Filled 2019-02-28: qty 4

## 2019-02-28 MED ORDER — LABETALOL HCL 5 MG/ML IV SOLN: 10.0000 mg | INTRAVENOUS | Status: DC | PRN

## 2019-02-28 MED ORDER — METOPROLOL TARTRATE 5 MG/5ML IV SOLN: 5.0000 mg | Freq: Four times a day (QID) | INTRAVENOUS | Status: DC

## 2019-02-28 NOTE — Consult Note (Signed)
Reason for Consult: confusion/AMS Referring Physician: Dr. Leslye Peer  CC: confusion/AMS  HPI: Billy Bennett. is an 85 y.o. male y.o.malewith medical history significant ofhypertension, dementia, GERD, found down at home altered. Information obtained from pt's assistant who helps take care of him daily.  At baseline patient lives at home along able to dress and feed himself.  Presented with AMS/confusion which has improvd this AM as per caretaker. No clear signs of infection.   Past Medical History:  Diagnosis Date  . Anxiety   . Arthritis   . Enlarged prostate   . GERD (gastroesophageal reflux disease)   . Heart murmur, systolic [id number]  . HOH (hard of hearing)   . Hypercholesteremia   . Hypertension   . Other constipation 07/01/2017  . Pure hypercholesterolemia 04/01/2011  . Skin cancer 08/22/2016   Overview:  Patient sees Dr. Phillip Heal    Past Surgical History:  Procedure Laterality Date  . ANAL FISSURE REPAIR    . CHOLECYSTECTOMY    . EYE SURGERY Bilateral    Cataract Extraction with IOL  . KNEE ARTHROSCOPY Right 07/08/2016   Procedure: ARTHROSCOPY KNEE, PARTIAL MEDIAL MENISECTOMY, CHONDROPLASTY;  Surgeon: Dereck Leep, MD;  Location: [facility] ORS;  Service: Orthopedics;  Laterality: Right;    Family History  Problem Relation Age of Onset  . Alzheimer's disease Father   . Prostate cancer Brother     Social History:  reports that he quit smoking about 41 years ago. His smoking use included cigarettes. He has never used smokeless tobacco. He reports that he does not drink alcohol or use drugs.  Allergies  Allergen Reactions  . Ace Inhibitors Cough  . Telmisartan-Hctz Cough    Other Reaction: ARBS - COUGH  . Amoxicillin-Pot Clavulanate Diarrhea    Has patient had a PCN reaction causing immediate rash, facial/tongue/throat swelling, SOB or lightheadedness with hypotension:unsure Has patient had a PCN reaction causing severe rash involving mucus membranes or skin  necrosis:unsure Has patient had a PCN reaction that required hospitalization:unsure  Has patient had a PCN reaction occurring within the last 10 years:Yes If all of the above answers are "NO", then may proceed with Cephalosporin use.   . Ibuprofen Other (See Comments)    Ankle swelling ( can take naprosyn)    Medications: I have reviewed the patient's current medications.  ROS: Unable to obtain due to confusion   Physical Examination: Blood pressure (!) 157/80, pulse 70, temperature 99 F (37.2 C), temperature source Oral, resp. rate 17, height 5\' 5"  (1.651 m), weight 70.8 kg, SpO2 100 %.  Neurological Examination   Mental Status: Alert to name only  Cranial Nerves: II: Discs flat bilaterally; Visual fields grossly normal, pupils equal, round, reactive to light and accommodation III,IV, VI: ptosis not present, extra-ocular motions intact bilaterally V,VII: smile symmetric, facial light touch sensation normal bilaterally VIII: hearing normal bilaterally IX,X: gag reflex present XI: bilateral shoulder shrug XII: midline tongue extension Motor: Generalized weakness bilaterally  Tone and bulk:normal tone throughout; no atrophy noted Sensory: Pinprick and light touch intact throughout, bilaterally Deep Tendon Reflexes: 1+ and symmetric throughout Plantars: Right: downgoing   Left: downgoing Cerebellar: normal finger-to-nose      Laboratory Studies:   Basic Metabolic Panel: Recent Labs  Lab 02/27/19 0953 02/27/19 1324 02/28/19 0302  NA 141  --  141  K 2.9*  --  2.9*  CL 105  --  109  CO2 27  --  23  GLUCOSE 135*  --  85  BUN  19  --  18  CREATININE 0.72  --  0.81  CALCIUM 9.0  --  8.5*  MG  --  1.6* 2.0  PHOS  --  3.2  --     Liver Function Tests: Recent Labs  Lab 02/27/19 0953  AST 53*  ALT 31  ALKPHOS 81  BILITOT 1.0  PROT 7.1  ALBUMIN 4.1   No results for input(s): LIPASE, AMYLASE in the last 168 hours. No results for input(s): AMMONIA in the last  168 hours.  CBC: Recent Labs  Lab 02/27/19 0953  WBC 15.5*  NEUTROABS 13.8*  HGB 14.1  HCT 41.2  MCV 85.1  PLT 211    Cardiac Enzymes: Recent Labs  Lab 02/27/19 1736  CKTOTAL 897*    BNP: Invalid input(s): POCBNP  CBG: No results for input(s): GLUCAP in the last 168 hours.  Microbiology: Results for orders placed or performed during the hospital encounter of 02/27/19  SARS CORONAVIRUS 2 (TAT 6-24 HRS) Nasopharyngeal Nasopharyngeal Swab     Status: None   Collection Time: 02/27/19 12:51 PM   Specimen: Nasopharyngeal Swab  Result Value Ref Range Status   SARS Coronavirus 2 NEGATIVE NEGATIVE Final    Comment: (NOTE) SARS-CoV-2 target nucleic acids are NOT DETECTED. The SARS-CoV-2 RNA is generally detectable in upper and lower respiratory specimens during the acute phase of infection. Negative results do not preclude SARS-CoV-2 infection, do not rule out co-infections with other pathogens, and should not be used as the sole basis for treatment or other patient management decisions. Negative results must be combined with clinical observations, patient history, and epidemiological information. The expected result is Negative. Fact Sheet for Patients: SugarRoll.be Fact Sheet for Healthcare Providers: https://www.woods-mathews.com/ This test is not yet approved or cleared by the [place] FDA and  has been authorized for detection and/or diagnosis of SARS-CoV-2 by FDA under an Emergency Use Authorization (EUA). This EUA will remain  in effect (meaning this test can be used) for the duration of the COVID-19 declaration under Section 56 4(b)(1) of the Act, 21 U.S.C. section 360bbb-3(b)(1), unless the authorization is terminated or revoked sooner. Performed at [facility], [place] [address], [place], [place] [address]     Coagulation Studies: No results for input(s): LABPROT, INR in the last 72 hours.  Urinalysis:   Recent Labs  Lab 02/27/19 1701  COLORURINE STRAW*  LABSPEC 1.035*  PHURINE 7.0  GLUCOSEU 50*  HGBUR SMALL*  BILIRUBINUR NEGATIVE  KETONESUR 5*  PROTEINUR 100*  NITRITE NEGATIVE  LEUKOCYTESUR NEGATIVE    Lipid Panel:     Component Value Date/Time   CHOL 104 12/03/2013 0848   TRIG 92 12/03/2013 0848   HDL 50 12/03/2013 0848   VLDL 18 12/03/2013 0848   LDLCALC 36 12/03/2013 0848    HgbA1C: No results found for: HGBA1C  Urine Drug Screen:  No results found for: LABOPIA, COCAINSCRNUR, LABBENZ, AMPHETMU, THCU, LABBARB  Alcohol Level: No results for input(s): ETH in the last 168 hours.  Other results: EKG: normal EKG, normal sinus rhythm, unchanged from previous tracings.  Imaging: Dg Pelvis 1-2 Views  Result Date: 02/27/2019 CLINICAL DATA:  Found on the floor this morning. EXAM: PELVIS - 1-2 VIEW COMPARISON:  Abdomen pelvis CT dated 05/01/2006. FINDINGS: Diffuse osteopenia. No fracture or dislocation seen. Extensive lower lumbar spine degenerative changes and mild to moderate scoliosis. Atheromatous arterial calcifications. IMPRESSION: 1. No acute abnormality. 2. Extensive lower lumbar spine degenerative changes and mild to moderate scoliosis. Electronically Signed  By: Claudie Revering M.D.   On: 02/27/2019 10:59   Dg Shoulder Right  Result Date: 02/27/2019 CLINICAL DATA:  Back and neck pain, patient found on the floor this morning. EXAM: RIGHT SHOULDER - 2+ VIEW COMPARISON:  None. FINDINGS: Degenerative AC joint arthropathy. Reduced acromial humeral distance which can sometimes be seen in the setting of chronic rotator cuff tear. Thoracic spondylosis. Atherosclerotic calcification of the aortic arch. Prominent loss of articular space in the glenohumeral joint with associated spurring. IMPRESSION: 1. Reduced acromial humeral distance which can sometimes be seen in the setting of chronic rotator cuff tear. 2. Considerable degenerative glenohumeral arthropathy. 3. Thoracic  spondylosis. 4. Atherosclerotic calcification of the aortic arch. Electronically Signed   By: Van Clines M.D.   On: 02/27/2019 13:35   Ct Head Wo Contrast  Result Date: 02/27/2019 CLINICAL DATA:  Head trauma. Found down on floor this morning. Neck pain. EXAM: CT HEAD WITHOUT CONTRAST TECHNIQUE: Contiguous axial images were obtained from the base of the skull through the vertex without intravenous contrast. COMPARISON:  10/06/2015 head CT. FINDINGS: Brain: No evidence of parenchymal hemorrhage or extra-axial fluid collection. No mass lesion, mass effect, or midline shift. No CT evidence of acute infarction. Generalized cerebral volume loss. Nonspecific mild subcortical and periventricular white matter hypodensity, most in keeping with chronic small vessel ischemic change. No ventriculomegaly. Vascular: No acute abnormality. Skull: No evidence of calvarial fracture. Mild focal scalp fat stranding in the anterior left frontal scalp. Sinuses/Orbits: Complete opacification of the right maxillary sinus. Small polyp versus mucous retention cyst in the anterior left maxillary sinus. No fluid levels. Other:  The mastoid air cells are unopacified. IMPRESSION: 1. Possible small anterior left frontal scalp contusion. No evidence calvarial fracture. 2.  No evidence of acute intracranial abnormality. 3. Generalized cerebral volume loss and mild chronic small vessel ischemic changes in cerebral white matter. 4. Bilateral maxillary sinusitis with complete right maxillary sinus opacification probably chronic. Electronically Signed   By: Ilona Sorrel M.D.   On: 02/27/2019 11:02   Ct Angio Chest Pe W And/or Wo Contrast  Result Date: 02/27/2019 CLINICAL DATA:  Found down, positive D-dimer EXAM: CT ANGIOGRAPHY CHEST WITH CONTRAST TECHNIQUE: Multidetector CT imaging of the chest was performed using the standard protocol during bolus administration of intravenous contrast. Multiplanar CT image reconstructions and MIPs  were obtained to evaluate the vascular anatomy. CONTRAST:  55mL OMNIPAQUE IOHEXOL 350 MG/ML SOLN COMPARISON:  None. FINDINGS: Cardiovascular: Satisfactory opacification of the pulmonary arteries to the segmental level. No evidence of pulmonary embolism. Cardiomegaly. Three-vessel coronary artery calcifications. No pericardial effusion. Aortic atherosclerosis. Mediastinum/Nodes: No enlarged mediastinal, hilar, or axillary lymph nodes. Thyroid gland, trachea, and esophagus demonstrate no significant findings. Lungs/Pleura: Lungs are clear. No pleural effusion or pneumothorax. Upper Abdomen: No acute abnormality. Musculoskeletal: No chest wall abnormality. No acute or significant osseous findings. Review of the MIP images confirms the above findings. IMPRESSION: 1.  Negative examination for pulmonary embolism. 2.  The cardiomegaly and coronary artery disease. 3.  Aortic Atherosclerosis (ICD10-I70.0). Electronically Signed   By: Eddie Candle M.D.   On: 02/27/2019 15:55   Ct Cervical Spine Wo Contrast  Result Date: 02/27/2019 CLINICAL DATA:  Found on floor this morning. Fall during the night. Back and neck pain. EXAM: CT CERVICAL SPINE WITHOUT CONTRAST TECHNIQUE: Multidetector CT imaging of the cervical spine was performed without intravenous contrast. Multiplanar CT image reconstructions were also generated. COMPARISON:  None. FINDINGS: Alignment: Normal. Skull base and vertebrae: No acute fracture. No primary  bone lesion or focal pathologic process. Soft tissues and spinal canal: No prevertebral fluid or swelling. No visible canal hematoma. Disc levels: Multi level disc space narrowing and endplate spurring noted throughout the cervical spine. This is most advanced at C5-6 and C6-7. Upper chest: Negative. Other: Asymmetric opacification of the right maxillary sinus. IMPRESSION: 1. No evidence for cervical spine fracture. 2. Cervical degenerative disc disease. 3. Asymmetric opacification of the right maxillary  sinus. Electronically Signed   By: Kerby Moors M.D.   On: 02/27/2019 10:58   Mr Brain Wo Contrast  Result Date: 02/27/2019 CLINICAL DATA:  Initial evaluation for acute trauma, fall. EXAM: MRI HEAD WITHOUT CONTRAST TECHNIQUE: Multiplanar, multiecho pulse sequences of the brain and surrounding structures were obtained without intravenous contrast. COMPARISON:  Prior CT from earlier the same day as well as previous MRI from 04/07/2018. FINDINGS: Brain: Diffuse prominence of the CSF containing spaces compatible with generalized age-related cerebral atrophy. Patchy and confluent T2/FLAIR hyperintensity within the periventricular deep white matter both cerebral hemispheres most compatible with chronic microvascular ischemic disease, mild to moderate in nature. No abnormal foci of restricted diffusion to suggest acute ischemic infarct. Gray-white matter differentiation maintained. There is a small focus of cortical encephalomalacia seen involving the high left frontal lobe, consistent with a small chronic cortical infarct (series 10, image 24). Associated susceptibility artifact within this region consistent with chronic blood products (series 11, image 23). Few scattered foci of T1 hyperintensity within this region also consistent with blood products (series 13, image 22), likely late subacute. Finding is new relative to previous MRI from 2019, although felt not to be acute in nature on today's exam. No acute hemorrhage seen within this region on prior CT from earlier today. No other acute intracranial hemorrhage seen elsewhere within the brain. No mass lesion, midline shift or mass effect. Diffuse ventricular prominence related to global parenchymal volume loss without hydrocephalus. No extra-axial fluid collection. Pituitary gland within normal limits. Vascular: Major intracranial vascular flow voids are maintained. Skull and upper cervical spine: Craniocervical junction within normal limits. Bone marrow signal  intensity within normal limits. No appreciable scalp soft tissue injury. Sinuses/Orbits: Patient status post bilateral ocular lens replacement. Globes and orbital soft tissues demonstrate no acute finding. Chronic right maxillary sinusitis. Small retention cyst noted within the left maxillary sinus. Paranasal sinuses are otherwise clear. No mastoid effusion. Other: None. IMPRESSION: 1. No acute intracranial abnormality. 2. 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 While this finding is new relative to previous MRI from December of 2019, this is not acute in appearance on today's exam. No acute intracranial hemorrhage seen within this region on prior CT from earlier the same day. 3. Underlying age-related cerebral atrophy with mild to moderate chronic microvascular ischemic disease. Electronically Signed   By: Jeannine Boga M.D.   On: 02/27/2019 16:57   US Carotid Bilateral  Result Date: 02/28/2019 CLINICAL DATA:  Encephalopathy. History of hypertension, hyperlipidemia, syncopal episode and stroke. EXAM: BILATERAL CAROTID DUPLEX ULTRASOUND TECHNIQUE: Pearline Cables scale imaging, color Doppler and duplex ultrasound were performed of bilateral carotid and vertebral arteries in the neck. COMPARISON:  None. FINDINGS: Criteria: Quantification of carotid stenosis is based on velocity parameters that correlate the residual internal carotid diameter with NASCET-based stenosis levels, using the diameter of the distal internal carotid lumen as the denominator for stenosis measurement. The following velocity measurements were obtained: RIGHT ICA: 58/12 cm/sec CCA: [id number] cm/sec SYSTOLIC ICA/CCA RATIO:  1.2 ECA: 87  cm/sec LEFT ICA: 77/14 cm/sec CCA: [id number] cm/sec SYSTOLIC ICA/CCA RATIO:  1.7 ECA: 123 cm/sec RIGHT CAROTID ARTERY: There is a moderate amount of eccentric echogenic plaque within the right carotid bulb (images 14 and 15), extending to involve the origin and  proximal aspects of the right internal carotid artery (image 22, not resulting in elevated peak systolic velocities within the interrogated course the right internal carotid artery to suggest a hemodynamically significant stenosis. RIGHT VERTEBRAL ARTERY:  Antegrade flow LEFT CAROTID ARTERY: There is a minimal amount of eccentric mixed echogenic plaque with the mid aspect the left common carotid artery (image 40). There is a moderate amount of eccentric mixed echogenic plaque within the left carotid bulb (image 48), extending to involve the origin and proximal aspects of the left internal carotid artery (image 55, not resulting in elevated peak systolic velocities within the interrogated course the left internal carotid artery to suggest a hemodynamically significant stenosis. LEFT VERTEBRAL ARTERY:  Antegrade flow IMPRESSION: Moderate amount of bilateral atherosclerotic plaque, left subjectively greater than right, not resulting in a hemodynamically significant stenosis within either internal carotid artery. Electronically Signed   By: Sandi Mariscal M.D.   On: 02/28/2019 08:06   Dg Chest Port 1 View  Result Date: 02/27/2019 CLINICAL DATA:  Found on the floor this morning. EXAM: PORTABLE CHEST 1 VIEW COMPARISON:  12/03/2013 FINDINGS: Stable mildly enlarged cardiac silhouette. Tortuous and calcified thoracic aorta. Clear lungs with mildly prominent interstitial markings. Normal vascularity. No fracture, pneumothorax or pleural fluid seen. Diffuse osteopenia. Lower thoracic spine degenerative changes. Mild scoliosis. Cholecystectomy clips. IMPRESSION: 1. No acute abnormality. Stable mild cardiomegaly. 2. Mild chronic interstitial lung disease. 3. Aortic atherosclerosis. Electronically Signed   By: Claudie Revering M.D.   On: 02/27/2019 10:58     Assessment/Plan:  85 y.o. male y.o.malewith medical history significant ofhypertension, dementia, GERD, found down at home altered. Information obtained from pt's  assistant who helps take care of him daily.  At baseline patient lives at home along able to dress and feed himself.  Presented with AMS/confusion which has improvd this AM as per caretaker. No clear signs of infection.   - MRI reviewed with chronic changes and chronic intracranial hemorrhage without any acute abnormality - as per caretaker mentation improved since yesterday - ? Vascular dementia  - observation/hydration.  - Since improving would hold off EEG or any other studies from neurological standpoint.  02/28/2019, 11:49 AM

## 2019-02-28 NOTE — Plan of Care (Signed)
  Problem: Clinical Measurements: Goal: Respiratory complications will improve Outcome: Progressing   Problem: Clinical Measurements: Goal: Cardiovascular complication will be avoided Outcome: Progressing   Problem: Clinical Measurements: Goal: Ability to maintain clinical measurements within normal limits will improve Outcome: Progressing   

## 2019-02-28 NOTE — Progress Notes (Signed)
Patient ID: Billy Bennett., male   DOB: October 13, 1923, 85 y.o.   MRN: [id number] [facility] PROGRESS NOTE  [name] [id number] DOB: 12-07-1923 DOA: 02/27/2019 PCP: Gayland Curry, MD  HPI/Subjective: Patient able to answer some yes or no questions.  I asked him on 2 separate occasions with his nurse at present on what to do in the emergency situation and he said no to the question of if he wanted CPR.  This was confirmed with his POA over the phone.  Patient able to squeeze my hands bilaterally.  Did not do well with the swallow evaluation on presentation.  Objective: Vitals:   02/27/19 2018 02/28/19 0411  BP: (!) 176/94 (!) 125/57  Pulse: 79 69  Resp: 19 17  Temp: 98.2 F (36.8 C) 98 F (36.7 C)  SpO2: 100% 100%    Intake/Output Summary (Last 24 hours) at 02/28/2019 0753 Last data filed at 02/27/2019 1327 Gross per 24 hour  Intake 1100 ml  Output -  Net 1100 ml   Filed Weights   02/27/19 0947 02/28/19 0411  Weight: 72.6 kg 70.8 kg    ROS: Review of Systems  Unable to perform ROS: Mental acuity  Respiratory: Negative for cough and shortness of breath.   Cardiovascular: Negative for chest pain.  Gastrointestinal: Negative for abdominal pain, constipation, diarrhea, nausea and vomiting.  Musculoskeletal: Negative for joint pain.   Exam: Physical Exam  HENT:  Nose: No mucosal edema.  Mouth/Throat: No oropharyngeal exudate.  Eyes: Pupils are equal, round, and reactive to light. Conjunctivae and lids are normal.  Neck: Carotid bruit is not present. No thyromegaly present.  Cardiovascular: Regular rhythm, S1 normal and S2 normal.  Murmur heard.  Systolic murmur is present with a grade of 2/6. Respiratory: He has no decreased breath sounds. He has no wheezes. He has no rhonchi. He has no rales.  GI: Soft. Bowel sounds are normal. There is no abdominal tenderness.  Musculoskeletal:     Right ankle: He exhibits no swelling.     Left ankle: He  exhibits no swelling.  Neurological: He is alert.  Able to hold his arms up.  Able to squeeze my hands.  Difficulty with straight leg raise bilaterally.  Skin: Skin is warm. No rash noted. Nails show no clubbing.  Skin tear right leg  Psychiatric:  Able to answer some yes or no questions.  Did not elaborate very much.      Data Reviewed: Basic Metabolic Panel: Recent Labs  Lab 02/27/19 0953 02/27/19 1324 02/28/19 0302  NA 141  --  141  K 2.9*  --  2.9*  CL 105  --  109  CO2 27  --  23  GLUCOSE 135*  --  85  BUN 19  --  18  CREATININE 0.72  --  0.81  CALCIUM 9.0  --  8.5*  MG  --  1.6* 2.0  PHOS  --  3.2  --    Liver Function Tests: Recent Labs  Lab 02/27/19 0953  AST 53*  ALT 31  ALKPHOS 81  BILITOT 1.0  PROT 7.1  ALBUMIN 4.1   CBC: Recent Labs  Lab 02/27/19 0953  WBC 15.5*  NEUTROABS 13.8*  HGB 14.1  HCT 41.2  MCV 85.1  PLT 211   Cardiac Enzymes: Recent Labs  Lab 02/27/19 1736  CKTOTAL 897*     Recent Results (from the past 240 hour(s))  SARS CORONAVIRUS 2 (TAT 6-24 HRS) Nasopharyngeal Nasopharyngeal Swab  Status: None   Collection Time: 02/27/19 12:51 PM   Specimen: Nasopharyngeal Swab  Result Value Ref Range Status   SARS Coronavirus 2 NEGATIVE NEGATIVE Final    Comment: (NOTE) SARS-CoV-2 target nucleic acids are NOT DETECTED. The SARS-CoV-2 RNA is generally detectable in upper and lower respiratory specimens during the acute phase of infection. Negative results do not preclude SARS-CoV-2 infection, do not rule out co-infections with other pathogens, and should not be used as the sole basis for treatment or other patient management decisions. Negative results must be combined with clinical observations, patient history, and epidemiological information. The expected result is Negative. Fact Sheet for Patients: SugarRoll.be Fact Sheet for Healthcare Providers: https://www.woods-mathews.com/ This  test is not yet approved or cleared by the [place] FDA and  has been authorized for detection and/or diagnosis of SARS-CoV-2 by FDA under an Emergency Use Authorization (EUA). This EUA will remain  in effect (meaning this test can be used) for the duration of the COVID-19 declaration under Section 56 4(b)(1) of the Act, 21 U.S.C. section 360bbb-3(b)(1), unless the authorization is terminated or revoked sooner. Performed at [facility], [place] [address], [place], [place] [address]      Studies: Dg Pelvis 1-2 Views  Result Date: 02/27/2019 CLINICAL DATA:  Found on the floor this morning. EXAM: PELVIS - 1-2 VIEW COMPARISON:  Abdomen pelvis CT dated 05/01/2006. FINDINGS: Diffuse osteopenia. No fracture or dislocation seen. Extensive lower lumbar spine degenerative changes and mild to moderate scoliosis. Atheromatous arterial calcifications. IMPRESSION: 1. No acute abnormality. 2. Extensive lower lumbar spine degenerative changes and mild to moderate scoliosis. Electronically Signed   By: Claudie Revering M.D.   On: 02/27/2019 10:59   Dg Shoulder Right  Result Date: 02/27/2019 CLINICAL DATA:  Back and neck pain, patient found on the floor this morning. EXAM: RIGHT SHOULDER - 2+ VIEW COMPARISON:  None. FINDINGS: Degenerative AC joint arthropathy. Reduced acromial humeral distance which can sometimes be seen in the setting of chronic rotator cuff tear. Thoracic spondylosis. Atherosclerotic calcification of the aortic arch. Prominent loss of articular space in the glenohumeral joint with associated spurring. IMPRESSION: 1. Reduced acromial humeral distance which can sometimes be seen in the setting of chronic rotator cuff tear. 2. Considerable degenerative glenohumeral arthropathy. 3. Thoracic spondylosis. 4. Atherosclerotic calcification of the aortic arch. Electronically Signed   By: Van Clines M.D.   On: 02/27/2019 13:35   Ct Head Wo Contrast  Result Date: 02/27/2019 CLINICAL  DATA:  Head trauma. Found down on floor this morning. Neck pain. EXAM: CT HEAD WITHOUT CONTRAST TECHNIQUE: Contiguous axial images were obtained from the base of the skull through the vertex without intravenous contrast. COMPARISON:  10/06/2015 head CT. FINDINGS: Brain: No evidence of parenchymal hemorrhage or extra-axial fluid collection. No mass lesion, mass effect, or midline shift. No CT evidence of acute infarction. Generalized cerebral volume loss. Nonspecific mild subcortical and periventricular white matter hypodensity, most in keeping with chronic small vessel ischemic change. No ventriculomegaly. Vascular: No acute abnormality. Skull: No evidence of calvarial fracture. Mild focal scalp fat stranding in the anterior left frontal scalp. Sinuses/Orbits: Complete opacification of the right maxillary sinus. Small polyp versus mucous retention cyst in the anterior left maxillary sinus. No fluid levels. Other:  The mastoid air cells are unopacified. IMPRESSION: 1. Possible small anterior left frontal scalp contusion. No evidence calvarial fracture. 2.  No evidence of acute intracranial abnormality. 3. Generalized cerebral volume loss and mild chronic small vessel ischemic changes in cerebral  white matter. 4. Bilateral maxillary sinusitis with complete right maxillary sinus opacification probably chronic. Electronically Signed   By: Ilona Sorrel M.D.   On: 02/27/2019 11:02   Ct Angio Chest Pe W And/or Wo Contrast  Result Date: 02/27/2019 CLINICAL DATA:  Found down, positive D-dimer EXAM: CT ANGIOGRAPHY CHEST WITH CONTRAST TECHNIQUE: Multidetector CT imaging of the chest was performed using the standard protocol during bolus administration of intravenous contrast. Multiplanar CT image reconstructions and MIPs were obtained to evaluate the vascular anatomy. CONTRAST:  48mL OMNIPAQUE IOHEXOL 350 MG/ML SOLN COMPARISON:  None. FINDINGS: Cardiovascular: Satisfactory opacification of the pulmonary arteries to the  segmental level. No evidence of pulmonary embolism. Cardiomegaly. Three-vessel coronary artery calcifications. No pericardial effusion. Aortic atherosclerosis. Mediastinum/Nodes: No enlarged mediastinal, hilar, or axillary lymph nodes. Thyroid gland, trachea, and esophagus demonstrate no significant findings. Lungs/Pleura: Lungs are clear. No pleural effusion or pneumothorax. Upper Abdomen: No acute abnormality. Musculoskeletal: No chest wall abnormality. No acute or significant osseous findings. Review of the MIP images confirms the above findings. IMPRESSION: 1.  Negative examination for pulmonary embolism. 2.  The cardiomegaly and coronary artery disease. 3.  Aortic Atherosclerosis (ICD10-I70.0). Electronically Signed   By: Eddie Candle M.D.   On: 02/27/2019 15:55   Ct Cervical Spine Wo Contrast  Result Date: 02/27/2019 CLINICAL DATA:  Found on floor this morning. Fall during the night. Back and neck pain. EXAM: CT CERVICAL SPINE WITHOUT CONTRAST TECHNIQUE: Multidetector CT imaging of the cervical spine was performed without intravenous contrast. Multiplanar CT image reconstructions were also generated. COMPARISON:  None. FINDINGS: Alignment: Normal. Skull base and vertebrae: No acute fracture. No primary bone lesion or focal pathologic process. Soft tissues and spinal canal: No prevertebral fluid or swelling. No visible canal hematoma. Disc levels: Multi level disc space narrowing and endplate spurring noted throughout the cervical spine. This is most advanced at C5-6 and C6-7. Upper chest: Negative. Other: Asymmetric opacification of the right maxillary sinus. IMPRESSION: 1. No evidence for cervical spine fracture. 2. Cervical degenerative disc disease. 3. Asymmetric opacification of the right maxillary sinus. Electronically Signed   By: Kerby Moors M.D.   On: 02/27/2019 10:58   Mr Brain Wo Contrast  Result Date: 02/27/2019 CLINICAL DATA:  Initial evaluation for acute trauma, fall. EXAM: MRI HEAD  WITHOUT CONTRAST TECHNIQUE: Multiplanar, multiecho pulse sequences of the brain and surrounding structures were obtained without intravenous contrast. COMPARISON:  Prior CT from earlier the same day as well as previous MRI from 04/07/2018. FINDINGS: Brain: Diffuse prominence of the CSF containing spaces compatible with generalized age-related cerebral atrophy. Patchy and confluent T2/FLAIR hyperintensity within the periventricular deep white matter both cerebral hemispheres most compatible with chronic microvascular ischemic disease, mild to moderate in nature. No abnormal foci of restricted diffusion to suggest acute ischemic infarct. Gray-white matter differentiation maintained. There is a small focus of cortical encephalomalacia seen involving the high left frontal lobe, consistent with a small chronic cortical infarct (series 10, image 24). Associated susceptibility artifact within this region consistent with chronic blood products (series 11, image 23). Few scattered foci of T1 hyperintensity within this region also consistent with blood products (series 13, image 22), likely late subacute. Finding is new relative to previous MRI from 2019, although felt not to be acute in nature on today's exam. No acute hemorrhage seen within this region on prior CT from earlier today. No other acute intracranial hemorrhage seen elsewhere within the brain. No mass lesion, midline shift or mass effect. Diffuse ventricular prominence related  to global parenchymal volume loss without hydrocephalus. No extra-axial fluid collection. Pituitary gland within normal limits. Vascular: Major intracranial vascular flow voids are maintained. Skull and upper cervical spine: Craniocervical junction within normal limits. Bone marrow signal intensity within normal limits. No appreciable scalp soft tissue injury. Sinuses/Orbits: Patient status post bilateral ocular lens replacement. Globes and orbital soft tissues demonstrate no acute finding.  Chronic right maxillary sinusitis. Small retention cyst noted within the left maxillary sinus. Paranasal sinuses are otherwise clear. No mastoid effusion. Other: None. IMPRESSION: 1. No acute intracranial abnormality. 2. 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 While this finding is new relative to previous MRI from December of 2019, this is not acute in appearance on today's exam. No acute intracranial hemorrhage seen within this region on prior CT from earlier the same day. 3. Underlying age-related cerebral atrophy with mild to moderate chronic microvascular ischemic disease. Electronically Signed   By: Jeannine Boga M.D.   On: 02/27/2019 16:57   Dg Chest Port 1 View  Result Date: 02/27/2019 CLINICAL DATA:  Found on the floor this morning. EXAM: PORTABLE CHEST 1 VIEW COMPARISON:  12/03/2013 FINDINGS: Stable mildly enlarged cardiac silhouette. Tortuous and calcified thoracic aorta. Clear lungs with mildly prominent interstitial markings. Normal vascularity. No fracture, pneumothorax or pleural fluid seen. Diffuse osteopenia. Lower thoracic spine degenerative changes. Mild scoliosis. Cholecystectomy clips. IMPRESSION: 1. No acute abnormality. Stable mild cardiomegaly. 2. Mild chronic interstitial lung disease. 3. Aortic atherosclerosis. Electronically Signed   By: Claudie Revering M.D.   On: 02/27/2019 10:58    Scheduled Meds: . pantoprazole (PROTONIX) IV  40 mg Intravenous Q24H  . sertraline  25 mg Oral Daily  . sodium chloride flush  3 mL Intravenous Q12H  . tamsulosin  0.4 mg Oral QPC supper   Continuous Infusions: . 0.9 % NaCl with KCl 20 mEq / L      Assessment/Plan:  1. Subacute to chronic hemorrhagic stroke.  Patient presented with acute metabolic encephalopathy.  Limited on treatment options with hemorrhagic stroke.  We will get neuro consultation.  OT PT and speech therapy consultation.  Patient failed swallow eval.  Keep  n.p.o.  Continue to monitor.  Patient made a DO NOT RESUSCITATE with patient and this was confirmed with the patient's POA. 2. Mild rhabdomyolysis continue IV fluids and hold statin 3. Severe hypokalemia replace potassium and IV fluids 4. Hypomagnesemia this was replaced into the normal range. 5. Hyperlipidemia hold cholesterol medication 6. GERD on IV Protonix 7. Accelerated hypertension on presentation.  Allow permissive hypertension.  May need to start clonidine patch later if blood pressure consistently high.  Code Status:     Code Status Orders  (From admission, onward)         Start     Ordered   02/28/19 0754  Do not attempt resuscitation (DNR)  Continuous    Question Answer Comment  In the event of cardiac or respiratory ARREST Do not call a "code blue"   In the event of cardiac or respiratory ARREST Do not perform Intubation, CPR, defibrillation or ACLS   In the event of cardiac or respiratory ARREST Use medication by any route, position, wound care, and other measures to relive pain and suffering. May use oxygen, suction and manual treatment of airway obstruction as needed for comfort.   Comments nurse may pronounce      02/28/19 0753        Code Status History  Date Active Date Inactive Code Status Order ID Comments User Context   02/27/2019 1254 02/28/2019 0753 Full Code [id number]  Para Skeans, MD ED   Advance Care Planning Activity     Family Communication: Spoke with POA Cindie Laroche on the phone Disposition Plan: Likely will end up needing rehab  Consultants:  Neurology  Time spent: 35 minutes Case discussed with neurology, nursing staff and POA.  [place]  [facility] [employer]

## 2019-02-28 NOTE — Progress Notes (Signed)
  BRIEF OVERNIGHT  REPORT  SUBJECTIVE: Request per admitting provider to follow up on labs, imaging and patient's assessment.  OBJECTIVE: On arrival to the bedside, there were no obvious focal neurological deficits; he was alert but disoriented to person, place, time and situation.  ASSESSMENT:85 y.o. male with medical history significant of hypertension, dementia, GERD, found down at home. Unclear how long he had been on the floor.  PLAN 1. Acute encephalopathy - Patient found down unclear how long he had been  Down or etiology. Patient lives by himself - CT head shows small contusion otherwise negative for acute bleed or infarct - MRI brain shows  Late subacute to chronic hemorrhagic cortical infarct - US carotids pending - CT angio chest negative for PE or acute cardiopulmonary process - B12, TSH, T4 normal - UA shows no evidence of UTI - Will check EEG - Check CK *-  Can consider Neurology consult if appropriate - Social work Optometrist, patient lives by himself may need placement - PT/OT consult  2. Elevated troponin - No changes in EKG, likely demand ischemia from hypertensive urgency - No c/o chest pain - Repeat EKG - Can consider cardiology consult if concerning for ACS - Will obtain Echo, eval for heart strain  3. Hypertensive emergency-  Patient presenting with elevated BP in the 200 s with associated target-organ dysfunction. Hx limited as to wether he is taking his medication. Patient lives by himself. - PRN hydralazine or IV labetalol - Imaging as above no evidence of PE, ICH/SAH or pulmonary edema  4. Leukocytosis : WBC 15.5, reactive? - UA negative - Pending urine culture - Will obtain blood cultures as well - COVID negative - Trend WBC's and Procalcitonin  5. Hypokalemia and Hypomagnesium - Replete with IV potassium and Magnesium - Recheck labs   DVT prophylaxis - Hold anti-coagulation for late subacute hemorrhagic infarct noted on MRI pending neurology  input.    Rufina Falco, DNP, CCRN, FNP-C [facility] Nurse Practitioner Between 7pm to 7am - Pager [phone] Actively using [employer] secure chat messaging  After 7am go to [email or web address] - [email or web address] select [facility]  [facility] [employer]  [phone]

## 2019-02-28 NOTE — Progress Notes (Signed)
*  PRELIMINARY RESULTS* Echocardiogram 2D Echocardiogram has been performed.  Billy Bennett 02/28/2019, 12:06 PM

## 2019-02-28 NOTE — Evaluation (Signed)
Physical Therapy Evaluation Patient Details Name: Billy Bennett. MRN: [id number] DOB: 15-Jun-1923 Today's Date: 02/28/2019   History of Present Illness  Pt is a 85 year old male that presented to the ED after being found by neighbor on floor for unknown period of time. Patient does not remember having a fall or when this was. Patient endorses 4 falls in the past 2 weeks, reports his legs are very weak. PMH: HTN, dementia, GERD. At baseline lives at home alone, has a son that checks on him and they go to breakfast a couple times a week. Reports he does his own cooking, cleaning, ADLs; uses rollator in his home and community, does not drive (his son takes him to his appts and to the grocery).  Clinical Impression  Pt is a 85 year old male presenting to the ED post fall, was found by neighbor after unknown amount of time. Patient endorses 4 falls in the past 2 weeks. Imapriments in LE strength, activity tolerance, balance, and gait abnormalities impairing patients ability to complete transfers and ambulation ind and safely; inhibiting participation in ADLs. Patient lives home alone and completes all ADLs independently. He does not drive, but when his son takes him out to eat and to the grocery store he reports he is able to get around modI with his rollator (chich he uses in the home as well). At this time, patient is unsafe to resume living home alone, and PT recommendation is SNF. Would benefit from skilled PT to address above deficits and promote optimal return to PLOF.     Follow Up Recommendations SNF    Equipment Recommendations  Rolling walker with 5" wheels    Recommendations for Other Services OT consult     Precautions / Restrictions Precautions Precautions: Fall      Mobility  Bed Mobility Overal bed mobility: Needs Assistance Bed Mobility: Supine to Sit     Supine to sit: Min assist     General bed mobility comments: asssitance at LEs  Transfers Overall transfer  level: Needs assistance Equipment used: Rolling walker (2 wheeled) Transfers: Sit to/from Stand Sit to Stand: Min guard;Min assist         General transfer comment: minA to initiate stand, CGA to sit with cuing for hand placement and set up of both for safety  Ambulation/Gait Ambulation/Gait assistance: Min assist Gait Distance (Feet): 50 Feet Assistive device: Rolling walker (2 wheeled) Gait Pattern/deviations: Shuffle Gait velocity: decreased   General Gait Details: minA to direct RW with turning, very kyphotic, shuffle gait. Multiple cues for foot clearance and to stay inside walker without much success. Loose bowel throughout amb  Stairs            Wheelchair Mobility    Modified Rankin (Stroke Patients Only)       Balance Overall balance assessment: Needs assistance;History of Falls Sitting-balance support: No upper extremity supported;Feet supported Sitting balance-Leahy Scale: Fair     Standing balance support: Bilateral upper extremity supported;During functional activity Standing balance-Leahy Scale: Fair                               Pertinent Vitals/Pain Pain Assessment: No/denies pain    Home Living Family/patient expects to be discharged to:: Private residence Living Arrangements: Alone Available Help at Discharge: Family Type of Home: House Home Access: Level entry     Home Layout: One level Home Equipment: Environmental consultant - 4 wheels;Cane - single  point Additional Comments: uses rollator at home and in community    Prior Function Level of Independence: Independent with assistive device(s)         Comments: Reports he has had 4 falls in past 2 weeks     Hand Dominance   Dominant Hand: Right    Extremity/Trunk Assessment   Upper Extremity Assessment Upper Extremity Assessment: Overall WFL for tasks assessed    Lower Extremity Assessment Lower Extremity Assessment: Generalized weakness    Cervical / Trunk  Assessment Cervical / Trunk Assessment: Normal  Communication   Communication: HOH  Cognition Arousal/Alertness: Awake/alert Behavior During Therapy: WFL for tasks assessed/performed                                   General Comments: Ox4      General Comments      Exercises Other Exercises Other Exercises: supine to sit: patient able to comply with cuing for technique with use of bedrails. Assitance needed to assited LEs to EOB. once EOB abloe to maintain sitting with UE support, difficulty balancing without Other Exercises: STS: cuing for hand placement and set up and minA to initiate stand needed. Able to sit without physical assist., but max cuing for technique, hand placement and safety with lowering Other Exercises: AMb 25 around room with RW with minA for RW negotiation with turns. Max cuing for foot clearance and to "stay close to walker" with little success. Loose bowel throughout amb, patient reports he cannot feel this.   Assessment/Plan    PT Assessment Patient needs continued PT services  PT Problem List Decreased strength;Decreased mobility;Decreased range of motion;Decreased coordination;Decreased activity tolerance;Decreased balance;Decreased safety awareness       PT Treatment Interventions Gait training;Therapeutic exercise;Patient/family education;DME instruction;Therapeutic activities;Stair training;Balance training;Functional mobility training;Neuromuscular re-education;Manual techniques    PT Goals (Current goals can be found in the Care Plan section)  Acute Rehab PT Goals Patient Stated Goal: Go home PT Goal Formulation: With patient Time For Goal Achievement: 03/14/19 Potential to Achieve Goals: Fair    Frequency Min 2X/week   Barriers to discharge Decreased caregiver support Lives alone and is falling frequently    Co-evaluation               AM-PAC PT "6 Clicks" Mobility  Outcome Measure Help needed turning from your back to  your side while in a flat bed without using bedrails?: A Lot Help needed moving from lying on your back to sitting on the side of a flat bed without using bedrails?: A Lot Help needed moving to and from a bed to a chair (including a wheelchair)?: A Little Help needed standing up from a chair using your arms (e.g., wheelchair or bedside chair)?: A Little Help needed to walk in hospital room?: A Little Help needed climbing 3-5 steps with a railing? : Total 6 Click Score: 14    End of Session Equipment Utilized During Treatment: Gait belt Activity Tolerance: Patient tolerated treatment well Patient left: in chair;with chair alarm set;with nursing/sitter in room;with call bell/phone within reach Nurse Communication: Mobility status PT Visit Diagnosis: Unsteadiness on feet (R26.81);Muscle weakness (generalized) (M62.81);Other abnormalities of gait and mobility (R26.89);Repeated falls (R29.6);History of falling (Z91.81);Difficulty in walking, not elsewhere classified (R26.2)    Time: [id number] PT Time Calculation (min) (ACUTE ONLY): 39 min   Charges:   PT Evaluation $PT Eval Moderate Complexity: 1 Mod PT Treatments $Gait Training: 8-22 mins $  Therapeutic Activity: 38-52 mins        Shelton Silvas PT, DPT  Shelton Silvas 02/28/2019, 5:09 PM

## 2019-03-01 LAB — BASIC METABOLIC PANEL
Anion gap: 8 (ref 5–15)
BUN: 27 mg/dL — ABNORMAL HIGH (ref 8–23)
CO2: 24 mmol/L (ref 22–32)
Calcium: 8.6 mg/dL — ABNORMAL LOW (ref 8.9–10.3)
Chloride: 111 mmol/L (ref 98–111)
Creatinine, Ser: 0.85 mg/dL (ref 0.61–1.24)
GFR calc Af Amer: >60 mL/min (ref >60)
GFR calc non Af Amer: >60 mL/min (ref >60)
Glucose, Bld: 94 mg/dL (ref 70–99)
Potassium: 3 mmol/L — ABNORMAL LOW (ref 3.5–5.1)
Sodium: 143 mmol/L (ref 135–145)

## 2019-03-01 LAB — URINE CULTURE

## 2019-03-01 LAB — ECHOCARDIOGRAM COMPLETE
Height: 65 in
Weight: 2496 oz

## 2019-03-01 LAB — MAGNESIUM: Magnesium: 1.9 mg/dL (ref 1.7–2.4)

## 2019-03-01 LAB — CK: Total CK: 393 U/L (ref 49–397)

## 2019-03-01 MED ORDER — AMLODIPINE BESYLATE 5 MG PO TABS
5.0000 mg | ORAL_TABLET | Freq: Every day | ORAL | Status: DC
  Administered 2019-03-01 – 2019-03-02 (×2): 5 mg via ORAL
  Filled 2019-03-01 (×3): qty 1

## 2019-03-01 MED ORDER — LORAZEPAM 2 MG/ML IJ SOLN
1.0000 mg | Freq: Once | INTRAMUSCULAR | Status: AC
  Administered 2019-03-02: 1 mg via INTRAVENOUS
  Filled 2019-03-01: qty 1

## 2019-03-01 MED ORDER — POTASSIUM CHLORIDE 20 MEQ PO PACK
20.0000 meq | PACK | Freq: Two times a day (BID) | ORAL | Status: DC
  Administered 2019-03-01: 20 meq via ORAL
  Filled 2019-03-01 (×2): qty 1

## 2019-03-01 NOTE — NC FL2 (Signed)
[place] LEVEL OF CARE SCREENING TOOL     IDENTIFICATION  Patient Name: Billy Bennett. Birthdate: [birth year] Sex: male Admission Date (Current Location): 02/27/2019  [place] and [place] Number:  Engineering geologist and Address:  [facility], [address], [place], [place] [address]      Provider Number: [id number]  Attending Physician Name and Address:  Loletha Grayer, MD  Relative Name and Phone Number:  Day[place], Kosakowski   [phone]    Current Level of Care: Hospital Recommended Level of Care: [place] Prior Approval Number:    Date Approved/Denied:   PASRR Number: [id number]  Discharge Plan: SNF    Current Diagnoses: Patient Active Problem List   Diagnosis Date Noted  . Hemorrhagic stroke ([place])   . Traumatic rhabdomyolysis ([place])   . Gastroesophageal reflux disease without esophagitis   . Head injury   . Acute metabolic encephalopathy [id number]  . Hypokalemia 02/27/2019  . Elevated troponin 02/27/2019  . Fall   . At high risk for falls 08/28/2017  . Fecal smearing 07/01/2017  . Other constipation 07/01/2017  . Heart murmur, systolic [id number]  . Skin cancer 08/22/2016  . Status post arthroscopy 07/16/2016  . Pure hypercholesterolemia 04/01/2011  . Hypertension 01/20/2011    Orientation RESPIRATION BLADDER Height & Weight     Self  Normal Continent Weight: 156 lb (70.8 kg) Height:  5\' 5"  (165.1 cm)  BEHAVIORAL SYMPTOMS/MOOD NEUROLOGICAL BOWEL NUTRITION STATUS      Continent Diet(Dysphagia 2 diet)  AMBULATORY STATUS COMMUNICATION OF NEEDS Skin   Limited Assist Verbally Normal                       Personal Care Assistance Level of Assistance  Bathing, Dressing, Feeding Bathing Assistance: Limited assistance Feeding assistance: Independent Dressing Assistance: Limited assistance Total Care Assistance: Limited assistance   Functional Limitations Info  Sight, Speech,  Hearing Sight Info: Adequate Hearing Info: Adequate Speech Info: Adequate    SPECIAL CARE FACTORS FREQUENCY  PT (By licensed PT), OT (By licensed OT)     PT Frequency: Minimum 5x a week OT Frequency: Minimum 5x a week            Contractures Contractures Info: Not present    Additional Factors Info  Code Status, Allergies, Psychotropic Code Status Info: DNR Allergies Info: Ace Inhibitors Telmisartan-hctz Amoxicillin-pot Clavulanate Ibuprofen Psychotropic Info: sertraline (ZOLOFT) tablet 25 mg         Current Medications (03/01/2019):  This is the current hospital active medication list Current Facility-Administered Medications  Medication Dose Route Frequency Provider Last Rate Last Dose  . 0.9 % NaCl with KCl 20 mEq/ L  infusion   Intravenous Continuous Loletha Grayer, MD 40 mL/hr at 03/01/19 1550    . amLODipine (NORVASC) tablet 5 mg  5 mg Oral Daily Loletha Grayer, MD   5 mg at 03/01/19 1553  . labetalol (NORMODYNE) injection 10 mg  10 mg Intravenous Q2H PRN Wieting, Richard, MD      . pantoprazole (PROTONIX) injection 40 mg  40 mg Intravenous Q24H Para Skeans, MD   40 mg at 03/01/19 1218  . potassium chloride (KLOR-CON) packet 20 mEq  20 mEq Oral BID Loletha Grayer, MD   20 mEq at 03/01/19 0927  . sertraline (ZOLOFT) tablet 25 mg  25 mg Oral Daily Para Skeans, MD   25 mg at 03/01/19 [id number]  . sodium chloride flush (NS) 0.9 % injection  3 mL  3 mL Intravenous Q12H Para Skeans, MD   3 mL at 02/27/19 2143  . tamsulosin (FLOMAX) capsule 0.4 mg  0.4 mg Oral QPC supper Para Skeans, MD   0.4 mg at 03/01/19 1556     Discharge Medications: Please see discharge summary for a list of discharge medications.  Relevant Imaging Results:  Relevant Lab Results:   Additional Information SSN [id number]  Ross Ludwig, LCSW

## 2019-03-01 NOTE — Progress Notes (Signed)
OT Cancellation Note  Patient Details Name: Billy Bennett. MRN: [id number] DOB: [birth year]   Cancelled Treatment:    Reason Eval/Treat Not Completed: Patient at procedure or test/ unavailable. Consult received, chart reviewed. Pt out of room for testing. Will re-attempt OT evaluation at later date/time as pt is available and medically appropriate.   Jeni Salles, MPH, MS, OTR/L ascom [phone] 03/01/19, 2:55 PM

## 2019-03-01 NOTE — Progress Notes (Signed)
eeg completed ° °

## 2019-03-01 NOTE — Procedures (Signed)
ELECTROENCEPHALOGRAM REPORT   Patient: Billy Bennett.       Room #: 249A-AA EEG No. ID: [id number] Age: 85 y.o.        Sex: male Referring Physician: Leslye Peer Report Date:  03/01/2019        Interpreting Physician: Alexis Goodell  History: Yair Rehor. is an 85 y.o. male found down  Medications:  Norvasc, Zoloft, Flomax, Protonix  Conditions of Recording:  This is a 21 channel routine scalp EEG performed with bipolar and monopolar montages arranged in accordance to the international 10/20 system of electrode placement. One channel was dedicated to EKG recording.  The patient is in the awake and drowsy states.  Description:  The waking background activity consists of a low voltage, symmetrical, fairly well organized, 9 Hz alpha activity, seen from the parieto-occipital and posterior temporal regions.  Low voltage fast activity, poorly organized, is seen anteriorly and is at times superimposed on more posterior regions.  A mixture of theta and alpha rhythms are seen from the central and temporal regions. The patient drowses with slowing to irregular, low voltage theta and beta activity.   Stage II sleep is not obtained. No epileptiform activity is noted.   Hyperventilation was not performed. Intermittent photic stimulation was performed but failed to illicit any change in the tracing.   IMPRESSION: Normal electroencephalogram, awake, drowsy and with activation procedures. There are no focal lateralizing or epileptiform features.   Alexis Goodell, MD Neurology [phone] 03/01/2019, 5:50 PM

## 2019-03-01 NOTE — Progress Notes (Signed)
Patient ID: Billy Bennett., male   DOB: 27-Nov-1923, 85 y.o.   MRN: [id number] [facility] PROGRESS NOTE  Billy Bennett. [id number] DOB: 1923/08/04 DOA: 02/27/2019 PCP: Billy Curry, MD  HPI/Subjective: Patient more alert today than yesterday.  Answered more questions.  He did not remember seeing me yesterday.  He was surprised when I told him he had a stroke.  Does not complain of any weakness one side versus the other.  Objective: Vitals:   03/01/19 0411 03/01/19 0721  BP: (!) 182/67 (!) 166/67  Pulse: 74 71  Resp: 20 19  Temp: 97.7 F (36.5 C) (!) 97.4 F (36.3 C)  SpO2: 97% 99%    Intake/Output Summary (Last 24 hours) at 03/01/2019 1407 Last data filed at 03/01/2019 0900 Gross per 24 hour  Intake 1107.93 ml  Output -  Net 1107.93 ml   Filed Weights   02/27/19 0947 02/28/19 0411  Weight: 72.6 kg 70.8 kg    ROS: Review of Systems  Constitutional: Negative for chills and fever.  Eyes: Negative for blurred vision.  Respiratory: Negative for cough and shortness of breath.   Cardiovascular: Negative for chest pain.  Gastrointestinal: Negative for abdominal pain, constipation, diarrhea, nausea and vomiting.  Genitourinary: Negative for dysuria.  Musculoskeletal: Negative for joint pain.  Neurological: Negative for dizziness and headaches.   Exam: Physical Exam  HENT:  Nose: No mucosal edema.  Mouth/Throat: No oropharyngeal exudate.  Eyes: Pupils are equal, round, and reactive to light. Conjunctivae and lids are normal.  Neck: Carotid bruit is not present. No thyromegaly present.  Cardiovascular: Regular rhythm, S1 normal and S2 normal.  Murmur heard.  Systolic murmur is present with a grade of 2/6. Respiratory: He has no decreased breath sounds. He has no wheezes. He has no rhonchi. He has no rales.  GI: Soft. Bowel sounds are normal. There is no abdominal tenderness.  Musculoskeletal:     Right ankle: He exhibits no swelling.     Left  ankle: He exhibits no swelling.  Neurological: He is alert.  Able to straight leg raise bilaterally.  Skin: Skin is warm. No rash noted. Nails show no clubbing.  Skin tear right leg  Psychiatric: He has a normal mood and affect.      Data Reviewed: Basic Metabolic Panel: Recent Labs  Lab 02/27/19 0953 02/27/19 1324 02/28/19 0302 02/28/19 1505 03/01/19 0504  NA 141  --  141  --  143  K 2.9*  --  2.9* 2.9* 3.0*  CL 105  --  109  --  111  CO2 27  --  23  --  24  GLUCOSE 135*  --  85  --  94  BUN 19  --  18  --  27*  CREATININE 0.72  --  0.81  --  0.85  CALCIUM 9.0  --  8.5*  --  8.6*  MG  --  1.6* 2.0  --  1.9  PHOS  --  3.2  --   --   --    Liver Function Tests: Recent Labs  Lab 02/27/19 0953  AST 53*  ALT 31  ALKPHOS 81  BILITOT 1.0  PROT 7.1  ALBUMIN 4.1   CBC: Recent Labs  Lab 02/27/19 0953  WBC 15.5*  NEUTROABS 13.8*  HGB 14.1  HCT 41.2  MCV 85.1  PLT 211   Cardiac Enzymes: Recent Labs  Lab 02/27/19 1736 03/01/19 0504  CKTOTAL 897* 393     Recent Results (  from the past 240 hour(s))  SARS CORONAVIRUS 2 (TAT 6-24 HRS) Nasopharyngeal Nasopharyngeal Swab     Status: None   Collection Time: 02/27/19 12:51 PM   Specimen: Nasopharyngeal Swab  Result Value Ref Range Status   SARS Coronavirus 2 NEGATIVE NEGATIVE Final    Comment: (NOTE) SARS-CoV-2 target nucleic acids are NOT DETECTED. The SARS-CoV-2 RNA is generally detectable in upper and lower respiratory specimens during the acute phase of infection. Negative results do not preclude SARS-CoV-2 infection, do not rule out co-infections with other pathogens, and should not be used as the sole basis for treatment or other patient management decisions. Negative results must be combined with clinical observations, patient history, and epidemiological information. The expected result is Negative. Fact Sheet for Patients: SugarRoll.be Fact Sheet for Healthcare  Providers: https://www.woods-mathews.com/ This test is not yet approved or cleared by the [place] FDA and  has been authorized for detection and/or diagnosis of SARS-CoV-2 by FDA under an Emergency Use Authorization (EUA). This EUA will remain  in effect (meaning this test can be used) for the duration of the COVID-19 declaration under Section 56 4(b)(1) of the Act, 21 U.S.C. section 360bbb-3(b)(1), unless the authorization is terminated or revoked sooner. Performed at [facility], [address], [place], [place] [address]   Urine culture     Status: Abnormal   Collection Time: 02/27/19  1:24 PM   Specimen: Urine, Random  Result Value Ref Range Status   Specimen Description   Final    URINE, RANDOM Performed at [facility], [address] [place], [place] [address]    Special Requests   Final    NONE Performed at [facility], [address] [place], [place] [address]    Culture MULTIPLE SPECIES PRESENT, SUGGEST RECOLLECTION (A)  Final   Report Status 03/01/2019 FINAL  Final     Studies: Ct Angio Chest Pe W And/or Wo Contrast  Result Date: 02/27/2019 CLINICAL DATA:  Found down, positive D-dimer EXAM: CT ANGIOGRAPHY CHEST WITH CONTRAST TECHNIQUE: Multidetector CT imaging of the chest was performed using the standard protocol during bolus administration of intravenous contrast. Multiplanar CT image reconstructions and MIPs were obtained to evaluate the vascular anatomy. CONTRAST:  30mL OMNIPAQUE IOHEXOL 350 MG/ML SOLN COMPARISON:  None. FINDINGS: Cardiovascular: Satisfactory opacification of the pulmonary arteries to the segmental level. No evidence of pulmonary embolism. Cardiomegaly. Three-vessel coronary artery calcifications. No pericardial effusion. Aortic atherosclerosis. Mediastinum/Nodes: No enlarged mediastinal, hilar, or axillary lymph nodes. Thyroid gland, trachea, and esophagus demonstrate no significant findings.  Lungs/Pleura: Lungs are clear. No pleural effusion or pneumothorax. Upper Abdomen: No acute abnormality. Musculoskeletal: No chest wall abnormality. No acute or significant osseous findings. Review of the MIP images confirms the above findings. IMPRESSION: 1.  Negative examination for pulmonary embolism. 2.  The cardiomegaly and coronary artery disease. 3.  Aortic Atherosclerosis (ICD10-I70.0). Electronically Signed   By: Eddie Candle M.D.   On: 02/27/2019 15:55   Mr Brain Wo Contrast  Result Date: 02/27/2019 CLINICAL DATA:  Initial evaluation for acute trauma, fall. EXAM: MRI HEAD WITHOUT CONTRAST TECHNIQUE: Multiplanar, multiecho pulse sequences of the brain and surrounding structures were obtained without intravenous contrast. COMPARISON:  Prior CT from earlier the same day as well as previous MRI from 04/07/2018. FINDINGS: Brain: Diffuse prominence of the CSF containing spaces compatible with generalized age-related cerebral atrophy. Patchy and confluent T2/FLAIR hyperintensity within the periventricular deep white matter both cerebral hemispheres most compatible with chronic microvascular ischemic disease, mild  to moderate in nature. No abnormal foci of restricted diffusion to suggest acute ischemic infarct. Gray-white matter differentiation maintained. There is a small focus of cortical encephalomalacia seen involving the high left frontal lobe, consistent with a small chronic cortical infarct (series 10, image 24). Associated susceptibility artifact within this region consistent with chronic blood products (series 11, image 23). Few scattered foci of T1 hyperintensity within this region also consistent with blood products (series 13, image 22), likely late subacute. Finding is new relative to previous MRI from 2019, although felt not to be acute in nature on today's exam. No acute hemorrhage seen within this region on prior CT from earlier today. No other acute intracranial hemorrhage seen elsewhere  within the brain. No mass lesion, midline shift or mass effect. Diffuse ventricular prominence related to global parenchymal volume loss without hydrocephalus. No extra-axial fluid collection. Pituitary gland within normal limits. Vascular: Major intracranial vascular flow voids are maintained. Skull and upper cervical spine: Craniocervical junction within normal limits. Bone marrow signal intensity within normal limits. No appreciable scalp soft tissue injury. Sinuses/Orbits: Patient status post bilateral ocular lens replacement. Globes and orbital soft tissues demonstrate no acute finding. Chronic right maxillary sinusitis. Small retention cyst noted within the left maxillary sinus. Paranasal sinuses are otherwise clear. No mastoid effusion. Other: None. IMPRESSION: 1. No acute intracranial abnormality. 2. 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 While this finding is new relative to previous MRI from December of 2019, this is not acute in appearance on today's exam. No acute intracranial hemorrhage seen within this region on prior CT from earlier the same day. 3. Underlying age-related cerebral atrophy with mild to moderate chronic microvascular ischemic disease. Electronically Signed   By: Jeannine Boga M.D.   On: 02/27/2019 16:57   US Carotid Bilateral  Result Date: 02/28/2019 CLINICAL DATA:  Encephalopathy. History of hypertension, hyperlipidemia, syncopal episode and stroke. EXAM: BILATERAL CAROTID DUPLEX ULTRASOUND TECHNIQUE: Pearline Cables scale imaging, color Doppler and duplex ultrasound were performed of bilateral carotid and vertebral arteries in the neck. COMPARISON:  None. FINDINGS: Criteria: Quantification of carotid stenosis is based on velocity parameters that correlate the residual internal carotid diameter with NASCET-based stenosis levels, using the diameter of the distal internal carotid lumen as the denominator for stenosis  measurement. The following velocity measurements were obtained: RIGHT ICA: 58/12 cm/sec CCA: [id number] cm/sec SYSTOLIC ICA/CCA RATIO:  1.2 ECA: 87 cm/sec LEFT ICA: 77/14 cm/sec CCA: [id number] cm/sec SYSTOLIC ICA/CCA RATIO:  1.7 ECA: 123 cm/sec RIGHT CAROTID ARTERY: There is a moderate amount of eccentric echogenic plaque within the right carotid bulb (images 14 and 15), extending to involve the origin and proximal aspects of the right internal carotid artery (image 22, not resulting in elevated peak systolic velocities within the interrogated course the right internal carotid artery to suggest a hemodynamically significant stenosis. RIGHT VERTEBRAL ARTERY:  Antegrade flow LEFT CAROTID ARTERY: There is a minimal amount of eccentric mixed echogenic plaque with the mid aspect the left common carotid artery (image 40). There is a moderate amount of eccentric mixed echogenic plaque within the left carotid bulb (image 48), extending to involve the origin and proximal aspects of the left internal carotid artery (image 55, not resulting in elevated peak systolic velocities within the interrogated course the left internal carotid artery to suggest a hemodynamically significant stenosis. LEFT VERTEBRAL ARTERY:  Antegrade flow IMPRESSION: Moderate amount of bilateral atherosclerotic plaque, left subjectively greater than right, not resulting in a  hemodynamically significant stenosis within either internal carotid artery. Electronically Signed   By: Sandi Mariscal M.D.   On: 02/28/2019 08:06    Scheduled Meds: . pantoprazole (PROTONIX) IV  40 mg Intravenous Q24H  . potassium chloride  20 mEq Oral BID  . sertraline  25 mg Oral Daily  . sodium chloride flush  3 mL Intravenous Q12H  . tamsulosin  0.4 mg Oral QPC supper   Continuous Infusions: . 0.9 % NaCl with KCl 20 mEq / L 50 mL/hr at 03/01/19 0129    Assessment/Plan:  1. Subacute to chronic hemorrhagic stroke.  Patient presented with acute metabolic encephalopathy.  Limited  on treatment options with hemorrhagic stroke. Appreciate neuro consultation.  OT PT and speech therapy consultations.  Patient was able to swallow last night and was placed on dysphagia diet.  Awaiting speech therapy consultation to try to advance his diet further.  Physical therapy recommended rehab. 2. Mild rhabdomyolysis continue gentle IV fluids and hold statin. 3. Hypokalemia replace potassium orally and in IV fluids. 4. Hypomagnesemia this was replaced into the normal range. 5. Hyperlipidemia hold cholesterol medication 6. GERD on IV Protonix 7. Accelerated hypertension on presentation.  Blood pressure still high start Norvasc 5 mg daily 8. BPH on Flomax  Code Status:     Code Status Orders  (From admission, onward)         Start     Ordered   02/28/19 0754  Do not attempt resuscitation (DNR)  Continuous    Question Answer Comment  In the event of cardiac or respiratory ARREST Do not call a "code blue"   In the event of cardiac or respiratory ARREST Do not perform Intubation, CPR, defibrillation or ACLS   In the event of cardiac or respiratory ARREST Use medication by any route, position, wound care, and other measures to relive pain and suffering. May use oxygen, suction and manual treatment of airway obstruction as needed for comfort.   Comments nurse may pronounce      02/28/19 0753        Code Status History    Date Active Date Inactive Code Status Order ID Comments User Context   02/27/2019 1254 02/28/2019 0753 Full Code [id number]  Para Skeans, MD ED   Advance Care Planning Activity     Family Communication: Spoke with POA Cindie Laroche on the phone Disposition Plan: Rehab in the next day or so  Consultants:  Neurology  Time spent: 28 minutes  [place]

## 2019-03-01 NOTE — Progress Notes (Signed)
SLP Cancellation Note  Patient Details Name: Billy Bennett. MRN: [id number] DOB: 1923-12-31   Cancelled treatment:       Reason Eval/Treat Not Completed: Patient at procedure or test/unavailable  Leroy Sea, MS/CCC- SLP  Lou Miner 03/01/2019, 1:47 PM

## 2019-03-01 NOTE — Plan of Care (Signed)
  Problem: Clinical Measurements: Goal: Will remain free from infection Outcome: Progressing   Problem: Clinical Measurements: Goal: Respiratory complications will improve Outcome: Progressing   Problem: Clinical Measurements: Goal: Diagnostic test results will improve Outcome: Progressing

## 2019-03-02 LAB — BASIC METABOLIC PANEL
Anion gap: 4 — ABNORMAL LOW (ref 5–15)
BUN: 24 mg/dL — ABNORMAL HIGH (ref 8–23)
CO2: 25 mmol/L (ref 22–32)
Calcium: 8.5 mg/dL — ABNORMAL LOW (ref 8.9–10.3)
Chloride: 112 mmol/L — ABNORMAL HIGH (ref 98–111)
Creatinine, Ser: 0.7 mg/dL (ref 0.61–1.24)
GFR calc Af Amer: >60 mL/min (ref >60)
GFR calc non Af Amer: >60 mL/min (ref >60)
Glucose, Bld: 104 mg/dL — ABNORMAL HIGH (ref 70–99)
Potassium: 3.1 mmol/L — ABNORMAL LOW (ref 3.5–5.1)
Sodium: 141 mmol/L (ref 135–145)

## 2019-03-02 LAB — LIPID PANEL
Cholesterol: 163 mg/dL (ref 0–200)
HDL: 42 mg/dL (ref >40)
LDL Cholesterol: 73 mg/dL (ref 0–99)
Total CHOL/HDL Ratio: 3.9 RATIO
Triglycerides: 239 mg/dL — ABNORMAL HIGH (ref <150)
VLDL: 48 mg/dL — ABNORMAL HIGH (ref 0–40)

## 2019-03-02 LAB — ROCKY MTN SPOTTED FVR ABS PNL(IGG+IGM)
RMSF IgG: NEGATIVE
RMSF IgM: 0.5 index (ref 0.00–0.89)

## 2019-03-02 MED ORDER — CLONIDINE HCL 0.1 MG PO TABS
0.1000 mg | ORAL_TABLET | Freq: Two times a day (BID) | ORAL | Status: DC
  Administered 2019-03-02 (×2): 0.1 mg via ORAL
  Filled 2019-03-02 (×3): qty 1

## 2019-03-02 MED ORDER — POTASSIUM CHLORIDE 20 MEQ PO PACK
40.0000 meq | PACK | Freq: Two times a day (BID) | ORAL | Status: DC
  Administered 2019-03-02 – 2019-03-03 (×3): 40 meq via ORAL
  Filled 2019-03-02 (×3): qty 2

## 2019-03-02 MED ORDER — PANTOPRAZOLE SODIUM 40 MG PO TBEC
40.0000 mg | DELAYED_RELEASE_TABLET | Freq: Every day | ORAL | Status: DC
  Administered 2019-03-03: 40 mg via ORAL
  Filled 2019-03-02: qty 1

## 2019-03-02 NOTE — Progress Notes (Signed)
PHARMACIST - PHYSICIAN COMMUNICATION  DR:   Leslye Peer  CONCERNING: IV to Oral Route Change Policy  RECOMMENDATION: This patient is receiving pantoprazole by the intravenous route.  Based on criteria approved by the Pharmacy and Therapeutics Committee, the intravenous medication(s) is/are being converted to the equivalent oral dose form(s).   DESCRIPTION: These criteria include:  The patient is eating (either orally or via tube) and/or has been taking other orally administered medications for a least 24 hours  The patient has no evidence of active gastrointestinal bleeding or impaired GI absorption (gastrectomy, short bowel, patient on TNA or NPO).  If you have questions about this conversion, please contact the Pharmacy Department   []   [phone] )  [place], PharmD, BCPS Clinical Pharmacist 03/02/2019 2:12 PM

## 2019-03-02 NOTE — Evaluation (Signed)
Clinical/Bedside Swallow Evaluation Patient Details  Name: Billy Bennett. MRN: [id number] Date of Birth: 1923-10-07  Today's Date: 03/02/2019 Time: SLP Start Time (ACUTE ONLY): 0830 SLP Stop Time (ACUTE ONLY): 0930 SLP Time Calculation (min) (ACUTE ONLY): 60 min  Past Medical History:  Past Medical History:  Diagnosis Date  . Anxiety   . Arthritis   . Enlarged prostate   . GERD (gastroesophageal reflux disease)   . Heart murmur, systolic [id number]  . HOH (hard of hearing)   . Hypercholesteremia   . Hypertension   . Other constipation 07/01/2017  . Pure hypercholesterolemia 04/01/2011  . Skin cancer 08/22/2016   Overview:  Patient sees Dr. Phillip Heal   Past Surgical History:  Past Surgical History:  Procedure Laterality Date  . ANAL FISSURE REPAIR    . CHOLECYSTECTOMY    . EYE SURGERY Bilateral    Cataract Extraction with IOL  . KNEE ARTHROSCOPY Right 07/08/2016   Procedure: ARTHROSCOPY KNEE, PARTIAL MEDIAL MENISECTOMY, CHONDROPLASTY;  Surgeon: Dereck Leep, MD;  Location: [facility] ORS;  Service: Orthopedics;  Laterality: Right;   HPI:  Pt is a 86 year old male that presented to the ED after being found by neighbor on floor for unknown period of time. Patient does not remember having a fall or when this was. Patient endorses 4 falls in the past 2 weeks, reports his legs are very weak.  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 PMH: HTN, dementia, GERD. At baseline lives at home alone, has a son that checks on him and they go to breakfast a couple times a week. Reports he does his own cooking, cleaning, ADLs; uses rollator in his home and community, does not drive (his son takes him to his appts and to the grocery).  MRI revealed: "Moderate chronic small-vessel ischemic changes of the cerebral hemispheric white matter; Brain atrophy with some frontal and temporal predominance." Newer finding: "Late subacute to  chronic hemorrhagic cortical infarct involving the high left frontal lobe".  Noted pt is on low bed/fall precautions in room.   Assessment / Plan / Recommendation Clinical Impression  Pt appears to present w/ Oral phase dysphagia c/b lengthy mastication time w/ MUNCHING behavior; lengthy oral phase time for bolus mastication and manipulation for A-P transfer. Also noted diffuse oral residue post initial swallows as pt continued to use lingual sweeping to swallow/clear. Pt was encouraged to use moist foods/liquid to alternate and clear mouth with. Any oral phase deficits can impact the pharyngeal phase of swallowing increasing risk for choking. Oral phase deficits can be related to Cognitive decline. During trials of thin liquids VIA CUP ONLY, pt consumed ~4+ ozs helping to hold cup w/ no immediate, clinical s/s of aspiration noted; no coughing, no wet vocal quality b/t trials. Respiratory status remained as at baseline during/post trials. OM exam appeared [facility] w/ no unilateral weakness. OF NOTE, pt did not make immediate attempts at self-feeding; support required to steady Cup when drinking. Pt often seemed easily distracted looking around the room but followed through w/ SLP's instructions given verbal cues. Recommend a Dysphagia level 2 (MINCED foods diet w/ gravy for easier oral phase management and intake) and Thin liquids; general aspiration precautions; feeding support at meals. Pills in PUREE for safer swallowing. Recommend further discussion w/ Neurology per MRI results(2019, 2020) for potential formal Cognitive assessment to determine baseline Cognitive status, impact of new event.  SLP Visit Diagnosis: Dysphagia, oral phase (R13.11)  Aspiration Risk  Mild aspiration risk;Risk for inadequate nutrition/hydration(reduced following precautions)    Diet Recommendation  Dysphagia level 2 (MINCED foods w/ gravies to moisten) w/ Thin liquids. General aspiration precautions; assistance w/ feeding at  meals as needed - reduce distractions at meals  Medication Administration: Whole meds with puree(for safer swallowing)    Other  Recommendations Recommended Consults: (Dietician f/u; Neurology f/u) Oral Care Recommendations: Oral care BID;Staff/trained caregiver to provide oral care Other Recommendations: (n/a)   Follow up Recommendations None      Frequency and Duration min 2x/week  1 week       Prognosis Prognosis for Safe Diet Advancement: Fair(-Good) Barriers to Reach Goals: Cognitive deficits;Severity of deficits      Swallow Study   General Date of Onset: 02/27/19 HPI: Pt is a 85 year old male that presented to the ED after being found by neighbor on floor for unknown period of time. Patient does not remember having a fall or when this was. Patient endorses 4 falls in the past 2 weeks, reports his legs are very weak.  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 PMH: HTN, dementia, GERD. At baseline lives at home alone, has a son that checks on him and they go to breakfast a couple times a week. Reports he does his own cooking, cleaning, ADLs; uses rollator in his home and community, does not drive (his son takes him to his appts and to the grocery).  MRI revealed: "Moderate chronic small-vessel ischemic changes of the cerebral hemispheric white matter; Brain atrophy with some frontal and temporal predominance." Newer finding: "Late subacute to chronic hemorrhagic cortical infarct involving the high left frontal lobe".  Noted pt is on low bed/fall precautions in room. Type of Study: Bedside Swallow Evaluation Previous Swallow Assessment: none reported Diet Prior to this Study: Dysphagia 2 (chopped);Nectar-thick liquids(per MD orders ) Temperature Spikes Noted: No(wbc elevated on day of admission; nothing further) Respiratory Status: Room air History of Recent Intubation: No Behavior/Cognition:  Alert;Cooperative;Pleasant mood;Distractible;Requires cueing(unsure of pt's full baseline Cognitive functioning) Oral Cavity Assessment: Within Functional Limits Oral Care Completed by SLP: Yes Oral Cavity - Dentition: Adequate natural dentition Vision: Functional for self-feeding(when holding cup to drink) Self-Feeding Abilities: Needs assist;Needs set up;Total assist(pt did not initiate self-feeding) Patient Positioning: Upright in bed(needed postioning) Baseline Vocal Quality: Normal;Low vocal intensity Volitional Cough: Strong(-Fair) Volitional Swallow: Able to elicit    Oral/Motor/Sensory Function Overall Oral Motor/Sensory Function: Within functional limits   Ice Chips Ice chips: Within functional limits Presentation: Spoon(fed; 2 trials)   Thin Liquid Thin Liquid: Within functional limits Presentation: Cup;Self Fed(supported by SLP; ~4+ ozs total during the meal) Other Comments: followed general aspiration precautions    Nectar Thick Nectar Thick Liquid: Not tested   Honey Thick Honey Thick Liquid: Not tested   Puree Puree: Within functional limits Presentation: Spoon(fed; 5 trials)   Solid     Solid: Impaired Presentation: Spoon(fed; 10+ trials of MINCED foods moistened) Oral Phase Impairments: Impaired mastication Oral Phase Functional Implications: Impaired mastication;Prolonged oral transit;Oral residue(Munching pattern) Pharyngeal Phase Impairments: (none) Other Comments: alternating foods w/ more moist foods or liquids aided in oral clearing       Orinda Kenner, MS, CCC-SLP [place] Luu 03/02/2019,1:16 PM

## 2019-03-02 NOTE — Progress Notes (Signed)
Physical Therapy Treatment Patient Details Name: Billy Bennett. MRN: [id number] DOB: [birth year] Today's Date: 03/02/2019    History of Present Illness Pt is a 85 year old male that presented to the ED after being found by neighbor on floor for unknown period of time. Patient does not remember having a fall or when this was. Patient endorses 4 falls in the past 2 weeks, reports his legs are very weak.  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 PMH: HTN, dementia, GERD. At baseline lives at home alone, has a son that checks on him and they go to breakfast a couple times a week. Reports he does his own cooking, cleaning, ADLs; uses rollator in his home and community, does not drive (his son takes him to his appts and to the grocery).    PT Comments    Pt resting in bed upon PT arrival and agreeable to PT session.  Pt focusing on needing to find his "flashlight" and pack of "briefs" during session; oriented to person and place; inconsistent with following 1 step commands.  Pt able to perform semi-supine to sit with mod to max assist x1.  Sitting balance CGA (with pt leaning R onto R elbow) versus mod to max assist to maintain midline positioning d/t significant R lean today.  Unable to stand on own with max cueing and multiple attempts but pt able to stand with max assist x1 (posterior lean noted initially but improved with cueing/assist; also R lean noted requiring assist to maintain upright--did not improve with cueing).  Able to take steps with walker with 2 assist bed to recliner.  Pillows placed on pt's R side sitting in recliner to promote upright posture.  Pt HOH and with difficulty following commands to assess for any changes in UE/LE strength.   Pt's nurse, MD [place], and NT notified of pt's R lean noted today (R lean not noted on recent PT initial evaluation).  Will continue to focus on strengthening, balance, and  progressive functional mobility during hospitalization.   Follow Up Recommendations  SNF     Equipment Recommendations  Rolling walker with 5" wheels    Recommendations for Other Services OT consult     Precautions / Restrictions Precautions Precautions: Fall Precaution Comments: Aspiration Restrictions Weight Bearing Restrictions: No    Mobility  Bed Mobility Overal bed mobility: Needs Assistance Bed Mobility: Supine to Sit;Rolling Rolling: Mod assist(logrolling L and R in bed with mod vc's and assist for technique (to donn briefs))   Supine to sit: Mod assist;Max assist     General bed mobility comments: assist for trunk and B LE's  Transfers Overall transfer level: Needs assistance Equipment used: Rolling walker (2 wheeled) Transfers: Sit to/from Stand Sit to Stand: Max assist         General transfer comment: pt attempted multiple times to try to stand on his own (from sitting edge of bed) but unable with max cueing for technique (scooting to edge of bed; B LE positioning; B UE positioning) and walker use; able to stand with max assist to initiate and come to full stand up to RW (posterior R lean noted initially with standing)  Ambulation/Gait Ambulation/Gait assistance: Min assist;+2 physical assistance Gait Distance (Feet): 3 Feet(bed to recliner) Assistive device: Rolling walker (2 wheeled) Gait Pattern/deviations: Shuffle Gait velocity: decreased   General Gait Details: shuffling gait; pt with R lean requiring assist to maintain upright/balance; assist for walker use  Stairs             Wheelchair Mobility    Modified Rankin (Stroke Patients Only)       Balance Overall balance assessment: Needs assistance;History of Falls Sitting-balance support: Bilateral upper extremity supported;Feet supported Sitting balance-Leahy Scale: Poor Sitting balance - Comments: pt with R lean in sitting (fluctuating between CGA with pt leaning onto R elbow vs  mod to max assist to maintain midline posture)   Standing balance support: Bilateral upper extremity supported;During functional activity Standing balance-Leahy Scale: Poor Standing balance comment: pt with R posterior lean in standing with B UE support on RW; improved posterior lean with vc's and tactile cues but R lean did not improve                            Cognition Arousal/Alertness: Awake/alert Behavior During Therapy: WFL for tasks assessed/performed                                   General Comments: Oriented to person and place.  Pt asking for therapist to look for his "flashlight" in the room and also pack of "briefs".      Exercises      General Comments   Nursing cleared pt for participation in physical therapy.  Pt agreeable to PT session.      Pertinent Vitals/Pain Pain Assessment: No/denies pain  HR 76-104 bpm during session's activities; O2 sats WFL on room air during session.    Home Living                      Prior Function            PT Goals (current goals can now be found in the care plan section) Acute Rehab PT Goals Patient Stated Goal: Go home PT Goal Formulation: With patient Time For Goal Achievement: 03/14/19 Potential to Achieve Goals: Fair Progress towards PT goals: Not progressing toward goals - comment(R lean noted today requiring increased assist)    Frequency    Min 2X/week      PT Plan Current plan remains appropriate    Co-evaluation              AM-PAC PT "6 Clicks" Mobility   Outcome Measure  Help needed turning from your back to your side while in a flat bed without using bedrails?: A Lot Help needed moving from lying on your back to sitting on the side of a flat bed without using bedrails?: A Lot Help needed moving to and from a bed to a chair (including a wheelchair)?: A Lot Help needed standing up from a chair using your arms (e.g., wheelchair or bedside chair)?: A Lot Help  needed to walk in hospital room?: Total Help needed climbing 3-5 steps with a railing? : Total 6 Click Score: 10    End of Session Equipment Utilized During Treatment: Gait belt Activity Tolerance: Patient tolerated treatment well Patient left: in chair;with call bell/phone within reach;with chair alarm set;Other (comment)(pillows placed on pt's R side to promote midline positioning (reduce R lean)) Nurse Communication: Mobility status;Precautions;Other (comment)(pt's R lean) PT Visit Diagnosis: Unsteadiness on feet (R26.81);Muscle weakness (generalized) (M62.81);Other abnormalities of gait and mobility (R26.89);Repeated falls (R29.6);History of falling (Z91.81);Difficulty in walking, not elsewhere classified (R26.2)     Time: [id number] PT Time Calculation (min) (ACUTE ONLY): 40 min  Charges:  $Therapeutic Activity: 38-52 mins                     Leitha Bleak, PT 03/02/19, 11:30 AM [phone]

## 2019-03-02 NOTE — TOC Initial Note (Signed)
Transition of Care (TOC) - Initial/Assessment Note    Patient Details  Name: Billy Bennett. MRN: [id number] Date of Birth: [birth year]  Transition of Care [facility]) CM/SW Contact:    Ross Ludwig, LCSW Phone Number: 03/02/2019, 5:21 PM  Clinical Narrative:                  Patient is a 85 year old male who is alert and oriented x4.  Patient states he has not been to rehab before, CSW spoke to patient and his nephew who is his HCPOA to discuss SNF options and explain how insurance will pay for the stay.  Patient asked how long he would be at SNF, and CSW informed him it will depend on how he does.  Patient states he is hopeful to get some rehab, then pay for some caregivers to come to his home.  CSW explained the social worker at SNF can help him find out what services are available once he is ready for discharge.  Patient expressed understanding, and did not have any other questions or concerns.  Expected Discharge Plan: Skilled Nursing Facility Barriers to Discharge: Continued Medical Work up   Patient Goals and CMS Choice Patient states their goals for this hospitalization and ongoing recovery are:: To go to SNF for short term rehab, then return back home. CMS Medicare.gov Compare Post Acute Care list provided to:: Patient Represenative (must comment)(Patient's nephew who is HCPOA) Choice offered to / list presented to : [place] / Guardian  Expected Discharge Plan and Services Expected Discharge Plan: [place] In-house Referral: Clinical Social Work   Post Acute Care Choice: [place] Living arrangements for the past 2 months: Single Family Home                 DME Arranged: N/A DME Agency: NA       HH Arranged: NA          Prior Living Arrangements/Services Living arrangements for the past 2 months: Single Family Home Lives with:: Self Patient language and need for interpreter reviewed:: Yes Do you feel safe going back to the place  where you live?: No   Patient feels that he needs to go to SNF for short term rehab, before he is able to return back home.  Need for Family Participation in Patient Care: No (Comment) Care giver support system in place?: No (comment)   Criminal Activity/Legal Involvement Pertinent to Current Situation/Hospitalization: No - Comment as needed  Activities of Daily Living      Permission Sought/Granted Permission sought to share information with : Facility Sport and exercise psychologist, Family Supports Permission granted to share information with : Yes, Verbal Permission Granted  Share Information with NAME: Erine[place], [place]   [id number] HCPOA  Permission granted to share info w AGENCY: SNF admissions        Emotional Assessment Appearance:: Appears younger than stated age Attitude/Demeanor/Rapport: Engaged Affect (typically observed): Accepting, Appropriate, Calm, Stable, Pleasant Orientation: : Oriented to Self, Oriented to Place, Oriented to  Time, Oriented to Situation Alcohol / Substance Use: Not Applicable Psych Involvement: No (comment)  Admission diagnosis:  Encephalopathy [G93.40] Elevated troponin [R77.8] Injury of head, initial encounter [S09.90XA] Fall, initial encounter [W19.XXXA] Patient Active Problem List   Diagnosis Date Noted  . Hemorrhagic stroke ([place])   . Traumatic rhabdomyolysis ([place])   . Gastroesophageal reflux disease without esophagitis   . Head injury   . Acute metabolic encephalopathy [id number]  . Hypokalemia 02/27/2019  .  Elevated troponin 02/27/2019  . Fall   . At high risk for falls 08/28/2017  . Fecal smearing 07/01/2017  . Other constipation 07/01/2017  . Heart murmur, systolic [id number]  . Skin cancer 08/22/2016  . Status post arthroscopy 07/16/2016  . Pure hypercholesterolemia 04/01/2011  . Hypertension 01/20/2011   PCP:  Gayland Curry, MD Pharmacy:   [place], [place]  [address] Phone: [phone] Fax: [phone]     Social Determinants of Health (SDOH) Interventions    Readmission Risk Interventions No flowsheet data found.

## 2019-03-02 NOTE — Progress Notes (Signed)
Patient ID: Billy Baltzer., male   DOB: 1923/05/30, 85 y.o.   MRN: [id number] [facility] PROGRESS NOTE  Billy Newton. Billy Bennett DOB: 08-21-1923 DOA: 02/27/2019 PCP: Gayland Curry, MD  HPI/Subjective: Patient feeling okay.  He was hoping to go home.  Was pretty weak with physical therapy.  Physical therapist noticed he was leaning to the right a little bit today.  Patient eating little bit better today.  Objective: Vitals:   03/02/19 0634 03/02/19 0840  BP: (!) 171/72 (!) 161/67  Pulse: 70 75  Resp: 20   Temp: (!) 97.5 F (36.4 C) 98.4 F (36.9 C)  SpO2: 95% 99%   No intake or output data in the 24 hours ending 03/02/19 1436 Filed Weights   02/27/19 0947 02/28/19 0411 03/02/19 0634  Weight: 72.6 kg 70.8 kg 69.4 kg    ROS: Review of Systems  Constitutional: Negative for chills and fever.  Eyes: Negative for blurred vision.  Respiratory: Negative for cough and shortness of breath.   Cardiovascular: Negative for chest pain.  Gastrointestinal: Negative for abdominal pain, constipation, diarrhea, nausea and vomiting.  Genitourinary: Negative for dysuria.  Musculoskeletal: Negative for joint pain.  Neurological: Negative for dizziness and headaches.   Exam: Physical Exam  HENT:  Nose: No mucosal edema.  Mouth/Throat: No oropharyngeal exudate.  Eyes: Pupils are equal, round, and reactive to light. Conjunctivae and lids are normal.  Neck: Carotid bruit is not present. No thyromegaly present.  Cardiovascular: Regular rhythm, S1 normal and S2 normal.  Murmur heard.  Systolic murmur is present with a grade of 2/6. Respiratory: He has no decreased breath sounds. He has no wheezes. He has no rhonchi. He has no rales.  GI: Soft. Bowel sounds are normal. There is no abdominal tenderness.  Musculoskeletal:     Right ankle: He exhibits no swelling.     Left ankle: He exhibits no swelling.  Neurological: He is alert.  Able to straight leg raise bilaterally.   Skin: Skin is warm. No rash noted. Nails show no clubbing.  Skin tear right leg  Psychiatric: He has a normal mood and affect.      Data Reviewed: Basic Metabolic Panel: Recent Labs  Lab 02/27/19 0953 02/27/19 1324 02/28/19 0302 02/28/19 1505 03/01/19 0504 03/02/19 0557  NA 141  --  141  --  143 141  K 2.9*  --  2.9* 2.9* 3.0* 3.1*  CL 105  --  109  --  111 112*  CO2 27  --  23  --  24 25  GLUCOSE 135*  --  85  --  94 104*  BUN 19  --  18  --  27* 24*  CREATININE 0.72  --  0.81  --  0.85 0.70  CALCIUM 9.0  --  8.5*  --  8.6* 8.5*  MG  --  1.6* 2.0  --  1.9  --   PHOS  --  3.2  --   --   --   --    Liver Function Tests: Recent Labs  Lab 02/27/19 0953  AST 53*  ALT 31  ALKPHOS 81  BILITOT 1.0  PROT 7.1  ALBUMIN 4.1   CBC: Recent Labs  Lab 02/27/19 0953  WBC 15.5*  NEUTROABS 13.8*  HGB 14.1  HCT 41.2  MCV 85.1  PLT 211   Cardiac Enzymes: Recent Labs  Lab 02/27/19 1736 03/01/19 0504  CKTOTAL 897* 393     Recent Results (from the past 240  hour(s))  SARS CORONAVIRUS 2 (TAT 6-24 HRS) Nasopharyngeal Nasopharyngeal Swab     Status: None   Collection Time: 02/27/19 12:51 PM   Specimen: Nasopharyngeal Swab  Result Value Ref Range Status   SARS Coronavirus 2 NEGATIVE NEGATIVE Final    Comment: (NOTE) SARS-CoV-2 target nucleic acids are NOT DETECTED. The SARS-CoV-2 RNA is generally detectable in upper and lower respiratory specimens during the acute phase of infection. Negative results do not preclude SARS-CoV-2 infection, do not rule out co-infections with other pathogens, and should not be used as the sole basis for treatment or other patient management decisions. Negative results must be combined with clinical observations, patient history, and epidemiological information. The expected result is Negative. Fact Sheet for Patients: SugarRoll.be Fact Sheet for Healthcare  Providers: https://www.woods-mathews.com/ This test is not yet approved or cleared by the [place] FDA and  has been authorized for detection and/or diagnosis of SARS-CoV-2 by FDA under an Emergency Use Authorization (EUA). This EUA will remain  in effect (meaning this test can be used) for the duration of the COVID-19 declaration under Section 56 4(b)(1) of the Act, 21 U.S.C. section 360bbb-3(b)(1), unless the authorization is terminated or revoked sooner. Performed at [facility], [place] [address], [place], [place] [address]   Urine culture     Status: Abnormal   Collection Time: 02/27/19  1:24 PM   Specimen: Urine, Random  Result Value Ref Range Status   Specimen Description   Final    URINE, RANDOM Performed at [facility], [address], [place], [place] [address]    Special Requests   Final    NONE Performed at [facility], [address] [place], [place] [address]    Culture MULTIPLE SPECIES PRESENT, SUGGEST RECOLLECTION (A)  Final   Report Status 03/01/2019 FINAL  Final     Scheduled Meds: . amLODipine  5 mg Oral Daily  . cloNIDine  0.1 mg Oral BID  . [START ON 03/03/2019] pantoprazole  40 mg Oral Daily  . potassium chloride  40 mEq Oral BID  . sertraline  25 mg Oral Daily  . sodium chloride flush  3 mL Intravenous Q12H  . tamsulosin  0.4 mg Oral QPC supper   Continuous Infusions: . 0.9 % NaCl with KCl 20 mEq / L 40 mL/hr at 03/02/19 0925    Assessment/Plan:  1. Subacute to chronic hemorrhagic stroke.  Patient presented with acute metabolic encephalopathy.  Limited on treatment options with hemorrhagic stroke. Appreciate neuro consultation.  OT PT and speech therapy consultations.  Patient was able to swallow last night and was placed on dysphagia diet.  Awaiting speech therapy consultation to try to advance his diet further.  Physical therapy recommended rehab.  They chose [facility] and should be able to go out  tomorrow. 2. Mild rhabdomyolysis.  Patient was given IV fluids.  Will discontinue for now.  Hold statin. 3. Hypokalemia replace potassium orally.  And now that patient is eating hopefully should improve. 4. Hypomagnesemia this was replaced into the normal range. 5. Hyperlipidemia hold cholesterol medication 6. GERD on Protonix 7. Essential hypertension.  Started on Norvasc.  Restart low-dose clonidine. 8. BPH on Flomax  Code Status:     Code Status Orders  (From admission, onward)         Start     Ordered   02/28/19 0754  Do not attempt resuscitation (DNR)  Continuous    Question Answer Comment  In the event of cardiac  or respiratory ARREST Do not call a "code blue"   In the event of cardiac or respiratory ARREST Do not perform Intubation, CPR, defibrillation or ACLS   In the event of cardiac or respiratory ARREST Use medication by any route, position, wound care, and other measures to relive pain and suffering. May use oxygen, suction and manual treatment of airway obstruction as needed for comfort.   Comments nurse may pronounce      02/28/19 0753        Code Status History    Date Active Date Inactive Code Status Order ID Comments User Context   02/27/2019 1254 02/28/2019 0753 Full Code [id number]  Para Skeans, MD ED   Advance Care Planning Activity     Family Communication: Spoke with POA Cindie Laroche on the phone Disposition Plan: Rehab with [facility] hopefully for tomorrow  Consultants:  Neurology  Time spent: 27 minutes  [place]

## 2019-03-02 NOTE — Evaluation (Signed)
Occupational Therapy Evaluation Patient Details Name: Billy Bennett. MRN: [id number] DOB: [birth year] Today's Date: 03/02/2019    History of Present Illness Pt is a 85 year old male that presented to the ED after being found by neighbor on floor for unknown period of time. Patient does not remember having a fall or when this was. Patient endorses 4 falls in the past 2 weeks, reports his legs are very weak.  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 PMH: HTN, dementia, GERD. At baseline lives at home alone, has a son that checks on him and they go to breakfast a couple times a week. Reports he does his own cooking, cleaning, ADLs; uses rollator in his home and community, does not drive (his son takes him to his appts and to the grocery).   Clinical Impression   Pt seen for OT evaluation this date. Prior to hospital admission, pt was living by himself and independent with ADL, cleaning, and meal prep. Son checks on him a couple times per week. Pt reports a "nurse friend" assists him with his medication mgt. Currently pt demonstrates impairments in hearing, strength, balance with R lat lean noted, and activity tolerance requiring Mod A for LB ADL and Mod-Max for ADL transfers.  Pt would benefit from skilled OT to address noted impairments and functional limitations (see below for any additional details) in order to maximize safety and independence while minimizing falls risk and caregiver burden.  Upon hospital discharge, recommend pt discharge to SNF.    Follow Up Recommendations  SNF    Equipment Recommendations  3 in 1 bedside commode    Recommendations for Other Services       Precautions / Restrictions Precautions Precautions: Fall Precaution Comments: Aspiration Restrictions Weight Bearing Restrictions: No      Mobility Bed Mobility Overal bed mobility: Needs Assistance Bed Mobility: Supine to  Sit;Rolling;Sit to Supine     Supine to sit: Mod assist;Max assist Sit to supine: Mod assist;Max assist      Transfers Overall transfer level: Needs assistance Equipment used: Rolling walker (2 wheeled) Transfers: Sit to/from Stand Sit to Stand: Max assist              Balance Overall balance assessment: Needs assistance;History of Falls Sitting-balance support: Bilateral upper extremity supported;Feet supported Sitting balance-Leahy Scale: Poor   Postural control: Right lateral lean Standing balance support: Bilateral upper extremity supported;During functional activity Standing balance-Leahy Scale: Poor                             ADL either performed or assessed with clinical judgement   ADL Overall ADL's : Needs assistance/impaired                                       General ADL Comments: Mod A for LB ADL tasks, Mod-Max for ADL transfers     Vision Baseline Vision/History: Wears glasses Wears Glasses: At all times Patient Visual Report: No change from baseline       Perception     Praxis      Pertinent Vitals/Pain Pain Assessment: No/denies pain     Hand Dominance Right   Extremity/Trunk Assessment Upper Extremity Assessment Upper Extremity Assessment: Overall WFL for tasks assessed(WFL for advanced age)   Lower Extremity Assessment Lower Extremity  Assessment: Generalized weakness       Communication Communication Communication: HOH   Cognition Arousal/Alertness: Awake/alert Behavior During Therapy: WFL for tasks assessed/performed Overall Cognitive Status: No family/caregiver present to determine baseline cognitive functioning                                 General Comments: Oriented to self, bday, generally to "hospital"; follows simple commands   General Comments       Exercises     Shoulder Instructions      Home Living Family/patient expects to be discharged to:: Private  residence Living Arrangements: Alone Available Help at Discharge: Family Type of Home: House Home Access: Level entry     [place]: One level     Bathroom Shower/Tub: Occupational psychologist: [place] Accessibility: No   Home Equipment: Environmental consultant - 4 wheels;Cane - single point   Additional Comments: uses rollator at home and in community      Prior Functioning/Environment Level of Independence: Independent with assistive device(s)        Comments: Reports he has had 4 falls in past 2 weeks        OT Problem List: Decreased strength;Decreased cognition;Decreased safety awareness;Decreased knowledge of use of DME or AE;Impaired balance (sitting and/or standing);Decreased activity tolerance      OT Treatment/Interventions: Self-care/ADL training;Therapeutic exercise;Therapeutic activities;Patient/family education;Balance training;DME and/or AE instruction    OT Goals(Current goals can be found in the care plan section) Acute Rehab OT Goals Patient Stated Goal: "go to rehab to get stronger and figure out what to do with my house" OT Goal Formulation: With patient Time For Goal Achievement: 03/16/19 Potential to Achieve Goals: Good ADL Goals Pt Will Perform Lower Body Dressing: with min assist;sit to/from stand;with mod assist Pt Will Transfer to Toilet: with min assist;bedside commode;ambulating(LRAD for amb)  OT Frequency: Min 1X/week   Barriers to D/C: Decreased caregiver support          Co-evaluation              AM-PAC OT "6 Clicks" Daily Activity     Outcome Measure Help from another person eating meals?: None Help from another person taking care of personal grooming?: A Little Help from another person toileting, which includes using toliet, bedpan, or urinal?: A Lot Help from another person bathing (including washing, rinsing, drying)?: A Lot Help from another person to put on and taking off regular upper body clothing?: A  Little Help from another person to put on and taking off regular lower body clothing?: A Lot 6 Click Score: 16   End of Session    Activity Tolerance: Patient tolerated treatment well Patient left: in bed;with call bell/phone within reach;with bed alarm set  OT Visit Diagnosis: Other abnormalities of gait and mobility (R26.89);Repeated falls (R29.6);Muscle weakness (generalized) (M62.81)                Time: [id number] OT Time Calculation (min): 30 min Charges:  OT General Charges $OT Visit: 1 Visit OT Evaluation $OT Eval Moderate Complexity: 1 Mod  Jeni Salles, MPH, MS, OTR/L ascom [phone] 03/02/19, 5:04 PM

## 2019-03-02 NOTE — Care Management Important Message (Signed)
Important Message  Patient Details  Name: Billy Bennett. MRN: [id number] Date of Birth: 02-May-1923   Medicare Important Message Given:  Yes  Initial Medicare IM given by Patient Access Associate on 03/01/2019 at 10:32am.   Dannette Barbara 03/02/2019, 8:23 AM

## 2019-03-02 NOTE — TOC Progression Note (Signed)
Transition of Care (TOC) - Progression Note    Patient Details  Name: Billy Bennett. MRN: [id number] Date of Birth: 1924/01/08  Transition of Care [facility]) CM/SW Contact  Ross Ludwig, [place] Phone Number: 03/02/2019, 5:32 PM  Clinical Narrative:     CSW provided bed offers to patient and his nephew who is the HCPOA.  Patient and nephew chose [facility], CSW Spoke to [facility] and they can accept patient once he is medically ready for discharge and orders have been received.   Expected Discharge Plan: [place] Barriers to Discharge: Continued Medical Work up  Expected Discharge Plan and Services Expected Discharge Plan: [place] In-house Referral: Clinical Social Work   Post Acute Care Choice: [place] Living arrangements for the past 2 months: Single Family Home                 DME Arranged: N/A DME Agency: NA       HH Arranged: NA           Social Determinants of Health (SDOH) Interventions    Readmission Risk Interventions No flowsheet data found.

## 2019-03-03 DIAGNOSIS — T796XXD Traumatic ischemia of muscle, subsequent encounter: Secondary | ICD-10-CM

## 2019-03-03 DIAGNOSIS — E876 Hypokalemia: Secondary | ICD-10-CM

## 2019-03-03 LAB — BASIC METABOLIC PANEL
Anion gap: 9 (ref 5–15)
BUN: 23 mg/dL (ref 8–23)
CO2: 25 mmol/L (ref 22–32)
Calcium: 9.1 mg/dL (ref 8.9–10.3)
Chloride: 110 mmol/L (ref 98–111)
Creatinine, Ser: 0.73 mg/dL (ref 0.61–1.24)
GFR calc Af Amer: >60 mL/min (ref >60)
GFR calc non Af Amer: >60 mL/min (ref >60)
Glucose, Bld: 107 mg/dL — ABNORMAL HIGH (ref 70–99)
Potassium: 4.3 mmol/L (ref 3.5–5.1)
Sodium: 144 mmol/L (ref 135–145)

## 2019-03-03 LAB — MAGNESIUM: Magnesium: 1.7 mg/dL (ref 1.7–2.4)

## 2019-03-03 MED ORDER — PRAVASTATIN SODIUM 20 MG PO TABS: 20.0000 mg | ORAL_TABLET | Freq: Every day | ORAL | Status: DC

## 2019-03-03 MED ORDER — AMLODIPINE BESYLATE 10 MG PO TABS: 10.0000 mg | ORAL_TABLET | Freq: Every day | ORAL | 0 refills | Status: DC

## 2019-03-03 MED ORDER — AMLODIPINE BESYLATE 10 MG PO TABS: 10.0000 mg | ORAL_TABLET | Freq: Every day | ORAL | Status: DC

## 2019-03-03 MED ORDER — HYDRALAZINE HCL 25 MG PO TABS: 25.0000 mg | ORAL_TABLET | Freq: Three times a day (TID) | ORAL | 0 refills | Status: DC

## 2019-03-03 MED ORDER — CLONIDINE HCL 0.1 MG PO TABS
0.1000 mg | ORAL_TABLET | Freq: Every day | ORAL | Status: DC
  Administered 2019-03-03: 0.1 mg via ORAL

## 2019-03-03 MED ORDER — HYDROCHLOROTHIAZIDE 12.5 MG PO CAPS: 12.5000 mg | ORAL_CAPSULE | Freq: Every day | ORAL | Status: DC

## 2019-03-03 MED ORDER — HYDROCHLOROTHIAZIDE 12.5 MG PO CAPS: 12.5000 mg | ORAL_CAPSULE | Freq: Every day | ORAL | 0 refills | Status: DC

## 2019-03-03 MED ORDER — HYDRALAZINE HCL 25 MG PO TABS: 25.0000 mg | ORAL_TABLET | Freq: Three times a day (TID) | ORAL | Status: DC

## 2019-03-03 MED ORDER — DILTIAZEM HCL ER 60 MG PO CP12
120.0000 mg | ORAL_CAPSULE | Freq: Two times a day (BID) | ORAL | Status: DC
  Filled 2019-03-03: qty 2

## 2019-03-03 NOTE — Discharge Summary (Signed)
[place] [facility] [place] NAME: Billy Bennett    MR#:  [id number]  DATE OF BIRTH:  May 03, 1923  DATE OF ADMISSION:  02/27/2019 ADMITTING PHYSICIAN: Para Skeans, MD  DATE OF DISCHARGE: 03/03/2019  PRIMARY CARE PHYSICIAN: Gayland Curry, MD    ADMISSION DIAGNOSIS:  Encephalopathy [G93.40] Elevated troponin [R77.8] Injury of head, initial encounter [S09.90XA] Fall, initial encounter [W19.XXXA]  DISCHARGE DIAGNOSIS:  *acute encephalopathy admission--- resolved suspected due to subacute/chronic hemorrhagic stroke and metabolic encephalopathy *Hypokalemia-- ressolved *Rhabdomyolysis acute, mild resolved after fall *Accelerated hypertension  SECONDARY DIAGNOSIS:   Past Medical History:  Diagnosis Date  . Anxiety   . Arthritis   . Enlarged prostate   . GERD (gastroesophageal reflux disease)   . Heart murmur, systolic 123456  . HOH (hard of hearing)   . Hypercholesteremia   . Hypertension   . Other constipation 07/01/2017  . Pure hypercholesterolemia 04/01/2011  . Skin cancer 08/22/2016   Overview:  Patient sees Dr. Tilden Dome COURSE:  Billy Bennett. is a 85 y.o. male who presents for a fall.  Apparently the patient lives alone and does have a history of dementia.  Was found down next to his bed  When  neighbor came to check on him. Confused and the emergency room.  1. Acute encephalopathy  -patient presented with acute metabolic encephalopathy. He had low potassium and low magnesium and had acute rhabdomyolysis since he was found near his bed after a fall. -Received IV fluids. Potassium and magnesium replaced. -Patient's mentation is back to baseline. He is alert oriented times three.  2. Subacute to chronic hemorrhagic stroke\ -seen by neurology-- no aspirin -cont statins -EEG no abnormality with seizures -PT, OT evaluation noted--- recommends rehab -seen by speech therapy continue dysphagia 2 with thin liquids  patient tolerating well  3. Accelerated hypertension -blood pressure medicines have been changed given low heart rate during nighttime -held Cardizem and started on Norvasc 10 mg daily and hydralazine 25 mg TID -continue clonidine 0.1 mg BID -hydrochlorothiazide decreased dose to 12.5 mg daily -will need close outpatient follow-up with primary care physician  4. Electrolyte abnormality with low potassium low magnesium resolved  5. Acute mild rhabdomyolysis -hold with IV fluids -statins will be resumed  6. Gerd on Protonix  7. BPH on Flomax and finasteride  Family communication: book with POA Billy Bennett on the phone-- for patient to go to [facility]  Discharge disposition: rehab at [facility] today  Code status: DNR   CONSULTS OBTAINED:  Treatment Team:  Leotis Pain, MD  DRUG ALLERGIES:   Allergies  Allergen Reactions  . Ace Inhibitors Cough  . Telmisartan-Hctz Cough    Other Reaction: ARBS - COUGH  . Amoxicillin-Pot Clavulanate Diarrhea    Has patient had a PCN reaction causing immediate rash, facial/tongue/throat swelling, SOB or lightheadedness with hypotension:unsure Has patient had a PCN reaction causing severe rash involving mucus membranes or skin necrosis:unsure Has patient had a PCN reaction that required hospitalization:unsure  Has patient had a PCN reaction occurring within the last 10 years:Yes If all of the above answers are "NO", then may proceed with Cephalosporin use.   . Ibuprofen Other (See Comments)    Ankle swelling ( can take naprosyn)    DISCHARGE MEDICATIONS:   Allergies as of 03/03/2019      Reactions   Ace Inhibitors Cough   Telmisartan-hctz Cough   Other Reaction: ARBS - COUGH   Amoxicillin-pot Clavulanate Diarrhea  Has patient had a PCN reaction causing immediate rash, facial/tongue/throat swelling, SOB or lightheadedness with hypotension:unsure Has patient had a PCN reaction causing severe rash involving mucus  membranes or skin necrosis:unsure Has patient had a PCN reaction that required hospitalization:unsure  Has patient had a PCN reaction occurring within the last 10 years:Yes If all of the above answers are "NO", then may proceed with Cephalosporin use.   Ibuprofen Other (See Comments)   Ankle swelling ( can take naprosyn)      Medication List    STOP taking these medications   aspirin EC 81 MG tablet   cloNIDine 0.1 MG tablet Commonly known as: CATAPRES   diltiazem 240 MG 24 hr capsule Commonly known as: CARDIZEM CD   hydrochlorothiazide 25 MG tablet Commonly known as: HYDRODIURIL Replaced by: hydrochlorothiazide 12.5 MG capsule   naproxen 500 MG tablet Commonly known as: NAPROSYN   Zoster Vaccine Live (PF) 19400 UNT/0.65ML injection Commonly known as: ZOSTAVAX     TAKE these medications   amLODipine 10 MG tablet Commonly known as: NORVASC Take 1 tablet (10 mg total) by mouth daily. Start taking on: March 04, 2019   donepezil 5 MG tablet Commonly known as: ARICEPT Take 5 mg by mouth daily.   finasteride 5 MG tablet Commonly known as: PROSCAR Take 5 mg by mouth daily.   hydrALAZINE 25 MG tablet Commonly known as: APRESOLINE Take 1 tablet (25 mg total) by mouth 3 (three) times daily.   hydrochlorothiazide 12.5 MG capsule Commonly known as: MICROZIDE Take 1 capsule (12.5 mg total) by mouth daily. Replaces: hydrochlorothiazide 25 MG tablet   omeprazole 20 MG capsule Commonly known as: PRILOSEC Take 20 mg by mouth 2 (two) times daily.   pravastatin 20 MG tablet Commonly known as: PRAVACHOL Take 20 mg by mouth at bedtime.   sertraline 25 MG tablet Commonly known as: ZOLOFT Take 25 mg by mouth daily.   tamsulosin 0.4 MG Caps capsule Commonly known as: FLOMAX Take 0.4 mg by mouth.       If you experience worsening of your admission symptoms, develop shortness of breath, life threatening emergency, suicidal or homicidal thoughts you must seek medical  attention immediately by calling 911 or calling your MD immediately  if symptoms less severe.  You Must read complete instructions/literature along with all the possible adverse reactions/side effects for all the Medicines you take and that have been prescribed to you. Take any new Medicines after you have completely understood and accept all the possible adverse reactions/side effects.   Please note  You were cared for by a hospitalist during your hospital stay. If you have any questions about your discharge medications or the care you received while you were in the hospital after you are discharged, you can call the unit and asked to speak with the hospitalist on call if the hospitalist that took care of you is not available. Once you are discharged, your primary care physician will handle any further medical issues. Please note that NO REFILLS for any discharge medications will be authorized once you are discharged, as it is imperative that you return to your primary care physician (or establish a relationship with a primary care physician if you do not have one) for your aftercare needs so that they can reassess your need for medications and monitor your lab values. Today   SUBJECTIVE   Awake alert eating breakfast. No new complaints. Per RN heart rate dropped in the 30s intermittently during nighttime. Patient asymptomatic.  VITAL  SIGNS:  Blood pressure (!) 179/79, pulse 70, temperature 97.6 F (36.4 C), temperature source Oral, resp. rate 17, height 5\' 5"  (1.651 m), weight 70.8 kg, SpO2 97 %.  I/O:    Intake/Output Summary (Last 24 hours) at 03/03/2019 0929 Last data filed at 03/03/2019 0430 Gross per 24 hour  Intake 1827.9 ml  Output -  Net 1827.9 ml    PHYSICAL EXAMINATION:  GENERAL:  85 y.o.-year-old patient lying in the bed with no acute distress.  EYES: Pupils equal, round, reactive to light and accommodation. No scleral icterus. Extraocular muscles intact.  HEENT: Head  atraumatic, normocephalic. Oropharynx and nasopharynx clear.  NECK:  Supple, no jugular venous distention. No thyroid enlargement, no tenderness.  LUNGS: Normal breath sounds bilaterally, no wheezing, rales,rhonchi or crepitation. No use of accessory muscles of respiration.  CARDIOVASCULAR: S1, S2 normal. No murmurs, rubs, or gallops.  ABDOMEN: Soft, non-tender, non-distended. Bowel sounds present. No organomegaly or mass.  EXTREMITIES: No pedal edema, cyanosis, or clubbing.  NEUROLOGIC: Cranial nerves II through XII are intact. Mild right sided weakness. Sensation intact. Gait not checked.  PSYCHIATRIC: The patient is alert and oriented x 3.  SKIN: No obvious rash, lesion, or ulcer.   DATA REVIEW:   CBC  Recent Labs  Lab 02/27/19 0953  WBC 15.5*  HGB 14.1  HCT 41.2  PLT 211    Chemistries  Recent Labs  Lab 02/27/19 0953  03/03/19 0405  NA 141   < > 144  K 2.9*   < > 4.3  CL 105   < > 110  CO2 27   < > 25  GLUCOSE 135*   < > 107*  BUN 19   < > 23  CREATININE 0.72   < > 0.73  CALCIUM 9.0   < > 9.1  MG  --    < > 1.7  AST 53*  --   --   ALT 31  --   --   ALKPHOS 81  --   --   BILITOT 1.0  --   --    < > = values in this interval not displayed.    Microbiology Results   Recent Results (from the past 240 hour(s))  SARS CORONAVIRUS 2 (TAT 6-24 HRS) Nasopharyngeal Nasopharyngeal Swab     Status: None   Collection Time: 02/27/19 12:51 PM   Specimen: Nasopharyngeal Swab  Result Value Ref Range Status   SARS Coronavirus 2 NEGATIVE NEGATIVE Final    Comment: (NOTE) SARS-CoV-2 target nucleic acids are NOT DETECTED. The SARS-CoV-2 RNA is generally detectable in upper and lower respiratory specimens during the acute phase of infection. Negative results do not preclude SARS-CoV-2 infection, do not rule out co-infections with other pathogens, and should not be used as the sole basis for treatment or other patient management decisions. Negative results must be combined with  clinical observations, patient history, and epidemiological information. The expected result is Negative. Fact Sheet for Patients: SugarRoll.be Fact Sheet for Healthcare Providers: https://www.woods-mathews.com/ This test is not yet approved or cleared by the [place] FDA and  has been authorized for detection and/or diagnosis of SARS-CoV-2 by FDA under an Emergency Use Authorization (EUA). This EUA will remain  in effect (meaning this test can be used) for the duration of the COVID-19 declaration under Section 56 4(b)(1) of the Act, 21 U.S.C. section 360bbb-3(b)(1), unless the authorization is terminated or revoked sooner. Performed at [facility], [place] [address], [place], [place] [address]  Urine culture     Status: Abnormal   Collection Time: 02/27/19  1:24 PM   Specimen: Urine, Random  Result Value Ref Range Status   Specimen Description   Final    URINE, RANDOM Performed at [facility], [address] [place], [place] [address]    Special Requests   Final    NONE Performed at [facility], [address] [place], [place] [address]    Culture MULTIPLE SPECIES PRESENT, SUGGEST RECOLLECTION (A)  Final   Report Status 03/01/2019 FINAL  Final    RADIOLOGY:  No results found.   CODE STATUS:     Code Status Orders  (From admission, onward)         Start     Ordered   02/28/19 0754  Do not attempt resuscitation (DNR)  Continuous    Question Answer Comment  In the event of cardiac or respiratory ARREST Do not call a "code blue"   In the event of cardiac or respiratory ARREST Do not perform Intubation, CPR, defibrillation or ACLS   In the event of cardiac or respiratory ARREST Use medication by any route, position, wound care, and other measures to relive pain and suffering. May use oxygen, suction and manual treatment of airway obstruction as needed for comfort.   Comments nurse may pronounce       02/28/19 0753        Code Status History    Date Active Date Inactive Code Status Order ID Comments User Context   02/27/2019 1254 02/28/2019 0753 Full Code [id number]  Para Skeans, MD ED   Advance Care Planning Activity      TOTAL TIME TAKING CARE OF THIS PATIENT: 40 minutes.    Fritzi Mandes M.D on 03/03/2019 at 9:29 AM  Between 7am to 6pm - Pager - [phone] After 6pm go to [email or web address] - password [email or web address]  [facility]    CC: Primary care physician; Gayland Curry, MD

## 2019-03-03 NOTE — Progress Notes (Signed)
Called and gave report to the receiving facility nurse at this time. All questions answered. Billy Bennett M Billy Bennett  Called [facility] and requested non-emergent patient transport to the receiving facility at this time. Awaiting arrival. [name] M [name] 

## 2019-03-03 NOTE — Progress Notes (Signed)
NP Ouma made aware that pt's HR drops to the 30s frequently; nonsustained, pt is asleep and able to respond to voice, no new orders obtained, will continue to monitor.

## 2019-03-03 NOTE — TOC Transition Note (Signed)
Transition of Care [facility]) - CM/SW Discharge Note   Patient Details  Name: Billy Bennett. MRN: [id number] Date of Birth: 05-25-1923  Transition of Care [facility]) CM/SW Contact:  Ross Ludwig, LCSW Phone Number: 03/03/2019, 11:00 AM   Clinical Narrative:     Patient to be d/c'ed today to [facility].  Patient and family agreeable to plans will transport via ems RN to call report to room 505. [phone].  CSW left message with patient's nephew who is the [facility] and physician has spoken to him as well.    Final next level of care: Skilled Nursing Facility Barriers to Discharge: Barriers Resolved   Patient Goals and CMS Choice Patient states their goals for this hospitalization and ongoing recovery are:: To go to SNF for short term rehab, then return back home with home health. CMS Medicare.gov Compare Post Acute Care list provided to:: Patient Represenative (must comment) Choice offered to / list presented to : [facility] POA / Guardian, Patient  Discharge Placement PASRR number recieved: 03/01/19            Patient [facility] bed at: [facility] Patient to be transferred to facility by: [facility] Name of family member notified: Dacorian, [place] Other   [id number] Patient and family notified of of transfer: 03/03/19  Discharge Plan and Services In-house Referral: Clinical Social Work   Post Acute Care Choice: [place]          DME Arranged: N/A DME Agency: NA       HH Arranged: NA HH Agency: NA        Social Determinants of Health (SDOH) Interventions     Readmission Risk Interventions No flowsheet data found.

## 2019-03-03 NOTE — Discharge Instructions (Signed)
Pt to f/u with PCP regarding BP check and adjustment of meds

## 2019-03-09 LAB — LYME DISEASE, WESTERN BLOT
IgG P18 Ab.: ABSENT
IgG P28 Ab.: ABSENT
IgG P30 Ab.: ABSENT
IgG P39 Ab.: ABSENT
IgG P41 Ab.: ABSENT
IgG P45 Ab.: ABSENT
IgG P58 Ab.: ABSENT
IgG P66 Ab.: ABSENT
IgG P93 Ab.: ABSENT
IgM P23 Ab.: ABSENT
IgM P41 Ab.: ABSENT
Lyme IgG Wb: NEGATIVE
Lyme IgM Wb: NEGATIVE

## 2019-10-03 ENCOUNTER — Emergency Department: Payer: Medicare Other

## 2019-10-03 ENCOUNTER — Encounter: Payer: Self-pay | Admitting: Emergency Medicine

## 2019-10-03 ENCOUNTER — Emergency Department
Admission: EM | Admit: 2019-10-03 | Discharge: 2019-10-03 | Disposition: A | Discharge status: Home / Self Care | Payer: Medicare Other | Attending: Emergency Medicine | Admitting: Emergency Medicine

## 2019-10-03 DIAGNOSIS — Z85828 Personal history of other malignant neoplasm of skin: Secondary | ICD-10-CM | POA: Diagnosis not present

## 2019-10-03 DIAGNOSIS — Z8673 Personal history of transient ischemic attack (TIA), and cerebral infarction without residual deficits: Secondary | ICD-10-CM | POA: Diagnosis not present

## 2019-10-03 DIAGNOSIS — Y9301 Activity, walking, marching and hiking: Secondary | ICD-10-CM | POA: Diagnosis not present

## 2019-10-03 DIAGNOSIS — W010XXA Fall on same level from slipping, tripping and stumbling without subsequent striking against object, initial encounter: Secondary | ICD-10-CM | POA: Insufficient documentation

## 2019-10-03 DIAGNOSIS — I10 Essential (primary) hypertension: Secondary | ICD-10-CM | POA: Insufficient documentation

## 2019-10-03 DIAGNOSIS — Z79899 Other long term (current) drug therapy: Secondary | ICD-10-CM | POA: Diagnosis not present

## 2019-10-03 DIAGNOSIS — Z87891 Personal history of nicotine dependence: Secondary | ICD-10-CM | POA: Diagnosis not present

## 2019-10-03 DIAGNOSIS — S51011A Laceration without foreign body of right elbow, initial encounter: Secondary | ICD-10-CM

## 2019-10-03 DIAGNOSIS — Y999 Unspecified external cause status: Secondary | ICD-10-CM | POA: Diagnosis not present

## 2019-10-03 DIAGNOSIS — W19XXXA Unspecified fall, initial encounter: Secondary | ICD-10-CM

## 2019-10-03 DIAGNOSIS — Y92129 Unspecified place in nursing home as the place of occurrence of the external cause: Secondary | ICD-10-CM | POA: Diagnosis not present

## 2019-10-03 DIAGNOSIS — S0990XA Unspecified injury of head, initial encounter: Secondary | ICD-10-CM | POA: Diagnosis present

## 2019-10-03 LAB — CBC WITH DIFFERENTIAL/PLATELET
Abs Immature Granulocytes: 0.05 10*3/uL (ref 0.00–0.07)
Basophils Absolute: 0.1 10*3/uL (ref 0.0–0.1)
Basophils Relative: 1 %
Eosinophils Absolute: 0.2 10*3/uL (ref 0.0–0.5)
Eosinophils Relative: 2 %
HCT: 34.6 % — ABNORMAL LOW (ref 39.0–52.0)
Hemoglobin: 11.6 g/dL — ABNORMAL LOW (ref 13.0–17.0)
Immature Granulocytes: 1 %
Lymphocytes Relative: 12 %
Lymphs Abs: 1.1 10*3/uL (ref 0.7–4.0)
MCH: 28.9 pg (ref 26.0–34.0)
MCHC: 33.5 g/dL (ref 30.0–36.0)
MCV: 86.1 fL (ref 80.0–100.0)
Monocytes Absolute: 0.8 10*3/uL (ref 0.1–1.0)
Monocytes Relative: 9 %
Neutro Abs: 6.9 10*3/uL (ref 1.7–7.7)
Neutrophils Relative %: 75 %
Platelets: 224 10*3/uL (ref 150–400)
RBC: 4.02 MIL/uL — ABNORMAL LOW (ref 4.22–5.81)
RDW: 16 % — ABNORMAL HIGH (ref 11.5–15.5)
WBC: 9 10*3/uL (ref 4.0–10.5)
nRBC: 0 % (ref 0.0–0.2)

## 2019-10-03 LAB — BASIC METABOLIC PANEL
Anion gap: 8 (ref 5–15)
BUN: 27 mg/dL — ABNORMAL HIGH (ref 8–23)
CO2: 26 mmol/L (ref 22–32)
Calcium: 8.8 mg/dL — ABNORMAL LOW (ref 8.9–10.3)
Chloride: 104 mmol/L (ref 98–111)
Creatinine, Ser: 0.96 mg/dL (ref 0.61–1.24)
GFR calc Af Amer: >60 mL/min (ref >60)
GFR calc non Af Amer: >60 mL/min (ref >60)
Glucose, Bld: 100 mg/dL — ABNORMAL HIGH (ref 70–99)
Potassium: 3.4 mmol/L — ABNORMAL LOW (ref 3.5–5.1)
Sodium: 138 mmol/L (ref 135–145)

## 2019-10-03 NOTE — ED Provider Notes (Signed)
[facility] Emergency Department Provider Note ____________________________________________  Time seen: 0945  I have reviewed the triage vital signs and the nursing notes.  HISTORY  Chief Complaint  Fall   HPI Billy Bennett. is a 86 y.o. male presents to the ER today via EMS with complaint of fall. Patient reports he is a resident of [facility]. He reports he fell backwards, striking his head on the concrete. He denies headache, dizziness, visual changes or neck pain at this time. He did sustain a large skin tear to his right elbow, this was dressed by EMS. His last tetanus was in 2017.  Past Medical History:  Diagnosis Date  . Anxiety   . Arthritis   . Enlarged prostate   . GERD (gastroesophageal reflux disease)   . Heart murmur, systolic 93/57/0177  . HOH (hard of hearing)   . Hypercholesteremia   . Hypertension   . Other constipation 07/01/2017  . Pure hypercholesterolemia 04/01/2011  . Skin cancer 08/22/2016   Overview:  Patient sees Dr. Phillip Heal    Patient Active Problem List   Diagnosis Date Noted  . Hemorrhagic stroke ([place])   . Traumatic rhabdomyolysis ([place])   . Gastroesophageal reflux disease without esophagitis   . Head injury   . Acute metabolic encephalopathy 93/90/3009  . Hypokalemia 02/27/2019  . Elevated troponin 02/27/2019  . Fall   . At high risk for falls 08/28/2017  . Fecal smearing 07/01/2017  . Other constipation 07/01/2017  . Heart murmur, systolic 23/30/0762  . Skin cancer 08/22/2016  . Status post arthroscopy 07/16/2016  . Pure hypercholesterolemia 04/01/2011  . Hypertension 01/20/2011    Past Surgical History:  Procedure Laterality Date  . ANAL FISSURE REPAIR    . CHOLECYSTECTOMY    . EYE SURGERY Bilateral    Cataract Extraction with IOL  . KNEE ARTHROSCOPY Right 07/08/2016   Procedure: ARTHROSCOPY KNEE, PARTIAL MEDIAL MENISECTOMY, CHONDROPLASTY;  Surgeon: Dereck Leep, MD;  Location: [facility] ORS;  Service:  Orthopedics;  Laterality: Right;    Prior to Admission medications   Medication Sig Start Date End Date Taking? Authorizing Provider  amLODipine (NORVASC) 10 MG tablet Take 1 tablet (10 mg total) by mouth daily. 03/04/19   Fritzi Mandes, MD  donepezil (ARICEPT) 5 MG tablet Take 5 mg by mouth daily. 01/25/19   [provider]  finasteride (PROSCAR) 5 MG tablet Take 5 mg by mouth daily.    [provider]  hydrALAZINE (APRESOLINE) 25 MG tablet Take 1 tablet (25 mg total) by mouth 3 (three) times daily. 03/03/19   Fritzi Mandes, MD  hydrochlorothiazide (MICROZIDE) 12.5 MG capsule Take 1 capsule (12.5 mg total) by mouth daily. 03/03/19   Fritzi Mandes, MD  omeprazole (PRILOSEC) 20 MG capsule Take 20 mg by mouth 2 (two) times daily. 10/02/18 10/02/19  [provider]  pravastatin (PRAVACHOL) 20 MG tablet Take 20 mg by mouth at bedtime.  09/30/15   [provider]  sertraline (ZOLOFT) 25 MG tablet Take 25 mg by mouth daily. 01/25/19   [provider]  tamsulosin (FLOMAX) 0.4 MG CAPS capsule Take 0.4 mg by mouth.    [provider]    Allergies Ace inhibitors, Telmisartan-hctz, Amoxicillin-pot clavulanate, and Ibuprofen  Family History  Problem Relation Age of Onset  . Alzheimer's disease Father   . Prostate cancer Brother     Social History Social History   Tobacco Use  . Smoking status: Former Smoker    Types: Cigarettes  Quit date: 04/14/1977    Years since quitting: 42.4  . Smokeless tobacco: Never Used  Vaping Use  . Vaping Use: Never used  Substance Use Topics  . Alcohol use: No  . Drug use: No    Review of Systems  Constitutional: Negative for fever, chills or body aches. Eyes: Negative for visual changes. Cardiovascular: Negative for chest pain or chest tightness. Respiratory: Negative for cough or shortness of breath. Musculoskeletal: Negative for neck, back or elbow pain. Skin: Positive for skin tear to right  elbow. Neurological: Negative for headaches, dizziness, focal weakness, tingling or numbness. ____________________________________________  PHYSICAL EXAM:  VITAL SIGNS: ED Triage Vitals  Enc Vitals Group     BP 10/03/19 0935 (!) 160/69     Pulse Rate 10/03/19 0935 66     Resp 10/03/19 0935 18     Temp 10/03/19 0935 (!) 97.5 F (36.4 C)     Temp Source 10/03/19 0935 Oral     SpO2 10/03/19 0935 97 %     Weight 10/03/19 0937 148 lb 2.4 oz (67.2 kg)     Height 10/03/19 0937 5\' 6"  (1.676 m)     Head Circumference --      Peak Flow --      Pain Score --      Pain Loc --      Pain Edu? --      Excl. in [place]? --     Constitutional: Alert and oriented to person and place. History of dementia Head: Normocephalic and atraumatic. Eyes: Conjunctivae are normal. PERRL. Normal extraocular movements Cardiovascular: Normal rate, regular rhythm. Radial pulses 2+ bilaterally. Respiratory: Normal respiratory effort. No wheezes/rales/rhonchi. Musculoskeletal: Decreased flexion, extension and rotation of the cervical spine, likely age-related. No bony tenderness noted with palpation of the cervical spine. Shoulder shrug is equal. Normal flexion, extension and rotation of the right elbow. No pain with palpation over the right elbow. Handgrips equal. Neurologic: Mumbled speech, baseline. Skin: Skin tear noted to right elbow. ____________________________________________   LABS  Labs Reviewed  BASIC METABOLIC PANEL - Abnormal; Notable for the following components:      Result Value   Potassium 3.4 (*)    Glucose, Bld 100 (*)    BUN 27 (*)    Calcium 8.8 (*)    All other components within normal limits  CBC WITH DIFFERENTIAL/PLATELET - Abnormal; Notable for the following components:   RBC 4.02 (*)    Hemoglobin 11.6 (*)    HCT 34.6 (*)    RDW 16.0 (*)    All other components within normal limits     ____________________________________________    RADIOLOGY  Imaging Orders     CT Head  Wo Contrast IMPRESSION:  1. No acute findings.    2. 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    3. Stable chronic sinus inflammatory disease.    ____________________________________________    INITIAL IMPRESSION / ASSESSMENT AND PLAN / ED COURSE  Fall, Skin Tear Right Elbow:  CT head negative Labs unremarkable Skin tear cleanses and steri stripped by this provider CBC, BMET unremarkable Will return to [facility] ____________________________________________  FINAL CLINICAL IMPRESSION(S) / ED DIAGNOSES  Final diagnoses:  Fall, initial encounter  Skin tear of right elbow without complication, initial encounter      Jearld Fenton, NP 10/03/19 1209    Earleen Newport, MD 10/03/19 1416

## 2019-10-03 NOTE — ED Triage Notes (Signed)
Pt to ED by EMS from [facility] after falling. Pt states he remembers falling and says he hit his head. Pt also has large skin tear to right elbow. Pt A&O x4.

## 2019-10-03 NOTE — Discharge Instructions (Addendum)
You were seen today after a fall.  Your head CT was negative for acute findings.  We cleansed the skin tear to your right elbow and placed Steri-Strips over this.  Please leave in place until healed.

## 2019-10-03 NOTE — ED Notes (Addendum)
Pt changed and peri care provided. Provided warm blankets

## 2019-10-03 NOTE — ED Notes (Signed)
Called [facility] for transport to [facility]

## 2019-10-18 ENCOUNTER — Emergency Department: Payer: Medicare Other

## 2019-10-18 ENCOUNTER — Other Ambulatory Visit: Payer: Self-pay

## 2019-10-18 ENCOUNTER — Inpatient Hospital Stay
Admission: EM | Admit: 2019-10-18 | Discharge: 2019-10-21 | DRG: 690 | Payer: Medicare Other | Attending: Hospitalist | Admitting: Hospitalist

## 2019-10-18 DIAGNOSIS — Z85828 Personal history of other malignant neoplasm of skin: Secondary | ICD-10-CM

## 2019-10-18 DIAGNOSIS — N3090 Cystitis, unspecified without hematuria: Principal | ICD-10-CM | POA: Diagnosis present

## 2019-10-18 DIAGNOSIS — Z87891 Personal history of nicotine dependence: Secondary | ICD-10-CM

## 2019-10-18 DIAGNOSIS — Z7983 Long term (current) use of bisphosphonates: Secondary | ICD-10-CM

## 2019-10-18 DIAGNOSIS — E860 Dehydration: Secondary | ICD-10-CM | POA: Diagnosis present

## 2019-10-18 DIAGNOSIS — N390 Urinary tract infection, site not specified: Secondary | ICD-10-CM | POA: Diagnosis present

## 2019-10-18 DIAGNOSIS — Z79899 Other long term (current) drug therapy: Secondary | ICD-10-CM

## 2019-10-18 DIAGNOSIS — Z9049 Acquired absence of other specified parts of digestive tract: Secondary | ICD-10-CM

## 2019-10-18 DIAGNOSIS — R112 Nausea with vomiting, unspecified: Secondary | ICD-10-CM

## 2019-10-18 DIAGNOSIS — A084 Viral intestinal infection, unspecified: Secondary | ICD-10-CM | POA: Diagnosis present

## 2019-10-18 DIAGNOSIS — Z820 Family history of epilepsy and other diseases of the nervous system: Secondary | ICD-10-CM

## 2019-10-18 DIAGNOSIS — N179 Acute kidney failure, unspecified: Secondary | ICD-10-CM | POA: Diagnosis present

## 2019-10-18 DIAGNOSIS — E785 Hyperlipidemia, unspecified: Secondary | ICD-10-CM | POA: Diagnosis present

## 2019-10-18 DIAGNOSIS — I10 Essential (primary) hypertension: Secondary | ICD-10-CM | POA: Diagnosis present

## 2019-10-18 DIAGNOSIS — F329 Major depressive disorder, single episode, unspecified: Secondary | ICD-10-CM | POA: Diagnosis present

## 2019-10-18 DIAGNOSIS — Z66 Do not resuscitate: Secondary | ICD-10-CM | POA: Diagnosis present

## 2019-10-18 DIAGNOSIS — H9190 Unspecified hearing loss, unspecified ear: Secondary | ICD-10-CM | POA: Diagnosis present

## 2019-10-18 DIAGNOSIS — Z8042 Family history of malignant neoplasm of prostate: Secondary | ICD-10-CM

## 2019-10-18 DIAGNOSIS — K219 Gastro-esophageal reflux disease without esophagitis: Secondary | ICD-10-CM | POA: Diagnosis present

## 2019-10-18 DIAGNOSIS — Z20822 Contact with and (suspected) exposure to covid-19: Secondary | ICD-10-CM | POA: Diagnosis present

## 2019-10-18 DIAGNOSIS — N400 Enlarged prostate without lower urinary tract symptoms: Secondary | ICD-10-CM | POA: Diagnosis present

## 2019-10-18 LAB — CBC WITH DIFFERENTIAL/PLATELET
Abs Immature Granulocytes: 0.06 10*3/uL (ref 0.00–0.07)
Basophils Absolute: 0 10*3/uL (ref 0.0–0.1)
Basophils Relative: 0 %
Eosinophils Absolute: 0.1 10*3/uL (ref 0.0–0.5)
Eosinophils Relative: 1 %
HCT: 35.8 % — ABNORMAL LOW (ref 39.0–52.0)
Hemoglobin: 12.1 g/dL — ABNORMAL LOW (ref 13.0–17.0)
Immature Granulocytes: 0 %
Lymphocytes Relative: 4 %
Lymphs Abs: 0.6 10*3/uL — ABNORMAL LOW (ref 0.7–4.0)
MCH: 29.2 pg (ref 26.0–34.0)
MCHC: 33.8 g/dL (ref 30.0–36.0)
MCV: 86.3 fL (ref 80.0–100.0)
Monocytes Absolute: 1.1 10*3/uL — ABNORMAL HIGH (ref 0.1–1.0)
Monocytes Relative: 8 %
Neutro Abs: 12.4 10*3/uL — ABNORMAL HIGH (ref 1.7–7.7)
Neutrophils Relative %: 87 %
Platelets: 261 10*3/uL (ref 150–400)
RBC: 4.15 MIL/uL — ABNORMAL LOW (ref 4.22–5.81)
RDW: 16 % — ABNORMAL HIGH (ref 11.5–15.5)
WBC: 14.2 10*3/uL — ABNORMAL HIGH (ref 4.0–10.5)
nRBC: 0 % (ref 0.0–0.2)

## 2019-10-18 MED ORDER — ONDANSETRON HCL 4 MG/2ML IJ SOLN
4.0000 mg | Freq: Once | INTRAMUSCULAR | Status: AC
  Administered 2019-10-18: 4 mg via INTRAVENOUS
  Filled 2019-10-18: qty 2

## 2019-10-18 MED ORDER — SODIUM CHLORIDE 0.9 % IV BOLUS
500.0000 mL | Freq: Once | INTRAVENOUS | Status: AC
  Administered 2019-10-18: 500 mL via INTRAVENOUS

## 2019-10-18 NOTE — ED Provider Notes (Addendum)
[facility] Emergency Department Provider Note    First MD Initiated Contact with Patient 10/18/19 2256     (approximate)  I have reviewed the triage vital signs and the nursing notes.   HISTORY  Chief Complaint Emesis    HPI Wilson Dusenbery. is a 86 y.o. male with the below listed past medical history presents to the ER for nausea and several episodes of nonbloody nonbilious emesis this evening occurred around 10:00 after the patient ate dinner.  Denies any fevers.  No cough or congestion.  Denies any diarrhea.   He denies any pain at this moment.   Past Medical History:  Diagnosis Date  . Anxiety   . Arthritis   . Enlarged prostate   . GERD (gastroesophageal reflux disease)   . Heart murmur, systolic 62/70/3500  . HOH (hard of hearing)   . Hypercholesteremia   . Hypertension   . Other constipation 07/01/2017  . Pure hypercholesterolemia 04/01/2011  . Skin cancer 08/22/2016   Overview:  Patient sees Dr. Phillip Heal   Family History  Problem Relation Age of Onset  . Alzheimer's disease Father   . Prostate cancer Brother    Past Surgical History:  Procedure Laterality Date  . ANAL FISSURE REPAIR    . CHOLECYSTECTOMY    . EYE SURGERY Bilateral    Cataract Extraction with IOL  . KNEE ARTHROSCOPY Right 07/08/2016   Procedure: ARTHROSCOPY KNEE, PARTIAL MEDIAL MENISECTOMY, CHONDROPLASTY;  Surgeon: Dereck Leep, MD;  Location: [facility] ORS;  Service: Orthopedics;  Laterality: Right;   Patient Active Problem List   Diagnosis Date Noted  . UTI (urinary tract infection) 10/19/2019  . Hemorrhagic stroke ([place])   . Traumatic rhabdomyolysis ([place])   . Gastroesophageal reflux disease without esophagitis   . Head injury   . Acute metabolic encephalopathy 93/81/8299  . Hypokalemia 02/27/2019  . Elevated troponin 02/27/2019  . Fall   . At high risk for falls 08/28/2017  . Fecal smearing 07/01/2017  . Other constipation 07/01/2017  . Heart murmur,  systolic 37/16/9678  . Skin cancer 08/22/2016  . Status post arthroscopy 07/16/2016  . Pure hypercholesterolemia 04/01/2011  . Hypertension 01/20/2011      Prior to Admission medications   Medication Sig Start Date End Date Taking? Authorizing Provider  amLODipine (NORVASC) 10 MG tablet Take 1 tablet (10 mg total) by mouth daily. 03/04/19   Fritzi Mandes, MD  donepezil (ARICEPT) 5 MG tablet Take 5 mg by mouth daily. 01/25/19   [provider]  finasteride (PROSCAR) 5 MG tablet Take 5 mg by mouth daily.    [provider]  hydrALAZINE (APRESOLINE) 25 MG tablet Take 1 tablet (25 mg total) by mouth 3 (three) times daily. 03/03/19   Fritzi Mandes, MD  hydrochlorothiazide (MICROZIDE) 12.5 MG capsule Take 1 capsule (12.5 mg total) by mouth daily. 03/03/19   Fritzi Mandes, MD  omeprazole (PRILOSEC) 20 MG capsule Take 20 mg by mouth 2 (two) times daily. 10/02/18 10/02/19  [provider]  pravastatin (PRAVACHOL) 20 MG tablet Take 20 mg by mouth at bedtime.  09/30/15   [provider]  sertraline (ZOLOFT) 25 MG tablet Take 25 mg by mouth daily. 01/25/19   [provider]  tamsulosin (FLOMAX) 0.4 MG CAPS capsule Take 0.4 mg by mouth.    [provider]    Allergies Ace inhibitors, Telmisartan-hctz, Amoxicillin-pot clavulanate, and Ibuprofen    Social History Social History   Tobacco Use  . Smoking status: Former  Smoker    Types: Cigarettes    Quit date: 04/14/1977    Years since quitting: 42.5  . Smokeless tobacco: Never Used  Vaping Use  . Vaping Use: Never used  Substance Use Topics  . Alcohol use: No  . Drug use: No    Review of Systems Patient denies headaches, rhinorrhea, blurry vision, numbness, shortness of breath, chest pain, edema, cough, abdominal pain, nausea, vomiting, diarrhea, dysuria, fevers, rashes or hallucinations unless otherwise stated above in HPI. ____________________________________________   PHYSICAL  EXAM:  VITAL SIGNS: Vitals:   10/19/19 0116 10/19/19 0130  BP: (!) 154/95 137/62  Pulse: 92 88  Resp: 18 19  Temp:    SpO2: 95% 95%    Constitutional: Alert , holding emesis bag but not actively vomiting Eyes: Conjunctivae are normal.  Head: Atraumatic. Nose: No congestion/rhinnorhea. Mouth/Throat: Mucous membranes are moist.   Neck: No stridor. Painless ROM.  Cardiovascular: Normal rate, regular rhythm. G+ holosystolic murmur Respiratory: Normal respiratory effort.  No retractions. Lungs CTAB. Gastrointestinal: Soft and nontender. No distention. No abdominal bruits. No CVA tenderness. Genitourinary:  Musculoskeletal: No lower extremity tenderness nor edema.  No joint effusions. Neurologic:  Normal speech and language. No gross focal neurologic deficits are appreciated. No facial droop Skin:  Skin is warm, dry and intact. No rash noted. Psychiatric: Mood and affect are normal. Speech and behavior are normal.  ____________________________________________   LABS (all labs ordered are listed, but only abnormal results are displayed)  Results for orders placed or performed during the hospital encounter of 10/18/19 (from the past 24 hour(s))  CBC with Differential/Platelet     Status: Abnormal   Collection Time: 10/18/19 11:03 PM  Result Value Ref Range   WBC 14.2 (H) 4.0 - 10.5 K/uL   RBC 4.15 (L) 4.22 - 5.81 MIL/uL   Hemoglobin 12.1 (L) 13.0 - 17.0 g/dL   HCT 35.8 (L) 39 - 52 %   MCV 86.3 80.0 - 100.0 fL   MCH 29.2 26.0 - 34.0 pg   MCHC 33.8 30.0 - 36.0 g/dL   RDW 16.0 (H) 11.5 - 15.5 %   Platelets 261 150 - 400 K/uL   nRBC 0.0 0.0 - 0.2 %   Neutrophils Relative % 87 %   Neutro Abs 12.4 (H) 1.7 - 7.7 K/uL   Lymphocytes Relative 4 %   Lymphs Abs 0.6 (L) 0.7 - 4.0 K/uL   Monocytes Relative 8 %   Monocytes Absolute 1.1 (H) 0 - 1 K/uL   Eosinophils Relative 1 %   Eosinophils Absolute 0.1 0 - 0 K/uL   Basophils Relative 0 %   Basophils Absolute 0.0 0 - 0 K/uL    Immature Granulocytes 0 %   Abs Immature Granulocytes 0.06 0.00 - 0.07 K/uL  Comprehensive metabolic panel     Status: Abnormal   Collection Time: 10/18/19 11:03 PM  Result Value Ref Range   Sodium 140 135 - 145 mmol/L   Potassium 3.7 3.5 - 5.1 mmol/L   Chloride 104 98 - 111 mmol/L   CO2 24 22 - 32 mmol/L   Glucose, Bld 125 (H) 70 - 99 mg/dL   BUN 32 (H) 8 - 23 mg/dL   Creatinine, Ser 1.39 (H) 0.61 - 1.24 mg/dL   Calcium 8.9 8.9 - 10.3 mg/dL   Total Protein 7.3 6.5 - 8.1 g/dL   Albumin 3.8 3.5 - 5.0 g/dL   AST 20 15 - 41 U/L   ALT 14 0 - 44 U/L  Alkaline Phosphatase 72 38 - 126 U/L   Total Bilirubin 0.6 0.3 - 1.2 mg/dL   GFR calc non Af Amer 43 (L) >60 mL/min   GFR calc Af Amer 50 (L) >60 mL/min   Anion gap 12 5 - 15  Lipase, blood     Status: None   Collection Time: 10/18/19 11:03 PM  Result Value Ref Range   Lipase 28 11 - 51 U/L  Lactic acid, plasma     Status: None   Collection Time: 10/18/19 11:46 PM  Result Value Ref Range   Lactic Acid, Venous 0.8 0.5 - 1.9 mmol/L  Troponin I (High Sensitivity)     Status: Abnormal   Collection Time: 10/18/19 11:49 PM  Result Value Ref Range   Troponin I (High Sensitivity) 19 (H) <18 ng/L  Urinalysis, Complete w Microscopic     Status: Abnormal   Collection Time: 10/19/19  1:46 AM  Result Value Ref Range   Color, Urine YELLOW (A) YELLOW   APPearance CLOUDY (A) CLEAR   Specific Gravity, Urine 1.026 1.005 - 1.030   pH 6.0 5.0 - 8.0   Glucose, UA NEGATIVE NEGATIVE mg/dL   Hgb urine dipstick NEGATIVE NEGATIVE   Bilirubin Urine NEGATIVE NEGATIVE   Ketones, ur NEGATIVE NEGATIVE mg/dL   Protein, ur 100 (A) NEGATIVE mg/dL   Nitrite NEGATIVE NEGATIVE   Leukocytes,Ua LARGE (A) NEGATIVE   RBC / HPF 0-5 0 - 5 RBC/hpf   WBC, UA >50 (H) 0 - 5 WBC/hpf   Bacteria, UA RARE (A) NONE SEEN   Squamous Epithelial / LPF 0-5 0 - 5   WBC Clumps PRESENT    Mucus PRESENT   Troponin I (High Sensitivity)     Status: Abnormal   Collection Time:  10/19/19  1:46 AM  Result Value Ref Range   Troponin I (High Sensitivity) 21 (H) <18 ng/L   ____________________________________________  EKG My review and personal interpretation at Time: 22:46   Indication: nausea  Rate: 95  Rhythm: sinus Axis: normal Other: normal intervals, no stemi ____________________________________________  RADIOLOGY  I personally reviewed all radiographic images ordered to evaluate for the above acute complaints and reviewed radiology reports and findings.  These findings were personally discussed with the patient.  Please see medical record for radiology report.  ____________________________________________   PROCEDURES  Procedure(s) performed:  Procedures    Critical Care performed: no ____________________________________________   INITIAL IMPRESSION / ASSESSMENT AND PLAN / ED COURSE  Pertinent labs & imaging results that were available during my care of the patient were reviewed by me and considered in my medical decision making (see chart for details).   DDX: sbo, gastritis, gastric outlet obstruction, colitis, diverticulitis, pneumonia, ACS, sepsis, mesenteric ischemia, pancreatitis, biliary pathology  Morgan Rennert. is a 86 y.o. who presents to the ED with symptoms as described above.  Patient is frail appearing though nontoxic.  Blood work does show evidence of mild AKI monocytosis as well.  Given his age and risk with CT imaging of the head and abdomen were ordered.  Does not have any evidence of pneumonia.  No fever.  Lactate normal.  Troponin is improved as compared to previous.  CT imaging without any evidence of acute intra-abdominal process and specifically no obstruction.  LFTs appear okay.  Lipase normal.  Does have evidence of acute cystitis.  Will order IV antibiotics and given his nausea vomiting, leukocytosis, AKI at age 69 and frail appearing will discuss with hospitalist for observation for IV  fluids and antibiotics.  Have  ordered urine culture.     The patient was evaluated in Emergency Department today for the symptoms described in the history of present illness. He/she was evaluated in the context of the global COVID-19 pandemic, which necessitated consideration that the patient might be at risk for infection with the SARS-CoV-2 virus that causes COVID-19. Institutional protocols and algorithms that pertain to the evaluation of patients at risk for COVID-19 are in a state of rapid change based on information released by regulatory bodies including the CDC and federal and state organizations. These policies and algorithms were followed during the patient's care in the ED.  As part of my medical decision making, I reviewed the following data within the [place] notes reviewed and incorporated, Labs reviewed, notes from prior ED visits and [place] Controlled Substance Database   ____________________________________________   FINAL CLINICAL IMPRESSION(S) / ED DIAGNOSES  Final diagnoses:  Nausea and vomiting, intractability of vomiting not specified, unspecified vomiting type  AKI (acute kidney injury) ([place])  Cystitis      NEW MEDICATIONS STARTED DURING THIS VISIT:  New Prescriptions   No medications on file     Note:  This document was prepared using Dragon voice recognition software and may include unintentional dictation errors.    Merlyn Lot, MD 10/19/19 [phone]

## 2019-10-18 NOTE — ED Triage Notes (Signed)
Pt arrives from [facility] via [facility] with complaint of nausea and vomiting.  He states he ate dinner around 5 pm, started feeling nauseous after dinner, started vomiting about 1 hour ago, reports vomiting 4-5 times.  He states he feels a little nauseous right now, but it comes and it goes.  Pt is alert and oriented and in no apparent distress.  Vital signs are unremarkable.

## 2019-10-19 ENCOUNTER — Emergency Department: Payer: Medicare Other

## 2019-10-19 ENCOUNTER — Encounter: Payer: Self-pay | Admitting: Radiology

## 2019-10-19 DIAGNOSIS — Z20822 Contact with and (suspected) exposure to covid-19: Secondary | ICD-10-CM | POA: Diagnosis present

## 2019-10-19 DIAGNOSIS — Z820 Family history of epilepsy and other diseases of the nervous system: Secondary | ICD-10-CM | POA: Diagnosis not present

## 2019-10-19 DIAGNOSIS — K219 Gastro-esophageal reflux disease without esophagitis: Secondary | ICD-10-CM | POA: Diagnosis present

## 2019-10-19 DIAGNOSIS — A084 Viral intestinal infection, unspecified: Secondary | ICD-10-CM | POA: Diagnosis present

## 2019-10-19 DIAGNOSIS — Z79899 Other long term (current) drug therapy: Secondary | ICD-10-CM | POA: Diagnosis not present

## 2019-10-19 DIAGNOSIS — N3000 Acute cystitis without hematuria: Secondary | ICD-10-CM | POA: Diagnosis not present

## 2019-10-19 DIAGNOSIS — N179 Acute kidney failure, unspecified: Secondary | ICD-10-CM | POA: Diagnosis present

## 2019-10-19 DIAGNOSIS — Z8042 Family history of malignant neoplasm of prostate: Secondary | ICD-10-CM | POA: Diagnosis not present

## 2019-10-19 DIAGNOSIS — N3090 Cystitis, unspecified without hematuria: Secondary | ICD-10-CM | POA: Diagnosis present

## 2019-10-19 DIAGNOSIS — K529 Noninfective gastroenteritis and colitis, unspecified: Secondary | ICD-10-CM | POA: Diagnosis not present

## 2019-10-19 DIAGNOSIS — N390 Urinary tract infection, site not specified: Secondary | ICD-10-CM

## 2019-10-19 DIAGNOSIS — Z9049 Acquired absence of other specified parts of digestive tract: Secondary | ICD-10-CM | POA: Diagnosis not present

## 2019-10-19 DIAGNOSIS — E860 Dehydration: Secondary | ICD-10-CM | POA: Diagnosis present

## 2019-10-19 DIAGNOSIS — F329 Major depressive disorder, single episode, unspecified: Secondary | ICD-10-CM | POA: Diagnosis present

## 2019-10-19 DIAGNOSIS — Z85828 Personal history of other malignant neoplasm of skin: Secondary | ICD-10-CM | POA: Diagnosis not present

## 2019-10-19 DIAGNOSIS — Z66 Do not resuscitate: Secondary | ICD-10-CM | POA: Diagnosis present

## 2019-10-19 DIAGNOSIS — Z87891 Personal history of nicotine dependence: Secondary | ICD-10-CM | POA: Diagnosis not present

## 2019-10-19 DIAGNOSIS — I10 Essential (primary) hypertension: Secondary | ICD-10-CM

## 2019-10-19 DIAGNOSIS — N400 Enlarged prostate without lower urinary tract symptoms: Secondary | ICD-10-CM | POA: Diagnosis present

## 2019-10-19 DIAGNOSIS — H9190 Unspecified hearing loss, unspecified ear: Secondary | ICD-10-CM | POA: Diagnosis present

## 2019-10-19 DIAGNOSIS — Z7983 Long term (current) use of bisphosphonates: Secondary | ICD-10-CM | POA: Diagnosis not present

## 2019-10-19 DIAGNOSIS — E785 Hyperlipidemia, unspecified: Secondary | ICD-10-CM | POA: Diagnosis present

## 2019-10-19 LAB — URINALYSIS, COMPLETE (UACMP) WITH MICROSCOPIC
Bilirubin Urine: NEGATIVE
Glucose, UA: NEGATIVE mg/dL
Hgb urine dipstick: NEGATIVE
Ketones, ur: NEGATIVE mg/dL
Nitrite: NEGATIVE
Protein, ur: 100 mg/dL — AB
Specific Gravity, Urine: 1.026 (ref 1.005–1.030)
WBC, UA: >50 WBC/hpf — ABNORMAL HIGH (ref 0–5)
pH: 6 (ref 5.0–8.0)

## 2019-10-19 LAB — BASIC METABOLIC PANEL
Anion gap: 11 (ref 5–15)
BUN: 28 mg/dL — ABNORMAL HIGH (ref 8–23)
CO2: 24 mmol/L (ref 22–32)
Calcium: 8.5 mg/dL — ABNORMAL LOW (ref 8.9–10.3)
Chloride: 105 mmol/L (ref 98–111)
Creatinine, Ser: 1.08 mg/dL (ref 0.61–1.24)
GFR calc Af Amer: >60 mL/min (ref >60)
GFR calc non Af Amer: 59 mL/min — ABNORMAL LOW (ref >60)
Glucose, Bld: 114 mg/dL — ABNORMAL HIGH (ref 70–99)
Potassium: 3.7 mmol/L (ref 3.5–5.1)
Sodium: 140 mmol/L (ref 135–145)

## 2019-10-19 LAB — PROTIME-INR
INR: 1 (ref 0.8–1.2)
Prothrombin Time: 12.6 seconds (ref 11.4–15.2)

## 2019-10-19 LAB — COMPREHENSIVE METABOLIC PANEL
ALT: 14 U/L (ref 0–44)
AST: 20 U/L (ref 15–41)
Albumin: 3.8 g/dL (ref 3.5–5.0)
Alkaline Phosphatase: 72 U/L (ref 38–126)
Anion gap: 12 (ref 5–15)
BUN: 32 mg/dL — ABNORMAL HIGH (ref 8–23)
CO2: 24 mmol/L (ref 22–32)
Calcium: 8.9 mg/dL (ref 8.9–10.3)
Chloride: 104 mmol/L (ref 98–111)
Creatinine, Ser: 1.39 mg/dL — ABNORMAL HIGH (ref 0.61–1.24)
GFR calc Af Amer: 50 mL/min — ABNORMAL LOW (ref >60)
GFR calc non Af Amer: 43 mL/min — ABNORMAL LOW (ref >60)
Glucose, Bld: 125 mg/dL — ABNORMAL HIGH (ref 70–99)
Potassium: 3.7 mmol/L (ref 3.5–5.1)
Sodium: 140 mmol/L (ref 135–145)
Total Bilirubin: 0.6 mg/dL (ref 0.3–1.2)
Total Protein: 7.3 g/dL (ref 6.5–8.1)

## 2019-10-19 LAB — LACTIC ACID, PLASMA
Lactic Acid, Venous: 0.7 mmol/L (ref 0.5–1.9)
Lactic Acid, Venous: 0.7 mmol/L (ref 0.5–1.9)
Lactic Acid, Venous: 0.8 mmol/L (ref 0.5–1.9)

## 2019-10-19 LAB — TROPONIN I (HIGH SENSITIVITY)
Troponin I (High Sensitivity): 19 ng/L — ABNORMAL HIGH (ref <18)
Troponin I (High Sensitivity): 21 ng/L — ABNORMAL HIGH (ref <18)

## 2019-10-19 LAB — APTT: aPTT: 38 seconds — ABNORMAL HIGH (ref 24–36)

## 2019-10-19 LAB — LIPASE, BLOOD: Lipase: 28 U/L (ref 11–51)

## 2019-10-19 LAB — SARS CORONAVIRUS 2 BY RT PCR (HOSPITAL ORDER, PERFORMED IN ~~LOC~~ HOSPITAL LAB): SARS Coronavirus 2: NEGATIVE

## 2019-10-19 MED ORDER — ACETAMINOPHEN 650 MG RE SUPP: 650.0000 mg | Freq: Four times a day (QID) | RECTAL | Status: DC | PRN

## 2019-10-19 MED ORDER — ONDANSETRON HCL 4 MG PO TABS: 4.0000 mg | ORAL_TABLET | Freq: Four times a day (QID) | ORAL | Status: DC | PRN

## 2019-10-19 MED ORDER — SODIUM CHLORIDE 0.9 % IV SOLN: Freq: Once | INTRAVENOUS | Status: AC

## 2019-10-19 MED ORDER — TAMSULOSIN HCL 0.4 MG PO CAPS
0.4000 mg | ORAL_CAPSULE | Freq: Every day | ORAL | Status: DC
  Administered 2019-10-19 – 2019-10-21 (×3): 0.4 mg via ORAL
  Filled 2019-10-19 (×4): qty 1

## 2019-10-19 MED ORDER — SODIUM CHLORIDE 0.9 % IV SOLN
1.0000 g | INTRAVENOUS | Status: DC
  Administered 2019-10-19 – 2019-10-20 (×2): 1 g via INTRAVENOUS
  Filled 2019-10-19 (×3): qty 10

## 2019-10-19 MED ORDER — AMLODIPINE BESYLATE 10 MG PO TABS
10.0000 mg | ORAL_TABLET | Freq: Every day | ORAL | Status: DC
  Administered 2019-10-19 – 2019-10-21 (×3): 10 mg via ORAL
  Filled 2019-10-19 (×3): qty 1
  Filled 2019-10-19: qty 2

## 2019-10-19 MED ORDER — HYDRALAZINE HCL 25 MG PO TABS
25.0000 mg | ORAL_TABLET | Freq: Three times a day (TID) | ORAL | Status: DC
  Administered 2019-10-19 – 2019-10-20 (×6): 25 mg via ORAL
  Filled 2019-10-19 (×6): qty 1

## 2019-10-19 MED ORDER — MAGNESIUM HYDROXIDE 400 MG/5ML PO SUSP: 30.0000 mL | Freq: Every day | ORAL | Status: DC | PRN

## 2019-10-19 MED ORDER — FINASTERIDE 5 MG PO TABS
5.0000 mg | ORAL_TABLET | Freq: Every day | ORAL | Status: DC
  Administered 2019-10-19 – 2019-10-21 (×3): 5 mg via ORAL
  Filled 2019-10-19 (×4): qty 1

## 2019-10-19 MED ORDER — LABETALOL HCL 5 MG/ML IV SOLN: 20.0000 mg | INTRAVENOUS | Status: DC | PRN

## 2019-10-19 MED ORDER — ONDANSETRON HCL 4 MG/2ML IJ SOLN: 4.0000 mg | Freq: Four times a day (QID) | INTRAMUSCULAR | Status: DC | PRN

## 2019-10-19 MED ORDER — SODIUM CHLORIDE 0.9 % IV SOLN
1.0000 g | Freq: Once | INTRAVENOUS | Status: AC
  Administered 2019-10-19: 1 g via INTRAVENOUS
  Filled 2019-10-19: qty 10

## 2019-10-19 MED ORDER — PANTOPRAZOLE SODIUM 40 MG PO TBEC
40.0000 mg | DELAYED_RELEASE_TABLET | Freq: Every day | ORAL | Status: DC
  Administered 2019-10-19 – 2019-10-21 (×3): 40 mg via ORAL
  Filled 2019-10-19 (×4): qty 1

## 2019-10-19 MED ORDER — SODIUM CHLORIDE 0.9 % IV SOLN: INTRAVENOUS | Status: DC

## 2019-10-19 MED ORDER — IOHEXOL 300 MG/ML  SOLN
75.0000 mL | Freq: Once | INTRAMUSCULAR | Status: AC | PRN
  Administered 2019-10-19: 75 mL via INTRAVENOUS

## 2019-10-19 MED ORDER — DONEPEZIL HCL 5 MG PO TABS
5.0000 mg | ORAL_TABLET | Freq: Every day | ORAL | Status: DC
  Administered 2019-10-19 – 2019-10-21 (×3): 5 mg via ORAL
  Filled 2019-10-19 (×3): qty 1

## 2019-10-19 MED ORDER — TRAZODONE HCL 50 MG PO TABS
25.0000 mg | ORAL_TABLET | Freq: Every evening | ORAL | Status: DC | PRN
  Administered 2019-10-20: 25 mg via ORAL
  Filled 2019-10-19: qty 1

## 2019-10-19 MED ORDER — ACETAMINOPHEN 325 MG PO TABS
650.0000 mg | ORAL_TABLET | Freq: Four times a day (QID) | ORAL | Status: DC | PRN
  Administered 2019-10-19: 650 mg via ORAL
  Filled 2019-10-19: qty 2

## 2019-10-19 MED ORDER — ENOXAPARIN SODIUM 40 MG/0.4ML ~~LOC~~ SOLN
40.0000 mg | SUBCUTANEOUS | Status: DC
  Administered 2019-10-19 – 2019-10-21 (×3): 40 mg via SUBCUTANEOUS
  Filled 2019-10-19 (×4): qty 0.4

## 2019-10-19 MED ORDER — PRAVASTATIN SODIUM 20 MG PO TABS
20.0000 mg | ORAL_TABLET | Freq: Every day | ORAL | Status: DC
  Administered 2019-10-19 – 2019-10-20 (×2): 20 mg via ORAL
  Filled 2019-10-19 (×3): qty 1

## 2019-10-19 MED ORDER — SERTRALINE HCL 50 MG PO TABS
25.0000 mg | ORAL_TABLET | Freq: Every day | ORAL | Status: DC
  Administered 2019-10-19 – 2019-10-21 (×3): 25 mg via ORAL
  Filled 2019-10-19 (×4): qty 1

## 2019-10-19 NOTE — ED Notes (Signed)
Mepilex applied to R elbow, where patient has a scab

## 2019-10-19 NOTE — Progress Notes (Signed)
No charge progress note.  Billy Bennett  is a 86 y.o. Caucasian male with a known history of hypertension, dyslipidemia, GERD and BPH who presented to the emergency room with acute onset of intractable nausea and vomiting with associated diarrhea without abdominal pain over the last 24 hours.  He admits to urinary frequency and urgency without dysuria or hematuria or flank pain he denied any abdominal pain. UA looks infected and he was started on Rocephin.  Blood cultures remain negative in 12 hours.  Urine cultures pending.  Patient is very hard of hearing when seen today.  He was asking for with breakfast.  We will continue with current management and de-escalate antibiotics once culture results become available.

## 2019-10-19 NOTE — ED Notes (Signed)
Confirmed DNR with pt and placed purple band on right wrist

## 2019-10-19 NOTE — ED Notes (Signed)
Patient tolerating PO well.  100% of breakfast and lunch eaten.  No c/o N/V/D.  Denies complaints.

## 2019-10-19 NOTE — H&P (Addendum)
at [place] NAME: Billy Bennett    MR#:  [id number]  DATE OF BIRTH:  1924/04/07  DATE OF ADMISSION:  10/18/2019  PRIMARY CARE PHYSICIAN: Gayland Curry, MD   REQUESTING/REFERRING PHYSICIAN: Merlyn Lot, MD  CHIEF COMPLAINT:   Chief Complaint  Patient presents with  . Emesis    HISTORY OF PRESENT ILLNESS:  Billy Bennett  is a 86 y.o. Caucasian male with a known history of hypertension, dyslipidemia, GERD and BPH who presented to the emergency room with acute onset of intractable nausea and vomiting with associated diarrhea without abdominal pain over the last 24 hours.  He admits to urinary frequency and urgency without dysuria or hematuria or flank pain he denied any abdominal pain.  His last Covid vaccine was a long time ago.  No chest pain or dyspnea or palpitations or cough or wheezing or hemoptysis.  No bilious vomitus or hematemesis.  Upon presentation to the emergency room, blood pressure was 149/75 with otherwise normal vital signs.  Labs revealed a BUN of 32 and creatinine 1.39 up from 27 and 0.96 on 10/03/2019.Marland Kitchen  Serum lipase was normal at 28. Lactic acid was 0.8.  CBC showed leukocytosis of 14.2 with neutrophilia.  UA was positive for UTI.  Blood and urine culture was sent.  Noncontrasted head CT scan revealed chronic atrophic and ischemic changes and chronic sinus disease on the right in the ethmoid and maxillary antra with no acute abnormalities noted.  Diverticulosis without diverticulitis and chronic changes.  Portable chest x-ray showed chronic appearing increased lung markings without evidence of acute or active cardiopulmonary disease. EKG showed sinus rhythm with a rate of 97 with PACs and poor R wave progression.  The patient was given 500 mL IV normal saline bolus and 1 g of IV Rocephin as well as 4 mg of IV Zofran.  He will be admitted to the a medical monitored bed for further evaluation and management. Abdominal and pelvic  CT scan revealed   Past Medical History:  Diagnosis Date  . Anxiety   . Arthritis   . Enlarged prostate   . GERD (gastroesophageal reflux disease)   . Heart murmur, systolic 36/14/4315  . HOH (hard of hearing)   . Hypercholesteremia   . Hypertension   . Other constipation 07/01/2017  . Pure hypercholesterolemia 04/01/2011  . Skin cancer 08/22/2016   Overview:  Patient sees Dr. Phillip Heal    PAST SURGICAL HISTORY:   Past Surgical History:  Procedure Laterality Date  . ANAL FISSURE REPAIR    . CHOLECYSTECTOMY    . EYE SURGERY Bilateral    Cataract Extraction with IOL  . KNEE ARTHROSCOPY Right 07/08/2016   Procedure: ARTHROSCOPY KNEE, PARTIAL MEDIAL MENISECTOMY, CHONDROPLASTY;  Surgeon: Dereck Leep, MD;  Location: [facility] ORS;  Service: Orthopedics;  Laterality: Right;    SOCIAL HISTORY:   Social History   Tobacco Use  . Smoking status: Former Smoker    Types: Cigarettes    Quit date: 04/14/1977    Years since quitting: 42.5  . Smokeless tobacco: Never Used  Substance Use Topics  . Alcohol use: No    FAMILY HISTORY:   Family History  Problem Relation Age of Onset  . Alzheimer's disease Father   . Prostate cancer Brother     DRUG ALLERGIES:   Allergies  Allergen Reactions  . Ace Inhibitors Cough  . Telmisartan-Hctz Cough    Other Reaction: ARBS - COUGH  . Amoxicillin-Pot Clavulanate Diarrhea  Has patient had a PCN reaction causing immediate rash, facial/tongue/throat swelling, SOB or lightheadedness with hypotension:unsure Has patient had a PCN reaction causing severe rash involving mucus membranes or skin necrosis:unsure Has patient had a PCN reaction that required hospitalization:unsure  Has patient had a PCN reaction occurring within the last 10 years:Yes If all of the above answers are "NO", then may proceed with Cephalosporin use.   . Ibuprofen Other (See Comments)    Ankle swelling ( can take naprosyn)    REVIEW OF SYSTEMS:   ROS As per history of  present illness. All pertinent systems were reviewed above. Constitutional,  HEENT, cardiovascular, respiratory, GI, GU, musculoskeletal, neuro, psychiatric, endocrine,  integumentary and hematologic systems were reviewed and are otherwise  negative/unremarkable except for positive findings mentioned above in the HPI.   MEDICATIONS AT HOME:   Prior to Admission medications   Medication Sig Start Date End Date Taking? Authorizing Provider  amLODipine (NORVASC) 10 MG tablet Take 1 tablet (10 mg total) by mouth daily. 03/04/19   Fritzi Mandes, MD  donepezil (ARICEPT) 5 MG tablet Take 5 mg by mouth daily. 01/25/19   [provider]  finasteride (PROSCAR) 5 MG tablet Take 5 mg by mouth daily.    [provider]  hydrALAZINE (APRESOLINE) 25 MG tablet Take 1 tablet (25 mg total) by mouth 3 (three) times daily. 03/03/19   Fritzi Mandes, MD  hydrochlorothiazide (MICROZIDE) 12.5 MG capsule Take 1 capsule (12.5 mg total) by mouth daily. 03/03/19   Fritzi Mandes, MD  omeprazole (PRILOSEC) 20 MG capsule Take 20 mg by mouth 2 (two) times daily. 10/02/18 10/02/19  [provider]  pravastatin (PRAVACHOL) 20 MG tablet Take 20 mg by mouth at bedtime.  09/30/15   [provider]  sertraline (ZOLOFT) 25 MG tablet Take 25 mg by mouth daily. 01/25/19   [provider]  tamsulosin (FLOMAX) 0.4 MG CAPS capsule Take 0.4 mg by mouth.    [provider]      VITAL SIGNS:  Blood pressure 137/62, pulse 88, temperature 98.3 F (36.8 C), temperature source Oral, resp. rate 19, height 5\' 6"  (1.676 m), weight 64.9 kg, SpO2 95 %.  PHYSICAL EXAMINATION:  Physical Exam  GENERAL: Ill looking 86 y.o.-year-old Caucasian male patient lying in the bed with no acute distress.  EYES: Pupils equal, round, reactive to light and accommodation. No scleral icterus. Extraocular muscles intact.  HEENT: Head atraumatic, normocephalic. Oropharynx and nasopharynx clear.  NECK:  Supple, no  jugular venous distention. No thyroid enlargement, no tenderness.  LUNGS: Normal breath sounds bilaterally, no wheezing, rales,rhonchi or crepitation. No use of accessory muscles of respiration.  CARDIOVASCULAR: Regular rate and rhythm, S1, S2 normal.  2/6 systolic ejection murmur at the left lower sternal border with no rubs, or gallops.  ABDOMEN: Soft, nondistended, nontender. Bowel sounds present. No organomegaly or mass.  EXTREMITIES: No pedal edema, cyanosis, or clubbing.  NEUROLOGIC: Cranial nerves II through XII are intact. Muscle strength 5/5 in all extremities. Sensation intact. Gait not checked.  PSYCHIATRIC: The patient is alert and oriented x 3.  Normal affect and good eye contact. SKIN: No obvious rash, lesion, or ulcer.   LABORATORY PANEL:   CBC Recent Labs  Lab 10/18/19 2303  WBC 14.2*  HGB 12.1*  HCT 35.8*  PLT 261   ------------------------------------------------------------------------------------------------------------------  Chemistries  Recent Labs  Lab 10/18/19 2303  NA 140  K 3.7  CL 104  CO2 24  GLUCOSE 125*  BUN 32*  CREATININE 1.39*  CALCIUM 8.9  AST 20  ALT 14  ALKPHOS 72  BILITOT 0.6   ------------------------------------------------------------------------------------------------------------------  Cardiac Enzymes No results for input(s): TROPONINI in the last 168 hours. ------------------------------------------------------------------------------------------------------------------  RADIOLOGY:  CT Head Wo Contrast  Result Date: 10/19/2019 CLINICAL DATA:  Headaches and nausea EXAM: CT HEAD WITHOUT CONTRAST TECHNIQUE: Contiguous axial images were obtained from the base of the skull through the vertex without intravenous contrast. COMPARISON:  10/03/2019 FINDINGS: Brain: Chronic atrophic and white matter ischemic changes are again noted and stable. No acute hemorrhage or acute infarct is seen. Vascular: No hyperdense vessel or unexpected  calcification. Skull: Normal. Negative for fracture or focal lesion. Sinuses/Orbits: Chronic expansile opacification of the right maxillary antrum is noted consistent with mucosal retention cyst. This is stable in appearance. Similar changes are noted in the right ethmoid sinuses also stable in appearance. Other: None. IMPRESSION: Chronic atrophic and ischemic changes. Chronic sinus disease on the right in the ethmoid and maxillary antra. No acute abnormality noted. Electronically Signed   By: Inez Catalina M.D.   On: 10/19/2019 01:30   CT ABDOMEN PELVIS W CONTRAST  Result Date: 10/19/2019 CLINICAL DATA:  Nausea and vomiting following dinner EXAM: CT ABDOMEN AND PELVIS WITH CONTRAST TECHNIQUE: Multidetector CT imaging of the abdomen and pelvis was performed using the standard protocol following bolus administration of intravenous contrast. CONTRAST:  76mL OMNIPAQUE IOHEXOL 300 MG/ML  SOLN COMPARISON:  None. FINDINGS: Lower chest: No acute abnormality. Hepatobiliary: No focal liver abnormality is seen. Status post cholecystectomy. No biliary dilatation. Pancreas: Unremarkable. No pancreatic ductal dilatation or surrounding inflammatory changes. Spleen: Normal in size without focal abnormality. Adrenals/Urinary Tract: Adrenal glands are within normal limits. Kidneys demonstrate a normal enhancement pattern bilaterally. Renal cysts are noted on the left. No renal calculi or obstructive changes are seen. Normal excretion of contrast is noted bilaterally. The bladder is well distended. Stomach/Bowel: Scattered diverticular change of the colon is noted without evidence of diverticulitis. No obstructive or inflammatory changes of the colon are noted. The appendix is within normal limits. No gastric or small bowel abnormality is noted. 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 The maximum aortic dimension is 3 cm and transverse diameter. No significant lymphadenopathy is  noted. Reproductive: Prostate is unremarkable. Other: No abdominal wall hernia or abnormality. No abdominopelvic ascites. Musculoskeletal: Degenerative changes of lumbar spine are noted. IMPRESSION: Diverticulosis without diverticulitis. Chronic changes as described above without acute abnormality. Electronically Signed   By: Inez Catalina M.D.   On: 10/19/2019 01:28   DG Chest Portable 1 View  Result Date: 10/18/2019 CLINICAL DATA:  Nausea EXAM: PORTABLE CHEST 1 VIEW COMPARISON:  February 27, 2019 FINDINGS: Mild diffuse chronic appearing increased lung markings are seen. There is no evidence of acute infiltrate, pleural effusion or pneumothorax. There is mild to moderate severity enlargement of the cardiac silhouette. There is marked severity calcification of the aortic arch. Multilevel degenerative changes are seen throughout the thoracic spine. IMPRESSION: Chronic appearing increased lung markings without evidence of acute or active cardiopulmonary disease. Electronically Signed   By: Virgina Norfolk M.D.   On: 10/18/2019 23:52      IMPRESSION AND PLAN:   1.  Intractable nausea and vomiting with diarrhea concerning for acute gastroenteritis. -The patient will be admitted to a medical monitored bed. -She will be placed on hydration with IV normal saline. -We will follow her BMP. -He will be placed on PPI therapy. -Stool studies will be obtained.  2.  Acute lower  UTI with subsequent mild sepsis without severe sepsis or septic shock.  This is manifested by leukocytosis and tachypnea. -The patient will be placed on IV Rocephin and follow urine culture and sensitivity and blood cultures.  3.  Mild acute kidney injury. -This is likely prerenal from volume depletion and dehydration due to #1. -We will hydrate with IV normal saline and follow BMP.  4.  Hypertension. -We will continue hydralazine and hold off HCTZ.  5.  BPH. -Flomax will be continued.  6.  Depression. -Zoloft will be  resumed.  7.  Dyslipidemia. -Statin therapy will be resumed.  8.  DVT prophylaxis. -Subcutaneous Lovenox.    All the records are reviewed and case discussed with ED provider. The plan of care was discussed in details with the patient (and family). I answered all questions. The patient agreed to proceed with the above mentioned plan. Further management will depend upon hospital course.   CODE STATUS: The patient is DNR/DNI.  Status is: Inpatient  Remains inpatient appropriate because:Ongoing diagnostic testing needed not appropriate for outpatient work up, Unsafe d/c plan, IV treatments appropriate due to intensity of illness or inability to take PO and Inpatient level of care appropriate due to severity of illness   Dispo: The patient is from: Home              Anticipated d/c is to: Home              Anticipated d/c date is: 2 days              Patient currently is not medically stable to d/c.   TOTAL TIME TAKING CARE OF THIS PATIENT: 55 minutes.    Christel Mormon M.D on 10/19/2019 at 2:32 AM  [facility]   From 7 PM-7 AM, contact night-coverage [email or web address]  CC: Primary care physician; Gayland Curry, MD   Note: This dictation was prepared with Dragon dictation along with smaller phrase technology. Any transcriptional typo errors that result from this process are unintentional.

## 2019-10-19 NOTE — ED Notes (Signed)
Pt transported to CT ?

## 2019-10-19 NOTE — ED Notes (Signed)
Pt found to be incontinent of urine; brief and bedding changed, gown and socks placed.

## 2019-10-19 NOTE — ED Notes (Signed)
Dr Niu at bedside 

## 2019-10-20 DIAGNOSIS — N3000 Acute cystitis without hematuria: Secondary | ICD-10-CM

## 2019-10-20 LAB — MRSA PCR SCREENING: MRSA by PCR: NEGATIVE

## 2019-10-20 LAB — C DIFFICILE QUICK SCREEN W PCR REFLEX
C Diff antigen: NEGATIVE
C Diff interpretation: NOT DETECTED
C Diff toxin: NEGATIVE

## 2019-10-20 LAB — LACTOFERRIN, FECAL, QUALITATIVE: Lactoferrin, Fecal, Qual: POSITIVE — AB

## 2019-10-20 NOTE — TOC Initial Note (Addendum)
Transition of Care (TOC) - Initial/Assessment Note    Patient Details  Name: Billy Bennett. MRN: [id number] Date of Birth: January 12, 1924  Transition of Care [facility]) CM/SW Contact:    Candie Chroman, LCSW Phone Number: 10/20/2019, 11:59 AM  Clinical Narrative:  Per ED notes, patient was admitted from [facility]. CSW spoke with [place] at [facility] who confirmed he is an ALF resident there. Patient fully oriented at baseline. No home health prior to admission. He uses a walker at baseline. Patient's HCPOA is his nephew Dodger Sinning [phone]) but his neighbor Trixie Dredge comes to see him at the facility.                2:34 pm: Received call from patient's nephew. He confirmed he is HCPOA and that plan is for patient to return [facility] at discharge. He asked that MD call with an update. CSW sent secure chat to MD to notify.   Expected Discharge Plan: Assisted Living Barriers to Discharge: Continued Medical Work up   Patient Goals and CMS Choice Patient states their goals for this hospitalization and ongoing recovery are:: Patient only oriented to self.   Choice offered to / list presented to : NA  Expected Discharge Plan and Services Expected Discharge Plan: Assisted Living       Living arrangements for the past 2 months: [place]                                      Prior Living Arrangements/Services Living arrangements for the past 2 months: [place] Lives with:: Facility Resident Patient language and need for interpreter reviewed:: Yes Do you feel safe going back to the place where you live?: Yes      Need for Family Participation in Patient Care: Yes (Comment) Care giver support system in place?: Yes (comment)   Criminal Activity/Legal Involvement Pertinent to Current Situation/Hospitalization: No - Comment as needed  Activities of Daily Living Home Assistive Devices/Equipment: Gilford Rile (specify type) ADL Screening  (condition at time of admission) Patient's cognitive ability adequate to safely complete daily activities?: No Is the patient deaf or have difficulty hearing?: Yes Does the patient have difficulty seeing, even when wearing glasses/contacts?: No Does the patient have difficulty concentrating, remembering, or making decisions?: No Patient able to express need for assistance with ADLs?: Yes Does the patient have difficulty dressing or bathing?: No Independently performs ADLs?: Yes (appropriate for developmental age) Does the patient have difficulty walking or climbing stairs?: Yes Weakness of Legs: Both Weakness of Arms/Hands: None  Permission Sought/Granted Permission sought to share information with : Investment banker, corporate granted to share info w AGENCY: [facility]        Emotional Assessment Appearance:: Appears stated age Attitude/Demeanor/Rapport: Unable to Assess Affect (typically observed): Unable to Assess Orientation: : Oriented to Self Alcohol / Substance Use: Not Applicable Psych Involvement: No (comment)  Admission diagnosis:  UTI (urinary tract infection) [N39.0] AKI (acute kidney injury) ([place]) [N17.9] Cystitis [N30.90] Nausea and vomiting, intractability of vomiting not specified, unspecified vomiting type [R11.2] Patient Active Problem List   Diagnosis Date Noted  . UTI (urinary tract infection) 10/19/2019  . Hemorrhagic stroke ([place])   . Traumatic rhabdomyolysis ([place])   . Gastroesophageal reflux disease without esophagitis   . Head injury   . Acute metabolic encephalopathy 55/97/4163  . Hypokalemia  02/27/2019  . Elevated troponin 02/27/2019  . Fall   . At high risk for falls 08/28/2017  . Fecal smearing 07/01/2017  . Other constipation 07/01/2017  . Heart murmur, systolic 62/26/3335  . Skin cancer 08/22/2016  . Status post arthroscopy 07/16/2016  . Pure hypercholesterolemia 04/01/2011  . Hypertension 01/20/2011   PCP:   Gayland Curry, MD Pharmacy:   [facility], [place] [address] Phone: [phone] Fax: [phone]     Social Determinants of Health (SDOH) Interventions    Readmission Risk Interventions No flowsheet data found.

## 2019-10-21 LAB — CBC
HCT: 33.2 % — ABNORMAL LOW (ref 39.0–52.0)
Hemoglobin: 11.7 g/dL — ABNORMAL LOW (ref 13.0–17.0)
MCH: 29.7 pg (ref 26.0–34.0)
MCHC: 35.2 g/dL (ref 30.0–36.0)
MCV: 84.3 fL (ref 80.0–100.0)
Platelets: 236 10*3/uL (ref 150–400)
RBC: 3.94 MIL/uL — ABNORMAL LOW (ref 4.22–5.81)
RDW: 15.7 % — ABNORMAL HIGH (ref 11.5–15.5)
WBC: 10.9 10*3/uL — ABNORMAL HIGH (ref 4.0–10.5)
nRBC: 0 % (ref 0.0–0.2)

## 2019-10-21 LAB — BASIC METABOLIC PANEL
Anion gap: 10 (ref 5–15)
BUN: 17 mg/dL (ref 8–23)
CO2: 24 mmol/L (ref 22–32)
Calcium: 8.8 mg/dL — ABNORMAL LOW (ref 8.9–10.3)
Chloride: 105 mmol/L (ref 98–111)
Creatinine, Ser: 0.85 mg/dL (ref 0.61–1.24)
GFR calc Af Amer: >60 mL/min (ref >60)
GFR calc non Af Amer: >60 mL/min (ref >60)
Glucose, Bld: 98 mg/dL (ref 70–99)
Potassium: 3.2 mmol/L — ABNORMAL LOW (ref 3.5–5.1)
Sodium: 139 mmol/L (ref 135–145)

## 2019-10-21 LAB — URINE CULTURE: Culture: >=100000 — AB

## 2019-10-21 LAB — MAGNESIUM: Magnesium: 1.7 mg/dL (ref 1.7–2.4)

## 2019-10-21 MED ORDER — SULFAMETHOXAZOLE-TRIMETHOPRIM 800-160 MG PO TABS: 1.0000 | ORAL_TABLET | Freq: Two times a day (BID) | ORAL | 0 refills | Status: DC

## 2019-10-21 MED ORDER — POTASSIUM CHLORIDE 20 MEQ PO PACK
40.0000 meq | PACK | Freq: Once | ORAL | Status: AC
  Administered 2019-10-21: 40 meq via ORAL
  Filled 2019-10-21: qty 2

## 2019-10-21 MED ORDER — HYDRALAZINE HCL 50 MG PO TABS
50.0000 mg | ORAL_TABLET | Freq: Three times a day (TID) | ORAL | Status: DC
  Administered 2019-10-21 (×2): 50 mg via ORAL
  Filled 2019-10-21 (×2): qty 1

## 2019-10-21 MED ORDER — CEPHALEXIN 500 MG PO CAPS: 500.0000 mg | ORAL_CAPSULE | Freq: Two times a day (BID) | ORAL | 0 refills | Status: AC

## 2019-10-21 MED ORDER — SULFAMETHOXAZOLE-TRIMETHOPRIM 800-160 MG PO TABS
1.0000 | ORAL_TABLET | Freq: Two times a day (BID) | ORAL | Status: DC
  Administered 2019-10-21: 1 via ORAL
  Filled 2019-10-21 (×2): qty 1

## 2019-10-21 MED ORDER — CEPHALEXIN 500 MG PO CAPS: 500.0000 mg | ORAL_CAPSULE | Freq: Two times a day (BID) | ORAL | Status: DC

## 2019-10-21 MED ORDER — POTASSIUM CHLORIDE CRYS ER 10 MEQ PO TBCR
EXTENDED_RELEASE_TABLET | ORAL | Status: AC
  Filled 2019-10-21: qty 1

## 2019-10-21 NOTE — Discharge Summary (Signed)
Physician Discharge Summary   Latwan Luchsinger  male DOB: [birth year]  QQP:[id number]  PCP: Gayland Curry, MD  Admit date: 10/18/2019 Discharge date: 10/21/2019  Admitted From: assisted living Disposition:  Assisted living [facility]) updated on the phone prior to discharge.  CODE STATUS: DNR   Hospital Course:  For full details, please see H&P, progress notes, consult notes and ancillary notes.  Briefly,  GeorgeNewmanis [phone] y.o.Caucasian malewith a known history of hypertension, dyslipidemia, GERD and BPH who presented to the emergency room with acute onset of intractable nausea and vomiting with associated diarrhea without abdominal pain over the last 24 hours. He admited to urinary frequency and urgency without dysuria or hematuria or flank pain.   Pt denied any abdominal pain.    1. Intractable nausea and vomiting with diarrhea likey 2/2 viral gastroenteritis. Symptoms resolved after presentation.  Pt was able to eat/drink without problem.  C diff neg.  Pt received IVF initially, which was d/c'ed after pt was having adequate oral intake.  2. Acute lower UTI Urinary frequency was the main symptom.  Pt was started on ceftriaxone on presentation and received 3 doses.  Urine cx grew Klebsiella.  Abx de-escalate to Keflex per cx sensitivities.  Pt will continue Keflex for 4 more days.  3. Mild acute kidney injury. Cr 1.39 on presentation.  Baseline around 0.9.  This is likely prerenal from volume depletion and dehydration due to #1.  Pt received IVF hydration with resolution of AKI.  Cr 0.85 on the day of discharge.  4. Hypertension. Home hydralazine and amlodipine continued and HCTZ held 2/2 AKI.  All resumed at discharge.  BP varied wildly during hospitalization, so no change was made to pt's home BP regimen.  5. BPH. Flomax continued.  6. Depression. Zoloft continued  7. Dyslipidemia. Statin continued.   Discharge  Diagnoses:  Active Problems:   UTI (urinary tract infection)    Discharge Instructions:  Allergies as of 10/21/2019      Reactions   Ace Inhibitors Cough   Telmisartan-hctz Cough   Other Reaction: ARBS - COUGH   Amoxicillin-pot Clavulanate Diarrhea   Has patient had a PCN reaction causing immediate rash, facial/tongue/throat swelling, SOB or lightheadedness with hypotension:unsure Has patient had a PCN reaction causing severe rash involving mucus membranes or skin necrosis:unsure Has patient had a PCN reaction that required hospitalization:unsure  Has patient had a PCN reaction occurring within the last 10 years:Yes If all of the above answers are "NO", then may proceed with Cephalosporin use.   Ibuprofen Other (See Comments)   Ankle swelling ( can take naprosyn)      Medication List    TAKE these medications   acetaminophen 500 MG tablet Commonly known as: TYLENOL Take 1,000 mg by mouth in the morning, at noon, and at bedtime.   amLODipine 10 MG tablet Commonly known as: NORVASC Take 1 tablet (10 mg total) by mouth daily.   cephALEXin 500 MG capsule Commonly known as: KEFLEX Take 1 capsule (500 mg total) by mouth every 12 (twelve) hours for 4 days.   donepezil 5 MG tablet Commonly known as: ARICEPT Take 5 mg by mouth daily.   ferrous sulfate 325 (65 FE) MG tablet Take 325 mg by mouth daily with breakfast.   finasteride 5 MG tablet Commonly known as: PROSCAR Take 5 mg by mouth daily.   gabapentin 100 MG capsule Commonly known as: NEURONTIN Take 100 mg by mouth at bedtime.   hydrALAZINE 25 MG  tablet Commonly known as: APRESOLINE Take 1 tablet (25 mg total) by mouth 3 (three) times daily.   hydrochlorothiazide 12.5 MG capsule Commonly known as: MICROZIDE Take 1 capsule (12.5 mg total) by mouth daily.   omeprazole 20 MG capsule Commonly known as: PRILOSEC Take 20 mg by mouth 2 (two) times daily.   pravastatin 20 MG tablet Commonly known as: PRAVACHOL Take  20 mg by mouth at bedtime.   sertraline 25 MG tablet Commonly known as: ZOLOFT Take 25 mg by mouth daily.   tamsulosin 0.4 MG Caps capsule Commonly known as: FLOMAX Take 0.4 mg by mouth.   traMADol 50 MG tablet Commonly known as: ULTRAM Take 50 mg by mouth in the morning, at noon, and at bedtime.        Follow-up Information    Gayland Curry, MD. Go on 10/27/2019.   Specialty: Family Medicine Why: 8:40am appointment Contact information: [place] [address] [phone]               Allergies  Allergen Reactions  . Ace Inhibitors Cough  . Telmisartan-Hctz Cough    Other Reaction: ARBS - COUGH  . Amoxicillin-Pot Clavulanate Diarrhea    Has patient had a PCN reaction causing immediate rash, facial/tongue/throat swelling, SOB or lightheadedness with hypotension:unsure Has patient had a PCN reaction causing severe rash involving mucus membranes or skin necrosis:unsure Has patient had a PCN reaction that required hospitalization:unsure  Has patient had a PCN reaction occurring within the last 10 years:Yes If all of the above answers are "NO", then may proceed with Cephalosporin use.   . Ibuprofen Other (See Comments)    Ankle swelling ( can take naprosyn)     The results of significant diagnostics from this hospitalization (including imaging, microbiology, ancillary and laboratory) are listed below for reference.   Consultations:   Procedures/Studies: CT Head Wo Contrast  Result Date: 10/19/2019 CLINICAL DATA:  Headaches and nausea EXAM: CT HEAD WITHOUT CONTRAST TECHNIQUE: Contiguous axial images were obtained from the base of the skull through the vertex without intravenous contrast. COMPARISON:  10/03/2019 FINDINGS: Brain: Chronic atrophic and white matter ischemic changes are again noted and stable. No acute hemorrhage or acute infarct is seen. Vascular: No hyperdense vessel or unexpected calcification. Skull: Normal. Negative for fracture  or focal lesion. Sinuses/Orbits: Chronic expansile opacification of the right maxillary antrum is noted consistent with mucosal retention cyst. This is stable in appearance. Similar changes are noted in the right ethmoid sinuses also stable in appearance. Other: None. IMPRESSION: Chronic atrophic and ischemic changes. Chronic sinus disease on the right in the ethmoid and maxillary antra. No acute abnormality noted. Electronically Signed   By: Inez Catalina M.D.   On: 10/19/2019 01:30   CT Head Wo Contrast  Result Date: 10/03/2019 CLINICAL DATA:  Fall with head trauma. EXAM: CT HEAD WITHOUT CONTRAST TECHNIQUE: Contiguous axial images were obtained from the base of the skull through the vertex without intravenous contrast. COMPARISON:  02/27/2019 FINDINGS: Brain: Ventricles and cisterns are normal. CSF spaces are compatible with mild atrophy which is unchanged. There is mild chronic ischemic microvascular disease. There is no mass, mass effect, shift of midline structures or acute hemorrhage. No evidence of acute infarction. Vascular: No hyperdense vessel or unexpected calcification. Skull: Normal. Negative for fracture or focal lesion. Sinuses/Orbits: Orbits are normal. There is complete opacification of the right maxillary sinus and mild opacification over the ethmoid air cells unchanged. Other: None. IMPRESSION: 1.  No acute findings. 2.  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 3.  Stable chronic sinus inflammatory disease. Electronically Signed   By: Marin Olp M.D.   On: 10/03/2019 11:11   CT ABDOMEN PELVIS W CONTRAST  Result Date: 10/19/2019 CLINICAL DATA:  Nausea and vomiting following dinner EXAM: CT ABDOMEN AND PELVIS WITH CONTRAST TECHNIQUE: Multidetector CT imaging of the abdomen and pelvis was performed using the standard protocol following bolus administration of intravenous contrast. CONTRAST:  2mL OMNIPAQUE IOHEXOL 300 MG/ML  SOLN COMPARISON:  None. FINDINGS: Lower  chest: No acute abnormality. Hepatobiliary: No focal liver abnormality is seen. Status post cholecystectomy. No biliary dilatation. Pancreas: Unremarkable. No pancreatic ductal dilatation or surrounding inflammatory changes. Spleen: Normal in size without focal abnormality. Adrenals/Urinary Tract: Adrenal glands are within normal limits. Kidneys demonstrate a normal enhancement pattern bilaterally. Renal cysts are noted on the left. No renal calculi or obstructive changes are seen. Normal excretion of contrast is noted bilaterally. The bladder is well distended. Stomach/Bowel: Scattered diverticular change of the colon is noted without evidence of diverticulitis. No obstructive or inflammatory changes of the colon are noted. The appendix is within normal limits. No gastric or small bowel abnormality is noted. 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 The maximum aortic dimension is 3 cm and transverse diameter. No significant lymphadenopathy is noted. Reproductive: Prostate is unremarkable. Other: No abdominal wall hernia or abnormality. No abdominopelvic ascites. Musculoskeletal: Degenerative changes of lumbar spine are noted. IMPRESSION: Diverticulosis without diverticulitis. Chronic changes as described above without acute abnormality. Electronically Signed   By: Inez Catalina M.D.   On: 10/19/2019 01:28   DG Chest Portable 1 View  Result Date: 10/18/2019 CLINICAL DATA:  Nausea EXAM: PORTABLE CHEST 1 VIEW COMPARISON:  February 27, 2019 FINDINGS: Mild diffuse chronic appearing increased lung markings are seen. There is no evidence of acute infiltrate, pleural effusion or pneumothorax. There is mild to moderate severity enlargement of the cardiac silhouette. There is marked severity calcification of the aortic arch. Multilevel degenerative changes are seen throughout the thoracic spine. IMPRESSION: Chronic appearing increased lung markings without evidence of  acute or active cardiopulmonary disease. Electronically Signed   By: Virgina Norfolk M.D.   On: 10/18/2019 23:52      Labs: BNP (last 3 results) No results for input(s): BNP in the last 8760 hours. Basic Metabolic Panel: Recent Labs  Lab 10/18/19 2303 10/19/19 0451 10/21/19 0436  NA 140 140 139  K 3.7 3.7 3.2*  CL 104 105 105  CO2 24 24 24   GLUCOSE 125* 114* 98  BUN 32* 28* 17  CREATININE 1.39* 1.08 0.85  CALCIUM 8.9 8.5* 8.8*  MG  --   --  1.7   Liver Function Tests: Recent Labs  Lab 10/18/19 2303  AST 20  ALT 14  ALKPHOS 72  BILITOT 0.6  PROT 7.3  ALBUMIN 3.8   Recent Labs  Lab 10/18/19 2303  LIPASE 28   No results for input(s): AMMONIA in the last 168 hours. CBC: Recent Labs  Lab 10/18/19 2303 10/21/19 0436  WBC 14.2* 10.9*  NEUTROABS 12.4*  --   HGB 12.1* 11.7*  HCT 35.8* 33.2*  MCV 86.3 84.3  PLT 261 236   Cardiac Enzymes: No results for input(s): CKTOTAL, CKMB, CKMBINDEX, TROPONINI in the last 168 hours. BNP: Invalid input(s): POCBNP CBG: No results for input(s): GLUCAP in the last 168 hours. D-Dimer No results for input(s): DDIMER in the last 72 hours. Hgb A1c No results for input(s): HGBA1C  in the last 72 hours. Lipid Profile No results for input(s): CHOL, HDL, LDLCALC, TRIG, CHOLHDL, LDLDIRECT in the last 72 hours. Thyroid function studies No results for input(s): TSH, T4TOTAL, T3FREE, THYROIDAB in the last 72 hours.  Invalid input(s): FREET3 Anemia work up No results for input(s): VITAMINB12, FOLATE, FERRITIN, TIBC, IRON, RETICCTPCT in the last 72 hours. Urinalysis    Component Value Date/Time   COLORURINE YELLOW (A) 10/19/2019 0146   APPEARANCEUR CLOUDY (A) 10/19/2019 0146   APPEARANCEUR Cloudy 12/03/2013 1328   LABSPEC 1.026 10/19/2019 0146   LABSPEC 1.026 12/03/2013 1328   PHURINE 6.0 10/19/2019 0146   GLUCOSEU NEGATIVE 10/19/2019 0146   GLUCOSEU Negative 12/03/2013 1328   HGBUR NEGATIVE 10/19/2019 0146   BILIRUBINUR  NEGATIVE 10/19/2019 0146   BILIRUBINUR 1+ 12/03/2013 1328   KETONESUR NEGATIVE 10/19/2019 0146   PROTEINUR 100 (A) 10/19/2019 0146   NITRITE NEGATIVE 10/19/2019 0146   LEUKOCYTESUR LARGE (A) 10/19/2019 0146   LEUKOCYTESUR Negative 12/03/2013 1328   Sepsis Labs Invalid input(s): PROCALCITONIN,  WBC,  LACTICIDVEN Microbiology Recent Results (from the past 240 hour(s))  SARS Coronavirus 2 by RT PCR (hospital order, performed in [place] hospital lab) Nasopharyngeal Nasopharyngeal Swab     Status: None   Collection Time: 10/19/19  1:46 AM   Specimen: Nasopharyngeal Swab  Result Value Ref Range Status   SARS Coronavirus 2 NEGATIVE NEGATIVE Final    Comment: (NOTE) SARS-CoV-2 target nucleic acids are NOT DETECTED.  The SARS-CoV-2 RNA is generally detectable in upper and lower respiratory specimens during the acute phase of infection. The lowest concentration of SARS-CoV-2 viral copies this assay can detect is 250 copies / mL. A negative result does not preclude SARS-CoV-2 infection and should not be used as the sole basis for treatment or other patient management decisions.  A negative result may occur with improper specimen collection / handling, submission of specimen other than nasopharyngeal swab, presence of viral mutation(s) within the areas targeted by this assay, and inadequate number of viral copies (<250 copies / mL). A negative result must be combined with clinical observations, patient history, and epidemiological information.  Fact Sheet for Patients:   StrictlyIdeas.no  Fact Sheet for Healthcare Providers: BankingDealers.co.za  This test is not yet approved or  cleared by the [place] FDA and has been authorized for detection and/or diagnosis of SARS-CoV-2 by FDA under an Emergency Use Authorization (EUA).  This EUA will remain in effect (meaning this test can be used) for the duration of the COVID-19 declaration  under Section 564(b)(1) of the Act, 21 U.S.C. section 360bbb-3(b)(1), unless the authorization is terminated or revoked sooner.  Performed at [facility], [address], [place], [place] [address]   Urine culture     Status: Abnormal   Collection Time: 10/19/19  1:46 AM   Specimen: Urine, Clean Catch  Result Value Ref Range Status   Specimen Description   Final    URINE, CLEAN CATCH Performed at [facility], [address], [place], [place] [address]    Special Requests   Final    NONE Performed at [facility], [address] [place], [place] [address]    Culture >=100,000 COLONIES/mL KLEBSIELLA PNEUMONIAE (A)  Final   Report Status 10/21/2019 FINAL  Final   Organism ID, Bacteria KLEBSIELLA PNEUMONIAE (A)  Final      Susceptibility   Klebsiella pneumoniae - MIC*    AMPICILLIN >=32 RESISTANT Resistant     CEFAZOLIN <=4 SENSITIVE Sensitive  CEFTRIAXONE <=0.25 SENSITIVE Sensitive     CIPROFLOXACIN <=0.25 SENSITIVE Sensitive     GENTAMICIN <=1 SENSITIVE Sensitive     IMIPENEM <=0.25 SENSITIVE Sensitive     NITROFURANTOIN 64 INTERMEDIATE Intermediate     TRIMETH/SULFA <=20 SENSITIVE Sensitive     AMPICILLIN/SULBACTAM >=32 RESISTANT Resistant     PIP/TAZO <=4 SENSITIVE Sensitive     * >=100,000 COLONIES/mL KLEBSIELLA PNEUMONIAE  Culture, blood (x 2)     Status: None (Preliminary result)   Collection Time: 10/19/19  4:08 AM   Specimen: BLOOD  Result Value Ref Range Status   Specimen Description BLOOD LAC  Final   Special Requests BOTTLES DRAWN AEROBIC AND ANAEROBIC [place]  Final   Culture   Final    NO GROWTH 2 DAYS Performed at [facility], [address] [place], [place] [address]    Report Status PENDING  Incomplete  Culture, blood (x 2)     Status: None (Preliminary result)   Collection Time: 10/19/19  4:09 AM   Specimen: BLOOD  Result Value Ref Range Status   Specimen Description BLOOD R ARM  Final   Special Requests  BOTTLES DRAWN AEROBIC AND ANAEROBIC [place]  Final   Culture   Final    NO GROWTH 2 DAYS Performed at [facility], [address], [place], [place] [address]    Report Status PENDING  Incomplete  C Difficile Quick Screen w PCR reflex     Status: None   Collection Time: 10/19/19  8:18 AM   Specimen: STOOL  Result Value Ref Range Status   C Diff antigen NEGATIVE NEGATIVE Final   C Diff toxin NEGATIVE NEGATIVE Final   C Diff interpretation No C. difficile detected.  Final    Comment: Performed at [facility], [place] [address] [place], [place] [address]  MRSA PCR Screening     Status: Abnormal   Collection Time: 10/19/19  8:13 PM   Specimen: Nasopharyngeal  Result Value Ref Range Status   MRSA by PCR (A) NEGATIVE Final    INVALID, UNABLE TO DETERMINE THE PRESENCE OF TARGET DUE TO SPECIMEN INTEGRITY. RECOLLECTION REQUESTED.    Comment: RESULT CALLED TO, READ BACK BY AND VERIFIED WITH: Salomon Mast RN [phone] 10/20/19 HNM Performed at [facility], [address] [place], [place] [address]   MRSA PCR Screening     Status: None   Collection Time: 10/20/19  6:36 AM   Specimen: Nasal Mucosa; Nasopharyngeal  Result Value Ref Range Status   MRSA by PCR NEGATIVE NEGATIVE Final    Comment:        The GeneXpert MRSA Assay (FDA approved for NASAL specimens only), is one component of a comprehensive MRSA colonization surveillance program. It is not intended to diagnose MRSA infection nor to guide or monitor treatment for MRSA infections. Performed at [facility], [address] [place],  [address]      Total time spend on discharging this patient, including the last patient exam, discussing the hospital stay, instructions for ongoing care as it relates to all pertinent caregivers, as well as preparing the medical discharge records, prescriptions, and/or referrals as applicable, is 45 minutes.    Enzo Bi, MD  [facility] 10/21/2019,  12:03 PM  If 7PM-7AM, please contact night-coverage

## 2019-10-21 NOTE — TOC Transition Note (Signed)
Transition of Care [facility]) - CM/SW Discharge Note   Patient Details  Name: Billy Bennett. MRN: [id number] Date of Birth: 12/12/1923  [facility]) CM/SW Contact:  Candie Chroman, LCSW Phone Number: 10/21/2019, 2:25 PM   Clinical Narrative: Patient has orders to discharge back to [facility]F today. Nurse does not need to call report. EMS has been set up. Patient is 8th on the list. No further concerns. CSW signing off.    Final next level of care: Assisted Living Barriers to Discharge: Barriers Resolved   Patient Goals and CMS Choice Patient states their goals for this hospitalization and ongoing recovery are:: Patient only oriented to self.   Choice offered to / list presented to : NA  Discharge Placement                Patient to be transferred to facility by: EMS Name of family member notified: Cindie Laroche Patient and family notified of of transfer: 10/21/19  Discharge Plan and Services                                     Social Determinants of Health (SDOH) Interventions     Readmission Risk Interventions No flowsheet data found.

## 2019-10-21 NOTE — TOC Progression Note (Addendum)
Transition of Care (TOC) - Progression Note    Patient Details  Name: Billy Bennett. MRN: [id number] Date of Birth: 1924-03-16  Transition of Care [facility]) CM/SW Contact  Candie Chroman, LCSW Phone Number: 10/21/2019, 11:47 AM  Clinical Narrative:  CSW notified Clayton Bibles at [facility] of plan for discharge today. Will fax discharge paperwork when available.  12:49 pm: Faxed FL2 and discharge summary to Ms. Jerline Pain to review.  Expected Discharge Plan: Assisted Living Barriers to Discharge: Continued Medical Work up  Expected Discharge Plan and Services Expected Discharge Plan: Assisted Living       Living arrangements for the past 2 months: [place] Expected Discharge Date: 10/21/19                                     Social Determinants of Health (SDOH) Interventions    Readmission Risk Interventions No flowsheet data found.

## 2019-10-21 NOTE — Progress Notes (Signed)
PROGRESS NOTE    Billy Bennett.  ZOX:[id number] DOB: [birth year] DOA: 10/18/2019 PCP: Gayland Curry, MD    Assessment & Plan:   Active Problems:   UTI (urinary tract infection)   Billy Bennett  is a 86 y.o. Caucasian male with a known history of hypertension, dyslipidemia, GERD and BPH who presented to the emergency room with acute onset of intractable nausea and vomiting with associated diarrhea without abdominal pain over the last 24 hours.  He admits to urinary frequency and urgency without dysuria or hematuria or flank pain he denied any abdominal pain.  His last Covid vaccine was a long time ago.  No chest pain or dyspnea or palpitations or cough or wheezing or hemoptysis.  No bilious vomitus or hematemesis.    1.  Intractable nausea and vomiting with diarrhea concerning for acute gastroenteritis. --stool study neg -continue hydration with IV normal saline.  2.  Acute lower UTI  -urinary frequency was the main symptom.  Started on ceftriaxone on presentation. --continue ceftriaxone pending urine cx result  3.  Mild acute kidney injury. -This is likely prerenal from volume depletion and dehydration due to #1. -We will hydrate with IV normal saline and follow BMP.  4.  Hypertension. -We will continue hydralazine and hold off HCTZ.  5.  BPH. -Flomax will be continued.  6.  Depression. -Zoloft will be resumed.  7.  Dyslipidemia. -Statin therapy will be resumed.   DVT prophylaxis: Lovenox SQ Code Status: DNR  Family Communication:  Status is: inpatient Dispo:   The patient is from: home Anticipated d/c is to: home Anticipated d/c date is: 1-2 days Patient currently is not medically stable to d/c due to: AKI on IV fluids, UTI on IV abx pending urine cx   Subjective and Interval History:  Pt reported no more N/V, and was eating fine.  No diarrhea.  His main complaint was having to void frequently.  No dysuria.  No fever, dyspnea, chest pain, abdominal  pain.    Objective: Vitals:   10/19/19 1954 10/20/19 0041 10/20/19 0532 10/20/19 1500  BP: (!) 158/92 (!) 143/55 (!) 165/79 (!) 156/71  Pulse: 77 78 84   Resp: (!) 22 20 20 16   Temp: 98 F (36.7 C) 98.2 F (36.8 C) 98 F (36.7 C) 97.6 F (36.4 C)  TempSrc: Oral Oral Oral Oral  SpO2: 98% 95% 100% 97%  Weight:      Height:        Intake/Output Summary (Last 24 hours) at 10/21/2019 0110 Last data filed at 10/20/2019 1700 Gross per 24 hour  Intake 1697.84 ml  Output 1775 ml  Net -77.16 ml   Filed Weights   10/18/19 2248  Weight: 64.9 kg    Examination:   Constitutional: NAD, alert, oriented to person HEENT: conjunctivae and lids normal, EOMI CV: RRR no M,R,G. Distal pulses +2.  No cyanosis.   RESP: CTA B/L, normal respiratory effort  GI: +BS, NTND Extremities: No effusions, edema, or tenderness in BLE SKIN: warm, dry and intact Neuro: II - XII grossly intact.  Sensation intact    Data Reviewed: I have personally reviewed following labs and imaging studies  CBC: Recent Labs  Lab 10/18/19 2303  WBC 14.2*  NEUTROABS 12.4*  HGB 12.1*  HCT 35.8*  MCV 86.3  PLT 811   Basic Metabolic Panel: Recent Labs  Lab 10/18/19 2303 10/19/19 0451  NA 140 140  K 3.7 3.7  CL 104 105  CO2 24 24  GLUCOSE  125* 114*  BUN 32* 28*  CREATININE 1.39* 1.08  CALCIUM 8.9 8.5*   GFR: Estimated Creatinine Clearance: 38.6 mL/min (by C-G formula based on SCr of 1.08 mg/dL). Liver Function Tests: Recent Labs  Lab 10/18/19 2303  AST 20  ALT 14  ALKPHOS 72  BILITOT 0.6  PROT 7.3  ALBUMIN 3.8   Recent Labs  Lab 10/18/19 2303  LIPASE 28   No results for input(s): AMMONIA in the last 168 hours. Coagulation Profile: Recent Labs  Lab 10/19/19 0408  INR 1.0   Cardiac Enzymes: No results for input(s): CKTOTAL, CKMB, CKMBINDEX, TROPONINI in the last 168 hours. BNP (last 3 results) No results for input(s): PROBNP in the last 8760 hours. HbA1C: No results for input(s):  HGBA1C in the last 72 hours. CBG: No results for input(s): GLUCAP in the last 168 hours. Lipid Profile: No results for input(s): CHOL, HDL, LDLCALC, TRIG, CHOLHDL, LDLDIRECT in the last 72 hours. Thyroid Function Tests: No results for input(s): TSH, T4TOTAL, FREET4, T3FREE, THYROIDAB in the last 72 hours. Anemia Panel: No results for input(s): VITAMINB12, FOLATE, FERRITIN, TIBC, IRON, RETICCTPCT in the last 72 hours. Sepsis Labs: Recent Labs  Lab 10/18/19 2346 10/19/19 0408 10/19/19 0451  LATICACIDVEN 0.8 0.7 0.7    Recent Results (from the past 240 hour(s))  SARS Coronavirus 2 by RT PCR (hospital order, performed in [facility] hospital lab) Nasopharyngeal Nasopharyngeal Swab     Status: None   Collection Time: 10/19/19  1:46 AM   Specimen: Nasopharyngeal Swab  Result Value Ref Range Status   SARS Coronavirus 2 NEGATIVE NEGATIVE Final    Comment: (NOTE) SARS-CoV-2 target nucleic acids are NOT DETECTED.  The SARS-CoV-2 RNA is generally detectable in upper and lower respiratory specimens during the acute phase of infection. The lowest concentration of SARS-CoV-2 viral copies this assay can detect is 250 copies / mL. A negative result does not preclude SARS-CoV-2 infection and should not be used as the sole basis for treatment or other patient management decisions.  A negative result may occur with improper specimen collection / handling, submission of specimen other than nasopharyngeal swab, presence of viral mutation(s) within the areas targeted by this assay, and inadequate number of viral copies (<250 copies / mL). A negative result must be combined with clinical observations, patient history, and epidemiological information.  Fact Sheet for Patients:   StrictlyIdeas.no  Fact Sheet for Healthcare Providers: BankingDealers.co.za  This test is not yet approved or  cleared by the [place] FDA and has been authorized for  detection and/or diagnosis of SARS-CoV-2 by FDA under an Emergency Use Authorization (EUA).  This EUA will remain in effect (meaning this test can be used) for the duration of the COVID-19 declaration under Section 564(b)(1) of the Act, 21 U.S.C. section 360bbb-3(b)(1), unless the authorization is terminated or revoked sooner.  Performed at [facility], [address] [place], [place] [address]   Urine culture     Status: Abnormal (Preliminary result)   Collection Time: 10/19/19  1:46 AM   Specimen: Urine, Clean Catch  Result Value Ref Range Status   Specimen Description   Final    URINE, CLEAN CATCH Performed at [facility], [address], [place], [place] [address]    Special Requests   Final    NONE Performed at [facility], [address] [place], [place] [address]    Culture (A)  Final    >=100,000 COLONIES/mL KLEBSIELLA PNEUMONIAE SUSCEPTIBILITIES TO FOLLOW Performed at  [facility], [place] [address], [place], [place] [address]    Report Status PENDING  Incomplete  Culture, blood (x 2)     Status: None (Preliminary result)   Collection Time: 10/19/19  4:08 AM   Specimen: BLOOD  Result Value Ref Range Status   Specimen Description BLOOD LAC  Final   Special Requests BOTTLES DRAWN AEROBIC AND ANAEROBIC [place]  Final   Culture   Final    NO GROWTH 1 DAY Performed at [facility], [address] [place], [place] [address]    Report Status PENDING  Incomplete  Culture, blood (x 2)     Status: None (Preliminary result)   Collection Time: 10/19/19  4:09 AM   Specimen: BLOOD  Result Value Ref Range Status   Specimen Description BLOOD R ARM  Final   Special Requests BOTTLES DRAWN AEROBIC AND ANAEROBIC [place]  Final   Culture   Final    NO GROWTH 1 DAY Performed at [facility], [address], [place], [place] [address]    Report Status PENDING  Incomplete  C Difficile Quick Screen w PCR reflex     Status: None    Collection Time: 10/19/19  8:18 AM   Specimen: STOOL  Result Value Ref Range Status   C Diff antigen NEGATIVE NEGATIVE Final   C Diff toxin NEGATIVE NEGATIVE Final   C Diff interpretation No C. difficile detected.  Final    Comment: Performed at [facility], [address] [place], [place] [address]  MRSA PCR Screening     Status: Abnormal   Collection Time: 10/19/19  8:13 PM   Specimen: Nasopharyngeal  Result Value Ref Range Status   MRSA by PCR (A) NEGATIVE Final    INVALID, UNABLE TO DETERMINE THE PRESENCE OF TARGET DUE TO SPECIMEN INTEGRITY. RECOLLECTION REQUESTED.    Comment: RESULT CALLED TO, READ BACK BY AND VERIFIED WITH: Salomon Mast RN [phone] 10/20/19 HNM Performed at [facility], [address] [place], [place] [address]   MRSA PCR Screening     Status: None   Collection Time: 10/20/19  6:36 AM   Specimen: Nasal Mucosa; Nasopharyngeal  Result Value Ref Range Status   MRSA by PCR NEGATIVE NEGATIVE Final    Comment:        The GeneXpert MRSA Assay (FDA approved for NASAL specimens only), is one component of a comprehensive MRSA colonization surveillance program. It is not intended to diagnose MRSA infection nor to guide or monitor treatment for MRSA infections. Performed at [facility], [address], [place], [place] [address]       Radiology Studies: CT Head Wo Contrast  Result Date: 10/19/2019 CLINICAL DATA:  Headaches and nausea EXAM: CT HEAD WITHOUT CONTRAST TECHNIQUE: Contiguous axial images were obtained from the base of the skull through the vertex without intravenous contrast. COMPARISON:  10/03/2019 FINDINGS: Brain: Chronic atrophic and white matter ischemic changes are again noted and stable. No acute hemorrhage or acute infarct is seen. Vascular: No hyperdense vessel or unexpected calcification. Skull: Normal. Negative for fracture or focal lesion. Sinuses/Orbits: Chronic expansile opacification of the right maxillary antrum  is noted consistent with mucosal retention cyst. This is stable in appearance. Similar changes are noted in the right ethmoid sinuses also stable in appearance. Other: None. IMPRESSION: Chronic atrophic and ischemic changes. Chronic sinus disease on the right in the ethmoid and maxillary antra. No acute abnormality noted. Electronically Signed   By: Linus Mako.D.  On: 10/19/2019 01:30   CT ABDOMEN PELVIS W CONTRAST  Result Date: 10/19/2019 CLINICAL DATA:  Nausea and vomiting following dinner EXAM: CT ABDOMEN AND PELVIS WITH CONTRAST TECHNIQUE: Multidetector CT imaging of the abdomen and pelvis was performed using the standard protocol following bolus administration of intravenous contrast. CONTRAST:  24mL OMNIPAQUE IOHEXOL 300 MG/ML  SOLN COMPARISON:  None. FINDINGS: Lower chest: No acute abnormality. Hepatobiliary: No focal liver abnormality is seen. Status post cholecystectomy. No biliary dilatation. Pancreas: Unremarkable. No pancreatic ductal dilatation or surrounding inflammatory changes. Spleen: Normal in size without focal abnormality. Adrenals/Urinary Tract: Adrenal glands are within normal limits. Kidneys demonstrate a normal enhancement pattern bilaterally. Renal cysts are noted on the left. No renal calculi or obstructive changes are seen. Normal excretion of contrast is noted bilaterally. The bladder is well distended. Stomach/Bowel: Scattered diverticular change of the colon is noted without evidence of diverticulitis. No obstructive or inflammatory changes of the colon are noted. The appendix is within normal limits. No gastric or small bowel abnormality is noted. 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 The maximum aortic dimension is 3 cm and transverse diameter. No significant lymphadenopathy is noted. Reproductive: Prostate is unremarkable. Other: No abdominal wall hernia or abnormality. No abdominopelvic ascites. Musculoskeletal:  Degenerative changes of lumbar spine are noted. IMPRESSION: Diverticulosis without diverticulitis. Chronic changes as described above without acute abnormality. Electronically Signed   By: Inez Catalina M.D.   On: 10/19/2019 01:28     Scheduled Meds: . amLODipine  10 mg Oral Daily  . donepezil  5 mg Oral Daily  . enoxaparin (LOVENOX) injection  40 mg Subcutaneous Q24H  . finasteride  5 mg Oral Daily  . hydrALAZINE  25 mg Oral TID  . pantoprazole  40 mg Oral Daily  . pravastatin  20 mg Oral QHS  . sertraline  25 mg Oral Daily  . tamsulosin  0.4 mg Oral Daily   Continuous Infusions: . sodium chloride 100 mL/hr at 10/20/19 2306  . cefTRIAXone (ROCEPHIN)  IV 1 g (10/20/19 2059)     LOS: 2 days     Enzo Bi, MD [facility] If 7PM-7AM, please contact night-coverage 10/21/2019, 1:10 AM

## 2019-10-21 NOTE — NC FL2 (Signed)
[place] LEVEL OF CARE SCREENING TOOL     IDENTIFICATION  Patient Name: Billy Bennett. Birthdate: Jul 02, 1923 Sex: male Admission Date (Current Location): 10/18/2019  [place] and [place] Number:  Engineering geologist and Address:  [facility], [address] [place], [place] [address]      Provider Number: [id number]  Attending Physician Name and Address:  Enzo Bi, MD  Relative Name and Phone Number:       Current Level of Care: Hospital Recommended Level of Care: [place] Prior Approval Number:    Date Approved/Denied:   PASRR Number:    Discharge Plan: Other (Comment) (ALF)    Current Diagnoses: Patient Active Problem List   Diagnosis Date Noted  . UTI (urinary tract infection) 10/19/2019  . Hemorrhagic stroke ([place])   . Traumatic rhabdomyolysis ([place])   . Gastroesophageal reflux disease without esophagitis   . Head injury   . Acute metabolic encephalopathy 21/30/8657  . Hypokalemia 02/27/2019  . Elevated troponin 02/27/2019  . Fall   . At high risk for falls 08/28/2017  . Fecal smearing 07/01/2017  . Other constipation 07/01/2017  . Heart murmur, systolic 84/69/6295  . Skin cancer 08/22/2016  . Status post arthroscopy 07/16/2016  . Pure hypercholesterolemia 04/01/2011  . Hypertension 01/20/2011    Orientation RESPIRATION BLADDER Height & Weight     Self  Normal Incontinent Weight: 143 lb (64.9 kg) Height:  5\' 6"  (167.6 cm)  BEHAVIORAL SYMPTOMS/MOOD NEUROLOGICAL BOWEL NUTRITION STATUS   (None)  (None) Incontinent Diet (Heart healthy)  AMBULATORY STATUS COMMUNICATION OF NEEDS Skin     Verbally Other (Comment) (MASD, Skin tear.)                       Personal Care Assistance Level of Assistance              Functional Limitations Info  Sight, Hearing, Speech Sight Info: Adequate Hearing Info: Adequate Speech Info: Adequate    SPECIAL CARE FACTORS FREQUENCY                        Contractures Contractures Info: Not present    Additional Factors Info  Code Status, Allergies Code Status Info: DNR Allergies Info: Ace Inhibitors, Telmisartan-hctz, Amoxicillin-pot Clavulanate, Ibuprofen           Current Medications (10/21/2019):  This is the current hospital active medication list Current Facility-Administered Medications  Medication Dose Route Frequency Provider Last Rate Last Admin  . acetaminophen (TYLENOL) tablet 650 mg  650 mg Oral Q6H PRN Mansy, Jan A, MD   650 mg at 10/19/19 1454   Or  . acetaminophen (TYLENOL) suppository 650 mg  650 mg Rectal Q6H PRN Mansy, Jan A, MD      . amLODipine (NORVASC) tablet 10 mg  10 mg Oral Daily Mansy, Jan A, MD   10 mg at 10/21/19 0935  . cephALEXin (KEFLEX) capsule 500 mg  500 mg Oral Q12H Enzo Bi, MD      . donepezil (ARICEPT) tablet 5 mg  5 mg Oral Daily Mansy, Jan A, MD   5 mg at 10/21/19 0940  . enoxaparin (LOVENOX) injection 40 mg  40 mg Subcutaneous Q24H Mansy, Jan A, MD   40 mg at 10/21/19 0934  . finasteride (PROSCAR) tablet 5 mg  5 mg Oral Daily Mansy, Jan A, MD   5 mg at 10/21/19 0939  . hydrALAZINE (APRESOLINE) tablet 50  mg  50 mg Oral TID Enzo Bi, MD   50 mg at 10/21/19 7939  . labetalol (NORMODYNE) injection 20 mg  20 mg Intravenous Q3H PRN Mansy, Jan A, MD      . magnesium hydroxide (MILK OF MAGNESIA) suspension 30 mL  30 mL Oral Daily PRN Mansy, Jan A, MD      . ondansetron [facility]) tablet 4 mg  4 mg Oral Q6H PRN Mansy, Jan A, MD       Or  . ondansetron [facility]) injection 4 mg  4 mg Intravenous Q6H PRN Mansy, Jan A, MD      . pantoprazole (PROTONIX) EC tablet 40 mg  40 mg Oral Daily Mansy, Jan A, MD   40 mg at 10/21/19 0935  . potassium chloride (KLOR-CON) 10 MEQ CR tablet           . pravastatin (PRAVACHOL) tablet 20 mg  20 mg Oral QHS Mansy, Jan A, MD   20 mg at 10/20/19 2057  . sertraline (ZOLOFT) tablet 25 mg  25 mg Oral Daily Mansy, Jan A, MD   25 mg at 10/21/19 1003  . tamsulosin (FLOMAX)  capsule 0.4 mg  0.4 mg Oral Daily Mansy, Jan A, MD   0.4 mg at 10/21/19 0300  . traZODone (DESYREL) tablet 25 mg  25 mg Oral QHS PRN Mansy, Jan A, MD   25 mg at 10/20/19 0020     Discharge Medications: TAKE these medications   acetaminophen 500 MG tablet Commonly known as: TYLENOL Take 1,000 mg by mouth in the morning, at noon, and at bedtime.   amLODipine 10 MG tablet Commonly known as: NORVASC Take 1 tablet (10 mg total) by mouth daily.   cephALEXin 500 MG capsule Commonly known as: KEFLEX Take 1 capsule (500 mg total) by mouth every 12 (twelve) hours for 4 days.   donepezil 5 MG tablet Commonly known as: ARICEPT Take 5 mg by mouth daily.   ferrous sulfate 325 (65 FE) MG tablet Take 325 mg by mouth daily with breakfast.   finasteride 5 MG tablet Commonly known as: PROSCAR Take 5 mg by mouth daily.   gabapentin 100 MG capsule Commonly known as: NEURONTIN Take 100 mg by mouth at bedtime.   hydrALAZINE 25 MG tablet Commonly known as: APRESOLINE Take 1 tablet (25 mg total) by mouth 3 (three) times daily.   hydrochlorothiazide 12.5 MG capsule Commonly known as: MICROZIDE Take 1 capsule (12.5 mg total) by mouth daily.   omeprazole 20 MG capsule Commonly known as: PRILOSEC Take 20 mg by mouth 2 (two) times daily.   pravastatin 20 MG tablet Commonly known as: PRAVACHOL Take 20 mg by mouth at bedtime.   sertraline 25 MG tablet Commonly known as: ZOLOFT Take 25 mg by mouth daily.   tamsulosin 0.4 MG Caps capsule Commonly known as: FLOMAX Take 0.4 mg by mouth.   traMADol 50 MG tablet Commonly known as: ULTRAM Take 50 mg by mouth in the morning, at noon, and at bedtime.     Relevant Imaging Results:  Relevant Lab Results:   Additional Information SS#: [id number]  Candie Chroman, LCSW

## 2019-10-21 NOTE — Progress Notes (Signed)
Billy Bennett. to be D/C'd Billy Bennett ASL per MD order.  Discussed prescriptions and follow up appointments with the patient. Prescriptions given to patient, medication list explained in detail. Pt verbalized understanding.  Allergies as of 10/21/2019      Reactions   Ace Inhibitors Cough   Telmisartan-hctz Cough   Other Reaction: ARBS - COUGH   Amoxicillin-pot Clavulanate Diarrhea   Has patient had a PCN reaction causing immediate rash, facial/tongue/throat swelling, SOB or lightheadedness with hypotension:unsure Has patient had a PCN reaction causing severe rash involving mucus membranes or skin necrosis:unsure Has patient had a PCN reaction that required hospitalization:unsure  Has patient had a PCN reaction occurring within the last 10 years:Yes If all of the above answers are "NO", then may proceed with Cephalosporin use.   Ibuprofen Other (See Comments)   Ankle swelling ( can take naprosyn)      Medication List    TAKE these medications   acetaminophen 500 MG tablet Commonly known as: TYLENOL Take 1,000 mg by mouth in the morning, at noon, and at bedtime.   amLODipine 10 MG tablet Commonly known as: NORVASC Take 1 tablet (10 mg total) by mouth daily.   cephALEXin 500 MG capsule Commonly known as: KEFLEX Take 1 capsule (500 mg total) by mouth every 12 (twelve) hours for 4 days.   donepezil 5 MG tablet Commonly known as: ARICEPT Take 5 mg by mouth daily.   ferrous sulfate 325 (65 FE) MG tablet Take 325 mg by mouth daily with breakfast.   finasteride 5 MG tablet Commonly known as: PROSCAR Take 5 mg by mouth daily.   gabapentin 100 MG capsule Commonly known as: NEURONTIN Take 100 mg by mouth at bedtime.   hydrALAZINE 25 MG tablet Commonly known as: APRESOLINE Take 1 tablet (25 mg total) by mouth 3 (three) times daily.   hydrochlorothiazide 12.5 MG capsule Commonly known as: MICROZIDE Take 1 capsule (12.5 mg total) by mouth daily.   omeprazole 20 MG  capsule Commonly known as: PRILOSEC Take 20 mg by mouth 2 (two) times daily.   pravastatin 20 MG tablet Commonly known as: PRAVACHOL Take 20 mg by mouth at bedtime.   sertraline 25 MG tablet Commonly known as: ZOLOFT Take 25 mg by mouth daily.   tamsulosin 0.4 MG Caps capsule Commonly known as: FLOMAX Take 0.4 mg by mouth.   traMADol 50 MG tablet Commonly known as: ULTRAM Take 50 mg by mouth in the morning, at noon, and at bedtime.       Vitals:   10/21/19 0900 10/21/19 1159  BP: (!) 155/62 133/73  Pulse: 71 65  Resp: 20 15  Temp: 97.8 F (36.6 C) 98 F (36.7 C)  SpO2:  97%    Skin clean, dry and intact without evidence of skin break down, no evidence of skin tears noted. IV catheter discontinued intact. Site without signs and symptoms of complications. Dressing and pressure applied. Pt denies pain at this time. No complaints noted.  An After Visit Summary was printed and given to the patient. Patient escorted via stretcher, and D/C to [facility] via EMS.  Fuller Mandril, RN

## 2019-10-24 LAB — CULTURE, BLOOD (ROUTINE X 2)
Culture: NO GROWTH
Culture: NO GROWTH

## 2019-10-24 LAB — STOOL CULTURE: E coli, Shiga toxin Assay: NEGATIVE

## 2019-10-24 LAB — STOOL CULTURE REFLEX - CMPCXR

## 2019-10-24 LAB — STOOL CULTURE REFLEX - RSASHR

## 2019-11-15 ENCOUNTER — Other Ambulatory Visit: Payer: Self-pay

## 2019-11-15 ENCOUNTER — Emergency Department: Payer: Medicare Other

## 2019-11-15 ENCOUNTER — Emergency Department
Admission: EM | Admit: 2019-11-15 | Discharge: 2019-11-16 | Disposition: A | Discharge status: Skilled Nursing Facility | Payer: Medicare Other | Attending: Emergency Medicine | Admitting: Emergency Medicine

## 2019-11-15 DIAGNOSIS — Y999 Unspecified external cause status: Secondary | ICD-10-CM | POA: Diagnosis not present

## 2019-11-15 DIAGNOSIS — S0990XA Unspecified injury of head, initial encounter: Secondary | ICD-10-CM | POA: Diagnosis present

## 2019-11-15 DIAGNOSIS — Y929 Unspecified place or not applicable: Secondary | ICD-10-CM | POA: Diagnosis not present

## 2019-11-15 DIAGNOSIS — Y939 Activity, unspecified: Secondary | ICD-10-CM | POA: Diagnosis not present

## 2019-11-15 DIAGNOSIS — Z79899 Other long term (current) drug therapy: Secondary | ICD-10-CM | POA: Diagnosis not present

## 2019-11-15 DIAGNOSIS — I10 Essential (primary) hypertension: Secondary | ICD-10-CM | POA: Insufficient documentation

## 2019-11-15 DIAGNOSIS — S0083XA Contusion of other part of head, initial encounter: Secondary | ICD-10-CM | POA: Diagnosis not present

## 2019-11-15 DIAGNOSIS — W1839XA Other fall on same level, initial encounter: Secondary | ICD-10-CM | POA: Insufficient documentation

## 2019-11-15 DIAGNOSIS — W19XXXA Unspecified fall, initial encounter: Secondary | ICD-10-CM

## 2019-11-15 DIAGNOSIS — Z87891 Personal history of nicotine dependence: Secondary | ICD-10-CM | POA: Diagnosis not present

## 2019-11-15 HISTORY — DX: Unspecified dementia, unspecified severity, without behavioral disturbance, psychotic disturbance, mood disturbance, and anxiety: F03.90

## 2019-11-15 HISTORY — DX: Do not resuscitate: Z66

## 2019-11-15 NOTE — ED Notes (Signed)
MD at bedside with PT

## 2019-11-15 NOTE — ED Notes (Signed)
Pt to CT

## 2019-11-15 NOTE — ED Provider Notes (Signed)
[facility] Emergency Department Provider Note  ____________________________________________   First MD Initiated Contact with Patient 11/15/19 2307     (approximate)  I have reviewed the triage vital signs and the nursing notes.   HISTORY  Chief Complaint Fall    HPI Billy Bennett. is a 86 y.o. male with medical history as listed below  which notably includes at least a degree of dementia at baseline.  He presents from his assisted living facility for evaluation after a fall.  Reportedly he had an unwitnessed fall at the facility.  The patient agrees that he fell and said that he is "been following for years and have bumped my head more times than I can remember".  He said he is in no pain at this time.  He denies headache, visual changes, neck pain, chest pain, shortness of breath, nausea, vomiting, abdominal pain, and dysuria.  He has no pain in his arms nor his legs.  He said he feels fine and just stumbled and fell and said this happens to him a lot.  He does not believe he lost consciousness.  The episode happened acutely, was in his opinion minor, and nothing in particular made it better or worse.        Past Medical History:  Diagnosis Date  . Anxiety   . Arthritis   . Dementia ([place])   . DNR no code (do not resuscitate)   . Enlarged prostate   . GERD (gastroesophageal reflux disease)   . Heart murmur, systolic 18/29/9371  . HOH (hard of hearing)   . Hypercholesteremia   . Hypertension   . Other constipation 07/01/2017  . Pure hypercholesterolemia 04/01/2011  . Skin cancer 08/22/2016   Overview:  Patient sees Dr. Phillip Heal    Patient Active Problem List   Diagnosis Date Noted  . UTI (urinary tract infection) 10/19/2019  . Hemorrhagic stroke ([place])   . Traumatic rhabdomyolysis ([place])   . Gastroesophageal reflux disease without esophagitis   . Head injury   . Acute metabolic encephalopathy 69/67/8938  . Hypokalemia 02/27/2019  . Elevated  troponin 02/27/2019  . Fall   . At high risk for falls 08/28/2017  . Fecal smearing 07/01/2017  . Other constipation 07/01/2017  . Heart murmur, systolic 01/29/5101  . Skin cancer 08/22/2016  . Status post arthroscopy 07/16/2016  . Pure hypercholesterolemia 04/01/2011  . Hypertension 01/20/2011    Past Surgical History:  Procedure Laterality Date  . ANAL FISSURE REPAIR    . CHOLECYSTECTOMY    . EYE SURGERY Bilateral    Cataract Extraction with IOL  . KNEE ARTHROSCOPY Right 07/08/2016   Procedure: ARTHROSCOPY KNEE, PARTIAL MEDIAL MENISECTOMY, CHONDROPLASTY;  Surgeon: Dereck Leep, MD;  Location: [facility] ORS;  Service: Orthopedics;  Laterality: Right;    Prior to Admission medications   Medication Sig Start Date End Date Taking? Authorizing Provider  acetaminophen (TYLENOL) 500 MG tablet Take 1,000 mg by mouth in the morning, at noon, and at bedtime.    [provider]  amLODipine (NORVASC) 10 MG tablet Take 1 tablet (10 mg total) by mouth daily. 03/04/19   Fritzi Mandes, MD  donepezil (ARICEPT) 5 MG tablet Take 5 mg by mouth daily. 01/25/19   [provider]  ferrous sulfate 325 (65 FE) MG tablet Take 325 mg by mouth daily with breakfast.    [provider]  finasteride (PROSCAR) 5 MG tablet Take 5 mg by mouth daily.    [provider]  gabapentin (NEURONTIN) 100 MG capsule Take 100 mg by mouth at bedtime.    [provider]  hydrALAZINE (APRESOLINE) 25 MG tablet Take 1 tablet (25 mg total) by mouth 3 (three) times daily. 03/03/19   Fritzi Mandes, MD  hydrochlorothiazide (MICROZIDE) 12.5 MG capsule Take 1 capsule (12.5 mg total) by mouth daily. 03/03/19   Fritzi Mandes, MD  omeprazole (PRILOSEC) 20 MG capsule Take 20 mg by mouth 2 (two) times daily. 10/02/18 10/19/19  [provider]  pravastatin (PRAVACHOL) 20 MG tablet Take 20 mg by mouth at bedtime.  09/30/15   [provider]  sertraline (ZOLOFT) 25 MG tablet Take 25 mg by mouth  daily. 01/25/19   [provider]  tamsulosin (FLOMAX) 0.4 MG CAPS capsule Take 0.4 mg by mouth.    [provider]  traMADol (ULTRAM) 50 MG tablet Take 50 mg by mouth in the morning, at noon, and at bedtime.    [provider]    Allergies Ace inhibitors, Telmisartan-hctz, Amoxicillin-pot clavulanate, and Ibuprofen  Family History  Problem Relation Age of Onset  . Alzheimer's disease Father   . Prostate cancer Brother     Social History Social History   Tobacco Use  . Smoking status: Former Smoker    Types: Cigarettes    Quit date: 04/14/1977    Years since quitting: 42.6  . Smokeless tobacco: Never Used  Vaping Use  . Vaping Use: Never used  Substance Use Topics  . Alcohol use: No  . Drug use: No    Review of Systems Constitutional: No fever/chills Eyes: No visual changes. ENT: No sore throat. Cardiovascular: Denies chest pain. Respiratory: Denies shortness of breath. Gastrointestinal: No abdominal pain.  No nausea, no vomiting.  No diarrhea.  No constipation. Genitourinary: Negative for dysuria. Musculoskeletal: Minor head trauma.  Negative for neck pain.  Negative for back pain. Integumentary: Negative for rash. Neurological: Negative for headaches, focal weakness or numbness.   ____________________________________________   PHYSICAL EXAM:  VITAL SIGNS: ED Triage Vitals  Enc Vitals Group     BP 11/15/19 2305 (!) 165/73     Pulse Rate 11/15/19 2305 70     Resp 11/15/19 2305 16     Temp 11/15/19 2305 98.5 F (36.9 C)     Temp Source 11/15/19 2305 Oral     SpO2 11/15/19 2305 97 %     Weight 11/15/19 2304 72.6 kg (160 lb)     Height 11/15/19 2304 1.575 m (5\' 2" )     Head Circumference --      Peak Flow --      Pain Score --      Pain Loc --      Pain Edu? --      Excl. in [place]? --     Constitutional: Alert and oriented to person and location. Eyes: Conjunctivae are normal.  Head: Hematoma with mild abrasion to the right  side of his forehead.  No other obvious injuries. Nose: No congestion/rhinnorhea. Mouth/Throat: Patient is wearing a mask. Neck: No stridor.  No meningeal signs.   Cardiovascular: Normal rate, regular rhythm. Good peripheral circulation. Grossly normal heart sounds. Respiratory: Normal respiratory effort.  No retractions. Gastrointestinal: Soft and nontender. No distention.  Musculoskeletal: No lower extremity tenderness nor edema. No gross deformities of extremities.  The patient is able to actively move his arms and legs without any difficulty and I passively ranged the major joints of all 4 of his extremities without any difficulty. Neurologic:  Normal  speech and language. No gross focal neurologic deficits are appreciated.  Skin:  Skin is warm, dry and intact. Psychiatric: Mood and affect are normal. Speech and behavior are normal.  ____________________________________________   LABS (all labs ordered are listed, but only abnormal results are displayed)  Labs Reviewed - No data to display ____________________________________________  EKG  No indication for emergent EKG ____________________________________________  RADIOLOGY I, Hinda Kehr, personally viewed and evaluated these images (plain radiographs) as part of my medical decision making, as well as reviewing the written report by the radiologist.  ED MD interpretation:  No acute abnormalities identified  Official radiology report(s): CT Head Wo Contrast  Result Date: 11/15/2019 CLINICAL DATA:  Status post trauma. EXAM: CT HEAD WITHOUT CONTRAST TECHNIQUE: Contiguous axial images were obtained from the base of the skull through the vertex without intravenous contrast. COMPARISON:  October 19, 2019 FINDINGS: Brain: There is moderate severity cerebral atrophy with widening of the extra-axial spaces and ventricular dilatation. There are areas of decreased attenuation within the white matter tracts of the supratentorial brain,  consistent with microvascular disease changes. Vascular: No hyperdense vessel or unexpected calcification. Skull: Normal. Negative for fracture or focal lesion. Sinuses/Orbits: There is stable marked severity right maxillary sinus and right ethmoid sinus mucosal thickening. Other: Mild right frontal scalp soft tissue swelling is seen. IMPRESSION: 1. Mild right frontal scalp soft tissue swelling without evidence of acute fracture or acute intracranial abnormality. 2. Moderate severity cerebral atrophy and microvascular disease changes of the supratentorial brain. 3. Marked severity right maxillary sinus and right ethmoid sinus disease. Electronically Signed   By: Virgina Norfolk M.D.   On: 11/15/2019 23:55    ____________________________________________   PROCEDURES   Procedure(s) performed (including Critical Care):  Procedures   ____________________________________________   INITIAL IMPRESSION / MDM / [place] / ED COURSE  As part of my medical decision making, I reviewed the following data within the [place] notes reviewed and incorporated, Old chart reviewed and Notes from prior ED visits   Differential diagnosis includes, but is not limited to, mechanical fall, intracranial bleed, cervical spine injury, electrolyte or metabolic abnormality, acute infection.  The patient was admitted about a month ago for nausea, vomiting, and various other issues.  He was found to have a Klebsiella urinary tract infection at the time which was sensitive to Keflex.  He has had multiple CT scans over the last couple months for falls.  The main difference between now and last month is that he has no other symptoms tonight.  He is in good spirits, talkative and answering questions appropriately, and is oriented to himself and to the fact that he is in the hospital.  He is in no distress.  He is able to provide a good physical exam and history and he has no pain or  tenderness in his cervical spine.  Although it is theoretically possible he has a cervical spine injury as result of his fall, I am comfortable not getting an unnecessary CT cervical spine at this time.  Given the degree of hematoma to the right side of his forehead, as well as a history of intracranial bleeding although that one reportedly was nontraumatic, I will get a CT head to rule out acute injury.  I will attempt to touch base with his healthcare power of attorney to make sure that everyone is on the same page but the patient is already asking when he can go back and at this time I  do not think that any further medical work-up is indicated.     Clinical Course as of Nov 16 14  Tue Nov 16, 2019  0013 Head CT unremarkable with no evidence of acute injury.  Patient is remained stable including vital signs and clinically.  He has no evidence of an emergent injury and I do not feel that he has sufficient indication to warrant extensive work-up including blood work and urine, particularly given the lack of any other contributing symptoms and his relatively recent treatment with antibiotics.  I attempted to reach his healthcare power of attorney by phone but the phone went immediately to voicemail.  Rather than risk HIPAA noncompliance in case it was not the correct phone number (the voicemail did not identify the owner of the phone), I decided to use my clinical judgment and discharge the patient back to his facility as is his preference.  I provided my usual and customary follow-up recommendations and return precautions.   [CF]    Clinical Course User Index [CF] Hinda Kehr, MD     ____________________________________________  FINAL CLINICAL IMPRESSION(S) / ED DIAGNOSES  Final diagnoses:  Fall, initial encounter  Minor head injury, initial encounter  Forehead contusion, initial encounter     MEDICATIONS GIVEN DURING THIS VISIT:  Medications - No data to display   ED Discharge  Orders    None      *Please note:  Billy Bennett. was evaluated in Emergency Department on 11/16/2019 for the symptoms described in the history of present illness. He was evaluated in the context of the global COVID-19 pandemic, which necessitated consideration that the patient might be at risk for infection with the SARS-CoV-2 virus that causes COVID-19. Institutional protocols and algorithms that pertain to the evaluation of patients at risk for COVID-19 are in a state of rapid change based on information released by regulatory bodies including the CDC and federal and state organizations. These policies and algorithms were followed during the patient's care in the ED.  Some ED evaluations and interventions may be delayed as a result of limited staffing during and after the pandemic.*  Note:  This document was prepared using Dragon voice recognition software and may include unintentional dictation errors.   Hinda Kehr, MD 11/16/19 [id number]

## 2019-11-15 NOTE — ED Triage Notes (Signed)
Pt comes to ED via [facility] from [facility] where pt experienced an unwitnessed fall. Pt has a history of altered mental status at baseline per EMS. Pt arrives with a hematoma on the forehead.

## 2019-11-15 NOTE — ED Notes (Signed)
Pt cleaned of incontinence by this nurse, clean brief placed on this pt. Pt provided with warm blanket after assisted with cleaning of urine.

## 2019-11-15 NOTE — ED Notes (Signed)
Pt is able to tell name, DOB, and location, states he came to ED due to a fall. Unable to report year but states it is august. Pt placed in position of comfort and call bell  In reach. Pt states he wants to go home now.

## 2019-11-15 NOTE — ED Notes (Signed)
Pt ready for CT at this time

## 2019-11-16 NOTE — Discharge Instructions (Signed)
You have been seen in the Emergency Department (ED) today for a fall.  Your work up does not show any concerning injuries.  Please take over-the-counter ibuprofen and/or Tylenol as needed for your pain (unless you have an allergy or your doctor as told you not to take them), or take any prescribed medication as instructed.  You may use cold packs on the hematoma ("goose egg") on your forehead which should help the swelling go down.  Please follow up with your doctor regarding today's Emergency Department (ED) visit and your recent fall.    Return to the ED if you have any headache, confusion, slurred speech, weakness/numbness of any arm or leg, or any increased pain.

## 2019-11-16 NOTE — ED Notes (Signed)
Pt asleep in room at this time, lights are dimmed and call bell by right side

## 2019-11-16 NOTE — ED Notes (Signed)
Attempted to call [facility] for report, no answer

## 2020-05-31 ENCOUNTER — Other Ambulatory Visit: Payer: Self-pay

## 2020-05-31 ENCOUNTER — Emergency Department: Payer: Medicare Other

## 2020-05-31 ENCOUNTER — Emergency Department
Admission: EM | Admit: 2020-05-31 | Discharge: 2020-05-31 | Disposition: A | Discharge status: Home / Self Care | Payer: Medicare Other | Attending: Emergency Medicine | Admitting: Emergency Medicine

## 2020-05-31 DIAGNOSIS — Z87891 Personal history of nicotine dependence: Secondary | ICD-10-CM | POA: Diagnosis not present

## 2020-05-31 DIAGNOSIS — Z79899 Other long term (current) drug therapy: Secondary | ICD-10-CM | POA: Insufficient documentation

## 2020-05-31 DIAGNOSIS — I10 Essential (primary) hypertension: Secondary | ICD-10-CM | POA: Diagnosis not present

## 2020-05-31 DIAGNOSIS — W06XXXA Fall from bed, initial encounter: Secondary | ICD-10-CM | POA: Diagnosis not present

## 2020-05-31 DIAGNOSIS — Z85828 Personal history of other malignant neoplasm of skin: Secondary | ICD-10-CM | POA: Diagnosis not present

## 2020-05-31 DIAGNOSIS — M79662 Pain in left lower leg: Secondary | ICD-10-CM | POA: Diagnosis not present

## 2020-05-31 DIAGNOSIS — F0390 Unspecified dementia without behavioral disturbance: Secondary | ICD-10-CM | POA: Insufficient documentation

## 2020-05-31 DIAGNOSIS — W19XXXA Unspecified fall, initial encounter: Secondary | ICD-10-CM

## 2020-05-31 LAB — CBG MONITORING, ED: Glucose-Capillary: 92 mg/dL (ref 70–99)

## 2020-05-31 MED ORDER — ACETAMINOPHEN 500 MG PO TABS
1000.0000 mg | ORAL_TABLET | ORAL | Status: AC
  Administered 2020-05-31: 1000 mg via ORAL
  Filled 2020-05-31: qty 2

## 2020-05-31 NOTE — ED Notes (Signed)
This RN gave report to Rise Mu at [facility].

## 2020-05-31 NOTE — ED Provider Notes (Signed)
[facility] Emergency Department Provider Note ____________________________________________   Event Date/Time   First MD Initiated Contact with Patient 05/31/20 [phone]     (approximate)  I have reviewed the triage vital signs and the nursing notes.   HISTORY  Chief Complaint Fall  HPI Billy Bennett. is a 87 y.o. male is a history of dementia, hard of hearing hypertension previous UTIs encephalopathy  Patient presents today he evidently rolled out of bed fell on the floor at [facility].  He reports that he rolled out of bed and remembers falling on the floor and was hurting in his hands when he fell but that is gone away and now he just feels a little sore over his left shin.  No headache no neck pain no chest pain or trouble breathing.  No other pain or discomfort anywhere.  He is alert and oriented to self and the fact that he lives at [facility] at a care facility but not oriented to year.   Past Medical History:  Diagnosis Date  . Anxiety   . Arthritis   . Dementia ([place])   . DNR no code (do not resuscitate)   . Enlarged prostate   . GERD (gastroesophageal reflux disease)   . Heart murmur, systolic 14/48/1856  . HOH (hard of hearing)   . Hypercholesteremia   . Hypertension   . Other constipation 07/01/2017  . Pure hypercholesterolemia 04/01/2011  . Skin cancer 08/22/2016   Overview:  Patient sees Dr. Phillip Heal    Patient Active Problem List   Diagnosis Date Noted  . UTI (urinary tract infection) 10/19/2019  . Hemorrhagic stroke ([place])   . Traumatic rhabdomyolysis ([place])   . Gastroesophageal reflux disease without esophagitis   . Head injury   . Acute metabolic encephalopathy 31/49/7026  . Hypokalemia 02/27/2019  . Elevated troponin 02/27/2019  . Fall   . At high risk for falls 08/28/2017  . Fecal smearing 07/01/2017  . Other constipation 07/01/2017  . Heart murmur, systolic 37/85/8850  . Skin cancer 08/22/2016  . Status post arthroscopy  07/16/2016  . Pure hypercholesterolemia 04/01/2011  . Hypertension 01/20/2011    Past Surgical History:  Procedure Laterality Date  . ANAL FISSURE REPAIR    . CHOLECYSTECTOMY    . EYE SURGERY Bilateral    Cataract Extraction with IOL  . KNEE ARTHROSCOPY Right 07/08/2016   Procedure: ARTHROSCOPY KNEE, PARTIAL MEDIAL MENISECTOMY, CHONDROPLASTY;  Surgeon: Dereck Leep, MD;  Location: [facility] ORS;  Service: Orthopedics;  Laterality: Right;    Prior to Admission medications   Medication Sig Start Date End Date Taking? Authorizing Provider  acetaminophen (TYLENOL) 500 MG tablet Take 1,000 mg by mouth in the morning, at noon, and at bedtime.    [provider]  amLODipine (NORVASC) 10 MG tablet Take 1 tablet (10 mg total) by mouth daily. 03/04/19   Fritzi Mandes, MD  donepezil (ARICEPT) 5 MG tablet Take 5 mg by mouth daily. 01/25/19   [provider]  ferrous sulfate 325 (65 FE) MG tablet Take 325 mg by mouth daily with breakfast.    [provider]  finasteride (PROSCAR) 5 MG tablet Take 5 mg by mouth daily.    [provider]  gabapentin (NEURONTIN) 100 MG capsule Take 100 mg by mouth at bedtime.    [provider]  hydrALAZINE (APRESOLINE) 25 MG tablet Take 1 tablet (25 mg total) by mouth 3 (three) times daily. 03/03/19   Fritzi Mandes, MD  hydrochlorothiazide (MICROZIDE)  12.5 MG capsule Take 1 capsule (12.5 mg total) by mouth daily. 03/03/19   Fritzi Mandes, MD  omeprazole (PRILOSEC) 20 MG capsule Take 20 mg by mouth 2 (two) times daily. 10/02/18 10/19/19  [provider]  pravastatin (PRAVACHOL) 20 MG tablet Take 20 mg by mouth at bedtime.  09/30/15   [provider]  sertraline (ZOLOFT) 25 MG tablet Take 25 mg by mouth daily. 01/25/19   [provider]  tamsulosin (FLOMAX) 0.4 MG CAPS capsule Take 0.4 mg by mouth.    [provider]  traMADol (ULTRAM) 50 MG tablet Take 50 mg by mouth in the morning, at noon, and at  bedtime.    [provider]    Allergies Ace inhibitors, Telmisartan-hctz, Amoxicillin-pot clavulanate, and Ibuprofen  Family History  Problem Relation Age of Onset  . Alzheimer's disease Father   . Prostate cancer Brother     Social History Social History   Tobacco Use  . Smoking status: Former Smoker    Types: Cigarettes    Quit date: 04/14/1977    Years since quitting: 43.1  . Smokeless tobacco: Never Used  Vaping Use  . Vaping Use: Never used  Substance Use Topics  . Alcohol use: No  . Drug use: No    Review of Systems -some caveat as the patient is a poor historian, but I did discuss with his power of attorney who also reports that he is not aware of the patient having any recent or new medical concerns Constitutional: No fever/chills or recent illness Eyes: No visual changes. ENT: No neck pain Cardiovascular: Denies chest pain. Respiratory: Denies shortness of breath. Gastrointestinal: No abdominal pain.   Genitourinary: No pain with urination  musculoskeletal: Negative for back pain.  Some slight pain over his left shin points towards his left mid lower leg.  Does not walk by himself uses a wheelchair.  Denies injury anywhere else. Skin: Negative for rash. Neurological: Negative for headaches, areas of focal weakness or numbness.    ____________________________________________   PHYSICAL EXAM:  VITAL SIGNS: ED Triage Vitals  Enc Vitals Group     BP 05/31/20 0117 (!) 154/94     Pulse Rate 05/31/20 0117 78     Resp 05/31/20 0117 18     Temp 05/31/20 0117 97.6 F (36.4 C)     Temp Source 05/31/20 0117 Oral     SpO2 05/31/20 0117 96 %     Weight 05/31/20 0120 138 lb (62.6 kg)     Height 05/31/20 0120 5\' 6"  (1.676 m)     Head Circumference --      Peak Flow --      Pain Score --      Pain Loc --      Pain Edu? --      Excl. in [place]? --     Constitutional: Alert and oriented to person and place but not to year. Well appearing and in no acute  distress.  He is very pleasant.  Shows no signs of psychomotor agitation Eyes: Conjunctivae are normal. Head: Atraumatic. Nose: No congestion/rhinnorhea. Mouth/Throat: Mucous membranes are moist. Neck: No stridor.  Cardiovascular: Normal rate, regular rhythm. Grossly normal heart sounds.  Good peripheral circulation. Respiratory: Normal respiratory effort.  No retractions. Lungs CTAB. Gastrointestinal: Soft and nontender. No distention. Musculoskeletal:   RIGHT Right upper extremity demonstrates normal strength, good use of all muscles. No edema bruising or contusions of the right shoulder/upper arm, right elbow, right forearm / hand. Full range  of motion of the right right upper extremity without pain. No evidence of trauma. Strong radial pulse. Intact median/ulnar/radial neuro-muscular exam.  LEFT Left upper extremity demonstrates normal strength, good use of all muscles. No edema bruising or contusions of the left shoulder/upper arm, left elbow, left forearm / hand. Full range of motion of the left  upper extremity without pain. No evidence of trauma. Strong radial pulse. Intact median/ulnar/radial neuro-muscular exam.  Lower Extremities  No edema. Normal DP/PT pulses bilateral with good cap refill.  Normal neuro-motor function lower extremities bilateral.  RIGHT Right lower extremity demonstrates frailness but overall generally good strength, good use of all muscles. No edema bruising or contusions of the right hip, right knee, right ankle except there is a small abrasion over his right shin that appears to be chronic and healing over. Full range of motion of the right lower extremity without pain sit for some fraction like contractions the left lower foot. No pain on axial loading. No evidence of trauma.   LEFT Left lower extremity demonstrates frailness but overall generally good strength, good use of all muscles. No edema bruising or contusions of the hip,  knee, ankle. Full range  of motion of the left lower extremity without pain except for some flexion like contractions of the lower foot. No pain on axial loading. No evidence of trauma.  No pain or discomfort axial loading in either extremity.  No pain or discomfort to palpation of the hips bilateral.  Neurologic:  Normal speech and language though he is somewhat quiet and a little bit flat in affect. No gross focal neurologic deficits are appreciated.  Skin:  Skin is warm, dry and intact. No rash noted. Psychiatric: Mood and affect are calm and pleasant  ____________________________________________   LABS (all labs ordered are listed, but only abnormal results are displayed)  Labs Reviewed  CBG MONITORING, ED   ____________________________________________  EKG   ____________________________________________  RADIOLOGY  DG Tibia/Fibula Left  Result Date: 05/31/2020 CLINICAL DATA:  Fall, rolled out of bed.  Left shin pain. EXAM: LEFT TIBIA AND FIBULA - 2 VIEW COMPARISON:  None. FINDINGS: Cortical margins of the tibia and fibula are intact. There is no evidence of fracture or other focal bone lesions. Degenerative change of the knee. Ankle alignment is maintained. Soft tissues are unremarkable. IMPRESSION: No fracture of the left lower leg. Electronically Signed   By: Keith Rake M.D.   On: 05/31/2020 03:34   CT Head Wo Contrast  Result Date: 05/31/2020 CLINICAL DATA:  Head trauma.  Rolled out of bed. EXAM: CT HEAD WITHOUT CONTRAST TECHNIQUE: Contiguous axial images were obtained from the base of the skull through the vertex without intravenous contrast. COMPARISON:  Head CT 11/15/2027 FINDINGS: Brain: Stable degree of atrophy and chronic small vessel ischemia. Ventriculomegaly is likely secondary to central atrophy. No intracranial hemorrhage, mass effect, or midline shift. The basilar cisterns are patent. No evidence of territorial infarct or acute ischemia. No extra-axial or intracranial fluid collection.  Vascular: Atherosclerosis of skullbase vasculature without hyperdense vessel or abnormal calcification. Skull: No fracture or focal lesion. Sinuses/Orbits: Chronic opacification of right maxillary sinus with heterogeneous contents. There is bulging of the medial wall of the maxillary sinus and opacification of adjacent ethmoid air cells. No significant change from prior. Bilateral cataract resection. Other: Minimal left frontal scalp soft tissue density appears chronic and may be related to scarring. IMPRESSION: 1. No acute intracranial abnormality. No skull fracture. 2. Stable atrophy and chronic small vessel ischemia. 3.  Chronic right maxillary sinusitis. Electronically Signed   By: Keith Rake M.D.   On: 05/31/2020 03:00   CT Cervical Spine Wo Contrast  Result Date: 05/31/2020 CLINICAL DATA:  Neck trauma, rolled out of bed. EXAM: CT CERVICAL SPINE WITHOUT CONTRAST TECHNIQUE: Multidetector CT imaging of the cervical spine was performed without intravenous contrast. Multiplanar CT image reconstructions were also generated. COMPARISON:  CT cervical spine 02/27/2019 FINDINGS: Alignment: Exaggerated cervical lordosis. Trace anterolisthesis of C6 on C7. Skull base and vertebrae: No acute fracture. Vertebral body heights are maintained. The dens and skull base are intact. Soft tissues and spinal canal: No prevertebral fluid or swelling. No visible canal hematoma. Disc levels: Stable multilevel degenerative disc disease. Stable multilevel facet hypertrophy. Prominent Schmorl's node superior endplate of C7. Upper chest: No acute or unexpected findings. Other: Carotid calcifications. IMPRESSION: 1. No acute fracture or subluxation of the cervical spine. 2. Stable multilevel degenerative disc disease and facet hypertrophy. Electronically Signed   By: Keith Rake M.D.   On: 05/31/2020 03:03     Head CT personally viewed by me I do not see evidence of acute large intracranial hemorrhage.  Further read by  radiologist, reviewed as above.  CT cervical spine negative for acute fracture.  Left lower tib-fib negative for acute ____________________________________________   PROCEDURES  Procedure(s) performed: None  Procedures  Critical Care performed: No  ____________________________________________   INITIAL IMPRESSION / ASSESSMENT AND PLAN / ED COURSE  Pertinent labs & imaging results that were available during my care of the patient were reviewed by me and considered in my medical decision making (see chart for details).   Patient presents after what appears to be a mechanical fall or roll out of bed.  He does not demonstrate any obvious injury other than some slight soreness that he reports over his left shin but no obvious injury on exam or to palpation.  No evidence of acute head injury and CT imaging is negative.  Discussed plan of care and evaluation plans with his power of attorney. Clinical Course as of 05/31/20 0340  Wed May 31, 2020  0310 Discussed with Raynelle Dick his POA.  Marcello Moores is agreeable with obtaining an x-ray of his left shin, reports he is nonambulatory at baseline.  Additionally, discussed that he has not had any recent illnesses or known illnesses at [facility], discussed obtaining blood work and after discussing with power of attorney decision to instead just get x-ray to make sure there is no obvious injury and if patient continues to remain well to discharge back to [facility] for ongoing care.  Marcello Moores advises he is going to work with membranes to see if that bedrail or something can be installed to prevent him from rolling out of bed in the future. [MQ]    Clinical Course User Index [MQ] Delman Kitten, MD   Overall patient appears well nontoxic suspect indeed he likely did just follow the bed he has no fever he has no signs or symptoms of systemic illness.  His mental status appears to be at baseline.  Plan to discharge back to [facility] assisted living for  ongoing care.   Patient resting comfortably no distress.  Appropriate for discharge at this time.  I have discussed plan of care with his healthcare power of attorney Trixie Dredge.  Return precautions and treatment recommendations and follow-up discussed with the patient who is agreeable with the plan.  ____________________________________________   FINAL CLINICAL IMPRESSION(S) / ED DIAGNOSES  Final diagnoses:  Fall, initial encounter  Dementia without behavioral disturbance, unspecified dementia type [facility])        Note:  This document was prepared using Systems analyst and may include unintentional dictation errors       Delman Kitten, MD 05/31/20 [phone]

## 2020-05-31 NOTE — ED Triage Notes (Signed)
Pt to ED [facility] from [facility] for rolling out of bed. Pt oriented to person, place and situation. Disoriented to year. No neck tenderness, denies hitting head.  NAD noted

## 2020-07-22 ENCOUNTER — Other Ambulatory Visit: Payer: Self-pay

## 2020-07-22 ENCOUNTER — Emergency Department: Payer: Medicare Other

## 2020-07-22 ENCOUNTER — Emergency Department
Admission: EM | Admit: 2020-07-22 | Discharge: 2020-07-23 | Disposition: A | Discharge status: Home / Self Care | Payer: Medicare Other | Attending: Emergency Medicine | Admitting: Emergency Medicine

## 2020-07-22 DIAGNOSIS — F0390 Unspecified dementia without behavioral disturbance: Secondary | ICD-10-CM | POA: Insufficient documentation

## 2020-07-22 DIAGNOSIS — Z79899 Other long term (current) drug therapy: Secondary | ICD-10-CM | POA: Insufficient documentation

## 2020-07-22 DIAGNOSIS — I10 Essential (primary) hypertension: Secondary | ICD-10-CM | POA: Diagnosis not present

## 2020-07-22 DIAGNOSIS — R519 Headache, unspecified: Secondary | ICD-10-CM | POA: Diagnosis present

## 2020-07-22 DIAGNOSIS — N3090 Cystitis, unspecified without hematuria: Secondary | ICD-10-CM | POA: Diagnosis not present

## 2020-07-22 DIAGNOSIS — Z87891 Personal history of nicotine dependence: Secondary | ICD-10-CM | POA: Diagnosis not present

## 2020-07-22 DIAGNOSIS — W19XXXA Unspecified fall, initial encounter: Secondary | ICD-10-CM

## 2020-07-22 DIAGNOSIS — Z96651 Presence of right artificial knee joint: Secondary | ICD-10-CM | POA: Diagnosis not present

## 2020-07-22 DIAGNOSIS — W1830XA Fall on same level, unspecified, initial encounter: Secondary | ICD-10-CM | POA: Diagnosis not present

## 2020-07-22 DIAGNOSIS — Z85828 Personal history of other malignant neoplasm of skin: Secondary | ICD-10-CM | POA: Insufficient documentation

## 2020-07-22 LAB — BASIC METABOLIC PANEL
Anion gap: 9 (ref 5–15)
BUN: 36 mg/dL — ABNORMAL HIGH (ref 8–23)
CO2: 27 mmol/L (ref 22–32)
Calcium: 9 mg/dL (ref 8.9–10.3)
Chloride: 103 mmol/L (ref 98–111)
Creatinine, Ser: 1.33 mg/dL — ABNORMAL HIGH (ref 0.61–1.24)
GFR, Estimated: 50 mL/min — ABNORMAL LOW (ref >60)
Glucose, Bld: 98 mg/dL (ref 70–99)
Potassium: 3.7 mmol/L (ref 3.5–5.1)
Sodium: 139 mmol/L (ref 135–145)

## 2020-07-22 LAB — CBC WITH DIFFERENTIAL/PLATELET
Abs Immature Granulocytes: 0.04 10*3/uL (ref 0.00–0.07)
Basophils Absolute: 0 10*3/uL (ref 0.0–0.1)
Basophils Relative: 0 %
Eosinophils Absolute: 0.2 10*3/uL (ref 0.0–0.5)
Eosinophils Relative: 2 %
HCT: 36.3 % — ABNORMAL LOW (ref 39.0–52.0)
Hemoglobin: 12 g/dL — ABNORMAL LOW (ref 13.0–17.0)
Immature Granulocytes: 0 %
Lymphocytes Relative: 10 %
Lymphs Abs: 1 10*3/uL (ref 0.7–4.0)
MCH: 29.8 pg (ref 26.0–34.0)
MCHC: 33.1 g/dL (ref 30.0–36.0)
MCV: 90.1 fL (ref 80.0–100.0)
Monocytes Absolute: 0.7 10*3/uL (ref 0.1–1.0)
Monocytes Relative: 7 %
Neutro Abs: 7.8 10*3/uL — ABNORMAL HIGH (ref 1.7–7.7)
Neutrophils Relative %: 81 %
Platelets: 241 10*3/uL (ref 150–400)
RBC: 4.03 MIL/uL — ABNORMAL LOW (ref 4.22–5.81)
RDW: 15.1 % (ref 11.5–15.5)
WBC: 9.7 10*3/uL (ref 4.0–10.5)
nRBC: 0 % (ref 0.0–0.2)

## 2020-07-22 MED ORDER — ACETAMINOPHEN 500 MG PO TABS
1000.0000 mg | ORAL_TABLET | Freq: Once | ORAL | Status: AC
  Administered 2020-07-22: 1000 mg via ORAL
  Filled 2020-07-22: qty 2

## 2020-07-22 NOTE — ED Triage Notes (Signed)
Pt BIBA from an assisted living facility for an unwitnessed fall. Pt was found face down by staff. No obvious deformities. Unsure if LOC. Pts only complaint is a headache since the fall. Pt oriented to baseline. Hx of dementia.

## 2020-07-22 NOTE — ED Provider Notes (Signed)
[facility] Emergency Department Provider Note  ____________________________________________   Event Date/Time   First MD Initiated Contact with Patient 07/22/20 2250     (approximate)  I have reviewed the triage vital signs and the nursing notes.   HISTORY  Chief Complaint Fall    HPI Billy Bennett. is a 87 y.o. male with dementia who comes in from assisted living facility for fall.  Patient had an unwitnessed fall today and was found down on the ground.  Patient concerned of a headache, that started after fall, constant, nothing makes it better has not been given anything with EMS.  Unclear if he had LOC.  Patient is oriented x2 which is at baseline for patient due to his history of dementia.  He denies any chest pain, shortness of breath, abdominal pain or any other concerns.  We did call the facility and they stated that patient is wheelchair-bound.          Past Medical History:  Diagnosis Date  . Anxiety   . Arthritis   . Dementia ([place])   . DNR no code (do not resuscitate)   . Enlarged prostate   . GERD (gastroesophageal reflux disease)   . Heart murmur, systolic 12/75/1700  . HOH (hard of hearing)   . Hypercholesteremia   . Hypertension   . Other constipation 07/01/2017  . Pure hypercholesterolemia 04/01/2011  . Skin cancer 08/22/2016   Overview:  Patient sees Dr. Phillip Heal    Patient Active Problem List   Diagnosis Date Noted  . UTI (urinary tract infection) 10/19/2019  . Hemorrhagic stroke ([place])   . Traumatic rhabdomyolysis ([place])   . Gastroesophageal reflux disease without esophagitis   . Head injury   . Acute metabolic encephalopathy 17/49/4496  . Hypokalemia 02/27/2019  . Elevated troponin 02/27/2019  . Fall   . At high risk for falls 08/28/2017  . Fecal smearing 07/01/2017  . Other constipation 07/01/2017  . Heart murmur, systolic 75/91/6384  . Skin cancer 08/22/2016  . Status post arthroscopy 07/16/2016  . Pure  hypercholesterolemia 04/01/2011  . Hypertension 01/20/2011    Past Surgical History:  Procedure Laterality Date  . ANAL FISSURE REPAIR    . CHOLECYSTECTOMY    . EYE SURGERY Bilateral    Cataract Extraction with IOL  . KNEE ARTHROSCOPY Right 07/08/2016   Procedure: ARTHROSCOPY KNEE, PARTIAL MEDIAL MENISECTOMY, CHONDROPLASTY;  Surgeon: Dereck Leep, MD;  Location: [facility] ORS;  Service: Orthopedics;  Laterality: Right;    Prior to Admission medications   Medication Sig Start Date End Date Taking? Authorizing Provider  acetaminophen (TYLENOL) 500 MG tablet Take 1,000 mg by mouth in the morning, at noon, and at bedtime.    [provider]  amLODipine (NORVASC) 10 MG tablet Take 1 tablet (10 mg total) by mouth daily. 03/04/19   Fritzi Mandes, MD  donepezil (ARICEPT) 5 MG tablet Take 5 mg by mouth daily. 01/25/19   [provider]  ferrous sulfate 325 (65 FE) MG tablet Take 325 mg by mouth daily with breakfast.    [provider]  finasteride (PROSCAR) 5 MG tablet Take 5 mg by mouth daily.    [provider]  gabapentin (NEURONTIN) 100 MG capsule Take 100 mg by mouth at bedtime.    [provider]  hydrALAZINE (APRESOLINE) 25 MG tablet Take 1 tablet (25 mg total) by mouth 3 (three) times daily. 03/03/19   Fritzi Mandes, MD  hydrochlorothiazide (MICROZIDE) 12.5 MG capsule Take 1 capsule (  12.5 mg total) by mouth daily. 03/03/19   Fritzi Mandes, MD  omeprazole (PRILOSEC) 20 MG capsule Take 20 mg by mouth 2 (two) times daily. 10/02/18 10/19/19  [provider]  pravastatin (PRAVACHOL) 20 MG tablet Take 20 mg by mouth at bedtime.  09/30/15   [provider]  sertraline (ZOLOFT) 25 MG tablet Take 25 mg by mouth daily. 01/25/19   [provider]  tamsulosin (FLOMAX) 0.4 MG CAPS capsule Take 0.4 mg by mouth.    [provider]  traMADol (ULTRAM) 50 MG tablet Take 50 mg by mouth in the morning, at noon, and at bedtime.    [provider]    Allergies Ace inhibitors, Telmisartan-hctz, Amoxicillin-pot clavulanate, and Ibuprofen  Family History  Problem Relation Age of Onset  . Alzheimer's disease Father   . Prostate cancer Brother     Social History Social History   Tobacco Use  . Smoking status: Former Smoker    Types: Cigarettes    Quit date: 04/14/1977    Years since quitting: 43.3  . Smokeless tobacco: Never Used  Vaping Use  . Vaping Use: Never used  Substance Use Topics  . Alcohol use: No  . Drug use: No      Review of Systems Constitutional: No fever/chills, fall Eyes: No visual changes. ENT: No sore throat. Cardiovascular: Denies chest pain. Respiratory: Denies shortness of breath. Gastrointestinal: No abdominal pain.  No nausea, no vomiting.  No diarrhea.  No constipation. Genitourinary: Negative for dysuria. Musculoskeletal: Negative for back pain. Skin: Negative for rash. Neurological: Positive for headache, no focal weakness or numbness. All other ROS negative ____________________________________________   PHYSICAL EXAM:  VITAL SIGNS: Blood pressure (!) 163/76, pulse 74, temperature 98.2 F (36.8 C), temperature source Oral, resp. rate 17, height 5\' 6"  (1.676 m), weight 67 kg, SpO2 96 %.   Constitutional: Alert and oriented x2. Well appearing and in no acute distress. Eyes: Conjunctivae are normal. EOMI. Head: Atraumatic. Nose: No congestion/rhinnorhea. Mouth/Throat: Mucous membranes are moist.   Neck: No stridor. Trachea Midline. FROM Cardiovascular: Normal rate, regular rhythm. Grossly normal heart sounds.  Good peripheral circulation. Respiratory: Normal respiratory effort.  No retractions. Lungs CTAB. Gastrointestinal: Soft and nontender. No distention. No abdominal bruits.   Musculoskeletal: No lower extremity tenderness nor edema.  No joint effusions. No pain with passive motion of his legs.  Good grip strength Neurologic:  Normal speech and language. No  gross focal neurologic deficits are appreciated.  Good grip strength bilaterally but unable to lift up either of his legs. Skin:  Skin is warm, dry and intact. No rash noted. Psychiatric: Mood and affect are normal. Speech and behavior are normal. GU: Deferred   ____________________________________________   LABS (all labs ordered are listed, but only abnormal results are displayed)  Labs Reviewed  CBC WITH DIFFERENTIAL/PLATELET - Abnormal; Notable for the following components:      Result Value   RBC 4.03 (*)    Hemoglobin 12.0 (*)    HCT 36.3 (*)    Neutro Abs 7.8 (*)    All other components within normal limits  BASIC METABOLIC PANEL - Abnormal; Notable for the following components:   BUN 36 (*)    Creatinine, Ser 1.33 (*)    GFR, Estimated 50 (*)    All other components within normal limits  URINALYSIS, COMPLETE (UACMP) WITH MICROSCOPIC   ____________________________________________   ED ECG REPORT I, Vanessa [place], the attending physician, personally viewed and interpreted this ECG.  Sinus rate of 84, no ST elevation, no T wave inversions, normal intervals  ____________________________________________  RADIOLOGY Robert Bellow, personally viewed and evaluated these images (plain radiographs) as part of my medical decision making, as well as reviewing the written report by the radiologist.  ED MD interpretation:  Xray no obvious fracture   Official radiology report(s): CT Head Wo Contrast  Result Date: 07/22/2020 CLINICAL DATA:  Unwitnessed fall, found face down, headache EXAM: CT HEAD WITHOUT CONTRAST CT CERVICAL SPINE WITHOUT CONTRAST TECHNIQUE: Multidetector CT imaging of the head and cervical spine was performed following the standard protocol without intravenous contrast. Multiplanar CT image reconstructions of the cervical spine were also generated. COMPARISON:  05/31/2020 FINDINGS: CT HEAD FINDINGS Brain: No evidence of acute infarction, hemorrhage, hydrocephalus,  extra-axial collection or mass lesion/mass effect. Periventricular and deep white matter hypodensity. Mild global cerebral volume loss. Vascular: No hyperdense vessel or unexpected calcification. Skull: Normal. Negative for fracture or focal lesion. Sinuses/Orbits: No acute finding. Redemonstrated complete opacification of the right maxillary sinus, partially imaged and consistent with chronic sinusitis. Other: None. CT CERVICAL SPINE FINDINGS Alignment: Minimal multilevel degenerative listhesis. Skull base and vertebrae: No acute fracture. No primary bone lesion or focal pathologic process. Soft tissues and spinal canal: No prevertebral fluid or swelling. No visible canal hematoma. Disc levels: Moderate multilevel disc space height loss and osteophytosis. Upper chest: Negative. Other: None. IMPRESSION: 1. No acute intracranial pathology. Small-vessel white matter disease and cerebral volume loss. 2. Redemonstrated complete opacification of the right maxillary sinus, partially imaged and consistent with chronic sinusitis. 3. No fracture or static subluxation of the cervical spine. 4. Moderate multilevel disc space height loss and osteophytosis. Electronically Signed   By: Eddie Candle M.D.   On: 07/22/2020 23:51   CT Cervical Spine Wo Contrast  Result Date: 07/22/2020 CLINICAL DATA:  Unwitnessed fall, found face down, headache EXAM: CT HEAD WITHOUT CONTRAST CT CERVICAL SPINE WITHOUT CONTRAST TECHNIQUE: Multidetector CT imaging of the head and cervical spine was performed following the standard protocol without intravenous contrast. Multiplanar CT image reconstructions of the cervical spine were also generated. COMPARISON:  05/31/2020 FINDINGS: CT HEAD FINDINGS Brain: No evidence of acute infarction, hemorrhage, hydrocephalus, extra-axial collection or mass lesion/mass effect. Periventricular and deep white matter hypodensity. Mild global cerebral volume loss. Vascular: No hyperdense vessel or unexpected  calcification. Skull: Normal. Negative for fracture or focal lesion. Sinuses/Orbits: No acute finding. Redemonstrated complete opacification of the right maxillary sinus, partially imaged and consistent with chronic sinusitis. Other: None. CT CERVICAL SPINE FINDINGS Alignment: Minimal multilevel degenerative listhesis. Skull base and vertebrae: No acute fracture. No primary bone lesion or focal pathologic process. Soft tissues and spinal canal: No prevertebral fluid or swelling. No visible canal hematoma. Disc levels: Moderate multilevel disc space height loss and osteophytosis. Upper chest: Negative. Other: None. IMPRESSION: 1. No acute intracranial pathology. Small-vessel white matter disease and cerebral volume loss. 2. Redemonstrated complete opacification of the right maxillary sinus, partially imaged and consistent with chronic sinusitis. 3. No fracture or static subluxation of the cervical spine. 4. Moderate multilevel disc space height loss and osteophytosis. Electronically Signed   By: Eddie Candle M.D.   On: 07/22/2020 23:51   CT PELVIS WO CONTRAST  Result Date: 07/23/2020 CLINICAL DATA:  Evaluate for pelvic fracture. EXAM: CT PELVIS WITHOUT CONTRAST TECHNIQUE: Multidetector CT imaging of the pelvis was performed following the standard protocol without intravenous contrast. COMPARISON:  Abdomen pelvis CT, dated October 19, 2019 FINDINGS: Urinary Tract: A 2.2 cm  diameter cystic appearing area is seen within the lower pole of the left kidney. Mild, posterior urinary bladder wall thickening is seen. Bowel: There is no evidence of bowel dilatation. The appendix is normal in appearance. Noninflamed diverticula are seen throughout the large bowel. Vascular/Lymphatic: No pathologically enlarged lymph nodes. There is marked severity calcification of the visualized portion of the abdominal aorta and bilateral common iliac arteries with stable 3.1 cm aneurysmal dilatation of the abdominal aorta just above the level  of the bifurcation. Reproductive:  The prostate gland is markedly enlarged. Other:  None. Musculoskeletal: There is no evidence of acute osseous abnormality. Marked severity multilevel degenerative changes are seen throughout the visualized portion of the lower lumbar spine. IMPRESSION: 1. Degenerative changes, as described above, without evidence of an acute fracture or dislocation. 2. Marked severity atherosclerotic disease with stable aneurysmal dilatation of the infrarenal abdominal aorta. 3. 2.2 cm left renal cyst. 4. Mild, posterior urinary bladder wall thickening which may be secondary to underlying cystitis. Correlation with urinalysis is recommended. 5. Colonic diverticulosis. 6. Enlarged prostate gland. Aortic Atherosclerosis (ICD10-I70.0). Electronically Signed   By: Virgina Norfolk M.D.   On: 07/23/2020 01:09   DG Chest Portable 1 View  Result Date: 07/23/2020 CLINICAL DATA:  Fall EXAM: PORTABLE CHEST 1 VIEW COMPARISON:  10/18/2019 FINDINGS: No focal opacity or pleural effusion. Stable cardiomediastinal silhouette with aortic atherosclerosis. No pneumothorax. IMPRESSION: No active disease. Electronically Signed   By: Donavan Foil M.D.   On: 07/23/2020 00:10   DG HIP UNILAT WITH PELVIS 2-3 VIEWS LEFT  Result Date: 07/23/2020 CLINICAL DATA:  Unwitnessed fall EXAM: DG HIP (WITH OR WITHOUT PELVIS) 2-3V LEFT COMPARISON:  02/27/2019 FINDINGS: SI joints are non widened. Pubic symphysis appears intact. Questionable nondisplaced fracture at the left superior pubic ramus. Left femoral head demonstrates no fracture or malalignment. IMPRESSION: Questionable nondisplaced fracture at the left superior pubic ramus. Electronically Signed   By: Donavan Foil M.D.   On: 07/23/2020 00:09   DG HIP UNILAT WITH PELVIS 2-3 VIEWS RIGHT  Result Date: 07/23/2020 CLINICAL DATA:  Unwitnessed fall EXAM: DG HIP (WITH OR WITHOUT PELVIS) 2-3V RIGHT COMPARISON:  CT 10/19/2019, radiograph 01/31/2017 FINDINGS: Questionable  lucency at the left superior pubic ramus. Pubic symphysis appears intact. Right rami show no fracture. The right hip shows no fracture or malalignment. IMPRESSION: Questionable lucency at the left superior pubic ramus. Otherwise negative Electronically Signed   By: Donavan Foil M.D.   On: 07/23/2020 00:11    ____________________________________________   PROCEDURES  Procedure(s) performed (including Critical Care):  .1-3 Lead EKG Interpretation Performed by: Vanessa [place], MD Authorized by: Vanessa Grayling, MD     Interpretation: abnormal     ECG rate:  80s    ECG rate assessment: normal     Rhythm: sinus rhythm     Ectopy: none     Conduction: normal       ____________________________________________   INITIAL IMPRESSION / ASSESSMENT AND PLAN / ED COURSE  Billy Bennett. was evaluated in Emergency Department on 07/22/2020 for the symptoms described in the history of present illness. He was evaluated in the context of the global COVID-19 pandemic, which necessitated consideration that the patient might be at risk for infection with the SARS-CoV-2 virus that causes COVID-19. Institutional protocols and algorithms that pertain to the evaluation of patients at risk for COVID-19 are in a state of rapid change based on information released by regulatory bodies including the CDC and federal and state  organizations. These policies and algorithms were followed during the patient's care in the ED.    Patient is a 87 year old with dementia who is at his baseline mental status who comes in after a fall and being found down on the ground.  Will get CT head evaluate for intercranial hemorrhage CT cervical evaluate for cervical fracture.  Patient has no other chest wall tenderness, abdominal tenderness or extremity tenderness to suggest other injuries.  Will get basic labs to evaluate for electolyte abnormalities, AKI.  Labs are reviewed kidney function slightly elevated but has intermittently  had elevated kidney function in the past.  His hemoglobin is at baseline.  No white count elevation to suggest infection.  CT head and neck are negative.  On the CT scan there is a possible left superior pubic ramus fracture.-- will get CT hip to further characterize.   CT scan only shows degenerative changes without evidence of an acute fracture.  There was concern for mild bladder wall thickening so we will add on UA to make sure evidence of UTI  UA concerning for UTI.  Unclear if patient is having symptoms given his dementia but he is previously from organisms sensitive to Keflex therefore will start on a course of Keflex and send urine culture.  Reevaluated patient doing well updated on results and will discharge back to facility    ____________________________________________   FINAL CLINICAL IMPRESSION(S) / ED DIAGNOSES   Final diagnoses:  Fall, initial encounter  Cystitis      MEDICATIONS GIVEN DURING THIS VISIT:  Medications  acetaminophen (TYLENOL) tablet 1,000 mg (1,000 mg Oral Given 07/22/20 2322)     ED Discharge Orders         Ordered    cephALEXin (KEFLEX) 500 MG capsule  2 times daily        07/23/20 0329           Note:  This document was prepared using Dragon voice recognition software and may include unintentional dictation errors.   Vanessa [place], MD 07/23/20 [phone]

## 2020-07-23 ENCOUNTER — Telehealth: Payer: Self-pay | Admitting: Emergency Medicine

## 2020-07-23 ENCOUNTER — Emergency Department: Payer: Medicare Other

## 2020-07-23 DIAGNOSIS — R519 Headache, unspecified: Secondary | ICD-10-CM | POA: Diagnosis not present

## 2020-07-23 LAB — URINALYSIS, COMPLETE (UACMP) WITH MICROSCOPIC
Bilirubin Urine: NEGATIVE
Glucose, UA: NEGATIVE mg/dL
Ketones, ur: NEGATIVE mg/dL
Nitrite: NEGATIVE
Protein, ur: 100 mg/dL — AB
RBC / HPF: >50 RBC/hpf — ABNORMAL HIGH (ref 0–5)
Specific Gravity, Urine: 1.014 (ref 1.005–1.030)
Squamous Epithelial / HPF: NONE SEEN (ref 0–5)
WBC, UA: >50 WBC/hpf — ABNORMAL HIGH (ref 0–5)
pH: 6 (ref 5.0–8.0)

## 2020-07-23 MED ORDER — CEPHALEXIN 500 MG PO CAPS: 500.0000 mg | ORAL_CAPSULE | Freq: Two times a day (BID) | ORAL | 0 refills | Status: DC

## 2020-07-23 MED ORDER — CEPHALEXIN 500 MG PO CAPS: 500.0000 mg | ORAL_CAPSULE | Freq: Two times a day (BID) | ORAL | 0 refills | Status: AC

## 2020-07-23 NOTE — ED Notes (Signed)
In and out cath attempted by Miguel Aschoff and Delilah Shan RN. Unable to pass prostate and collect urine sample at this time. MD made aware. External catheter placed on pt at this time. Brief saturated with urine removed and peri care performed. Clean brief and chux placed on patient. Covered with blankets.

## 2020-07-23 NOTE — Telephone Encounter (Signed)
Called prescription to different pharmacy. Did not personally evaluate the patient.

## 2020-07-23 NOTE — ED Notes (Signed)
Called [facility] @ 0345 to transport pt back to [facility]

## 2020-07-23 NOTE — Discharge Instructions (Signed)
CT scans were negative for injuries although there was concern for UTI therefore I started him on Keflex.  Return the ER for fevers.  Follow with his PCP for urine culture results in 2 to 3 days.  Return to the ER for any other concerns

## 2020-07-25 LAB — URINE CULTURE: Culture: >=100000 — AB

## 2020-09-28 ENCOUNTER — Other Ambulatory Visit: Payer: Self-pay

## 2020-09-28 ENCOUNTER — Inpatient Hospital Stay
Admission: EM | Admit: 2020-09-28 | Discharge: 2020-10-09 | DRG: 689 | Disposition: A | Discharge status: Hospice | Payer: Medicare Other | Attending: Internal Medicine | Admitting: Internal Medicine

## 2020-09-28 ENCOUNTER — Emergency Department: Payer: Medicare Other

## 2020-09-28 DIAGNOSIS — E870 Hyperosmolality and hypernatremia: Secondary | ICD-10-CM | POA: Diagnosis present

## 2020-09-28 DIAGNOSIS — G629 Polyneuropathy, unspecified: Secondary | ICD-10-CM | POA: Diagnosis present

## 2020-09-28 DIAGNOSIS — N390 Urinary tract infection, site not specified: Principal | ICD-10-CM | POA: Diagnosis present

## 2020-09-28 DIAGNOSIS — L8990 Pressure ulcer of unspecified site, unspecified stage: Secondary | ICD-10-CM | POA: Insufficient documentation

## 2020-09-28 DIAGNOSIS — Z515 Encounter for palliative care: Secondary | ICD-10-CM

## 2020-09-28 DIAGNOSIS — Z880 Allergy status to penicillin: Secondary | ICD-10-CM

## 2020-09-28 DIAGNOSIS — F32A Depression, unspecified: Secondary | ICD-10-CM | POA: Diagnosis present

## 2020-09-28 DIAGNOSIS — L89302 Pressure ulcer of unspecified buttock, stage 2: Secondary | ICD-10-CM | POA: Diagnosis not present

## 2020-09-28 DIAGNOSIS — L89312 Pressure ulcer of right buttock, stage 2: Secondary | ICD-10-CM | POA: Diagnosis present

## 2020-09-28 DIAGNOSIS — G9341 Metabolic encephalopathy: Secondary | ICD-10-CM | POA: Diagnosis present

## 2020-09-28 DIAGNOSIS — E7800 Pure hypercholesterolemia, unspecified: Secondary | ICD-10-CM | POA: Diagnosis present

## 2020-09-28 DIAGNOSIS — F0391 Unspecified dementia with behavioral disturbance: Secondary | ICD-10-CM | POA: Diagnosis not present

## 2020-09-28 DIAGNOSIS — Z888 Allergy status to other drugs, medicaments and biological substances status: Secondary | ICD-10-CM

## 2020-09-28 DIAGNOSIS — Z20822 Contact with and (suspected) exposure to covid-19: Secondary | ICD-10-CM | POA: Diagnosis present

## 2020-09-28 DIAGNOSIS — R627 Adult failure to thrive: Secondary | ICD-10-CM | POA: Diagnosis present

## 2020-09-28 DIAGNOSIS — R4182 Altered mental status, unspecified: Secondary | ICD-10-CM | POA: Diagnosis present

## 2020-09-28 DIAGNOSIS — Z993 Dependence on wheelchair: Secondary | ICD-10-CM

## 2020-09-28 DIAGNOSIS — Z8673 Personal history of transient ischemic attack (TIA), and cerebral infarction without residual deficits: Secondary | ICD-10-CM

## 2020-09-28 DIAGNOSIS — F0390 Unspecified dementia without behavioral disturbance: Secondary | ICD-10-CM | POA: Diagnosis not present

## 2020-09-28 DIAGNOSIS — E871 Hypo-osmolality and hyponatremia: Secondary | ICD-10-CM | POA: Diagnosis present

## 2020-09-28 DIAGNOSIS — F419 Anxiety disorder, unspecified: Secondary | ICD-10-CM | POA: Diagnosis present

## 2020-09-28 DIAGNOSIS — Z79899 Other long term (current) drug therapy: Secondary | ICD-10-CM

## 2020-09-28 DIAGNOSIS — Z7401 Bed confinement status: Secondary | ICD-10-CM

## 2020-09-28 DIAGNOSIS — D649 Anemia, unspecified: Secondary | ICD-10-CM | POA: Diagnosis present

## 2020-09-28 DIAGNOSIS — R633 Feeding difficulties, unspecified: Secondary | ICD-10-CM | POA: Diagnosis present

## 2020-09-28 DIAGNOSIS — N179 Acute kidney failure, unspecified: Secondary | ICD-10-CM | POA: Diagnosis present

## 2020-09-28 DIAGNOSIS — Z7189 Other specified counseling: Secondary | ICD-10-CM | POA: Diagnosis not present

## 2020-09-28 DIAGNOSIS — K219 Gastro-esophageal reflux disease without esophagitis: Secondary | ICD-10-CM | POA: Diagnosis present

## 2020-09-28 DIAGNOSIS — M549 Dorsalgia, unspecified: Secondary | ICD-10-CM | POA: Diagnosis present

## 2020-09-28 DIAGNOSIS — Z6821 Body mass index (BMI) 21.0-21.9, adult: Secondary | ICD-10-CM

## 2020-09-28 DIAGNOSIS — N1831 Chronic kidney disease, stage 3a: Secondary | ICD-10-CM | POA: Diagnosis present

## 2020-09-28 DIAGNOSIS — N400 Enlarged prostate without lower urinary tract symptoms: Secondary | ICD-10-CM | POA: Diagnosis present

## 2020-09-28 DIAGNOSIS — D696 Thrombocytopenia, unspecified: Secondary | ICD-10-CM | POA: Diagnosis not present

## 2020-09-28 DIAGNOSIS — I129 Hypertensive chronic kidney disease with stage 1 through stage 4 chronic kidney disease, or unspecified chronic kidney disease: Secondary | ICD-10-CM | POA: Diagnosis present

## 2020-09-28 DIAGNOSIS — R1310 Dysphagia, unspecified: Secondary | ICD-10-CM | POA: Diagnosis present

## 2020-09-28 DIAGNOSIS — H9190 Unspecified hearing loss, unspecified ear: Secondary | ICD-10-CM | POA: Diagnosis present

## 2020-09-28 DIAGNOSIS — Z85828 Personal history of other malignant neoplasm of skin: Secondary | ICD-10-CM

## 2020-09-28 DIAGNOSIS — Z8042 Family history of malignant neoplasm of prostate: Secondary | ICD-10-CM

## 2020-09-28 DIAGNOSIS — T17908A Unspecified foreign body in respiratory tract, part unspecified causing other injury, initial encounter: Secondary | ICD-10-CM

## 2020-09-28 DIAGNOSIS — Z87891 Personal history of nicotine dependence: Secondary | ICD-10-CM

## 2020-09-28 DIAGNOSIS — Z9049 Acquired absence of other specified parts of digestive tract: Secondary | ICD-10-CM

## 2020-09-28 DIAGNOSIS — I10 Essential (primary) hypertension: Secondary | ICD-10-CM | POA: Diagnosis not present

## 2020-09-28 DIAGNOSIS — Z66 Do not resuscitate: Secondary | ICD-10-CM | POA: Diagnosis present

## 2020-09-28 DIAGNOSIS — R638 Other symptoms and signs concerning food and fluid intake: Secondary | ICD-10-CM | POA: Diagnosis not present

## 2020-09-28 DIAGNOSIS — N189 Chronic kidney disease, unspecified: Secondary | ICD-10-CM | POA: Diagnosis not present

## 2020-09-28 LAB — RESP PANEL BY RT-PCR (FLU A&B, COVID) ARPGX2
Influenza A by PCR: NEGATIVE
Influenza B by PCR: NEGATIVE
SARS Coronavirus 2 by RT PCR: NEGATIVE

## 2020-09-28 LAB — URINE DRUG SCREEN, QUALITATIVE (ARMC ONLY)
Amphetamines, Ur Screen: NOT DETECTED
Barbiturates, Ur Screen: NOT DETECTED
Benzodiazepine, Ur Scrn: NOT DETECTED
Cannabinoid 50 Ng, Ur ~~LOC~~: NOT DETECTED
Cocaine Metabolite,Ur ~~LOC~~: NOT DETECTED
MDMA (Ecstasy)Ur Screen: NOT DETECTED
Methadone Scn, Ur: NOT DETECTED
Opiate, Ur Screen: NOT DETECTED
Phencyclidine (PCP) Ur S: NOT DETECTED
Tricyclic, Ur Screen: NOT DETECTED

## 2020-09-28 LAB — BASIC METABOLIC PANEL
Anion gap: 10 (ref 5–15)
BUN: 46 mg/dL — ABNORMAL HIGH (ref 8–23)
CO2: 23 mmol/L (ref 22–32)
Calcium: 9 mg/dL (ref 8.9–10.3)
Chloride: 113 mmol/L — ABNORMAL HIGH (ref 98–111)
Creatinine, Ser: 1.5 mg/dL — ABNORMAL HIGH (ref 0.61–1.24)
GFR, Estimated: 43 mL/min — ABNORMAL LOW (ref >60)
Glucose, Bld: 101 mg/dL — ABNORMAL HIGH (ref 70–99)
Potassium: 3.7 mmol/L (ref 3.5–5.1)
Sodium: 146 mmol/L — ABNORMAL HIGH (ref 135–145)

## 2020-09-28 LAB — CBC WITH DIFFERENTIAL/PLATELET
Abs Immature Granulocytes: 0.08 10*3/uL — ABNORMAL HIGH (ref 0.00–0.07)
Basophils Absolute: 0 10*3/uL (ref 0.0–0.1)
Basophils Relative: 0 %
Eosinophils Absolute: 0.1 10*3/uL (ref 0.0–0.5)
Eosinophils Relative: 2 %
HCT: 38.6 % — ABNORMAL LOW (ref 39.0–52.0)
Hemoglobin: 12.3 g/dL — ABNORMAL LOW (ref 13.0–17.0)
Immature Granulocytes: 1 %
Lymphocytes Relative: 12 %
Lymphs Abs: 0.9 10*3/uL (ref 0.7–4.0)
MCH: 30 pg (ref 26.0–34.0)
MCHC: 31.9 g/dL (ref 30.0–36.0)
MCV: 94.1 fL (ref 80.0–100.0)
Monocytes Absolute: 0.4 10*3/uL (ref 0.1–1.0)
Monocytes Relative: 6 %
Neutro Abs: 5.5 10*3/uL (ref 1.7–7.7)
Neutrophils Relative %: 79 %
Platelets: 175 10*3/uL (ref 150–400)
RBC: 4.1 MIL/uL — ABNORMAL LOW (ref 4.22–5.81)
RDW: 17.2 % — ABNORMAL HIGH (ref 11.5–15.5)
WBC: 7 10*3/uL (ref 4.0–10.5)
nRBC: 0 % (ref 0.0–0.2)

## 2020-09-28 LAB — URINALYSIS, COMPLETE (UACMP) WITH MICROSCOPIC
Bacteria, UA: NONE SEEN
Bilirubin Urine: NEGATIVE
Glucose, UA: NEGATIVE mg/dL
Ketones, ur: NEGATIVE mg/dL
Nitrite: NEGATIVE
Protein, ur: 30 mg/dL — AB
RBC / HPF: >50 RBC/hpf — ABNORMAL HIGH (ref 0–5)
Specific Gravity, Urine: 1.017 (ref 1.005–1.030)
Squamous Epithelial / HPF: NONE SEEN (ref 0–5)
pH: 5 (ref 5.0–8.0)

## 2020-09-28 LAB — TROPONIN I (HIGH SENSITIVITY)
Troponin I (High Sensitivity): 21 ng/L — ABNORMAL HIGH (ref <18)
Troponin I (High Sensitivity): 20 ng/L — ABNORMAL HIGH (ref <18)

## 2020-09-28 LAB — LACTIC ACID, PLASMA: Lactic Acid, Venous: 0.8 mmol/L (ref 0.5–1.9)

## 2020-09-28 MED ORDER — ACETAMINOPHEN 325 MG PO TABS: 650.0000 mg | ORAL_TABLET | Freq: Four times a day (QID) | ORAL | Status: DC | PRN

## 2020-09-28 MED ORDER — POTASSIUM CHLORIDE IN NACL 20-0.9 MEQ/L-% IV SOLN
INTRAVENOUS | Status: DC
  Filled 2020-09-28 (×3): qty 1000

## 2020-09-28 MED ORDER — MAGNESIUM HYDROXIDE 400 MG/5ML PO SUSP
30.0000 mL | Freq: Every day | ORAL | Status: DC | PRN
  Filled 2020-09-28: qty 30

## 2020-09-28 MED ORDER — ENOXAPARIN SODIUM 40 MG/0.4ML IJ SOSY
30.0000 mg | PREFILLED_SYRINGE | INTRAMUSCULAR | Status: DC
  Administered 2020-09-29 – 2020-09-30 (×2): 30 mg via SUBCUTANEOUS
  Filled 2020-09-28 (×2): qty 0.4

## 2020-09-28 MED ORDER — PANTOPRAZOLE SODIUM 40 MG PO TBEC
40.0000 mg | DELAYED_RELEASE_TABLET | Freq: Every day | ORAL | Status: DC
  Administered 2020-09-29 – 2020-10-03 (×5): 40 mg via ORAL
  Filled 2020-09-28 (×5): qty 1

## 2020-09-28 MED ORDER — FERROUS SULFATE 325 (65 FE) MG PO TABS
325.0000 mg | ORAL_TABLET | Freq: Every day | ORAL | Status: DC
  Administered 2020-09-29 – 2020-10-03 (×5): 325 mg via ORAL
  Filled 2020-09-28 (×5): qty 1

## 2020-09-28 MED ORDER — SODIUM CHLORIDE 0.9 % IV SOLN
1.0000 g | INTRAVENOUS | Status: DC
  Administered 2020-09-28 – 2020-10-01 (×4): 1 g via INTRAVENOUS
  Filled 2020-09-28 (×5): qty 10

## 2020-09-28 MED ORDER — PRAVASTATIN SODIUM 20 MG PO TABS
10.0000 mg | ORAL_TABLET | Freq: Every day | ORAL | Status: DC
  Administered 2020-09-29 – 2020-10-02 (×2): 10 mg via ORAL
  Filled 2020-09-28 (×3): qty 1

## 2020-09-28 MED ORDER — LIDOCAINE HCL URETHRAL/MUCOSAL 2 % EX GEL
1.0000 "application " | Freq: Once | CUTANEOUS | Status: AC
  Administered 2020-09-28: 1 via URETHRAL
  Filled 2020-09-28: qty 10

## 2020-09-28 MED ORDER — ONDANSETRON HCL 4 MG/2ML IJ SOLN: 4.0000 mg | Freq: Four times a day (QID) | INTRAMUSCULAR | Status: DC | PRN

## 2020-09-28 MED ORDER — SODIUM CHLORIDE 0.9 % IV BOLUS
1000.0000 mL | Freq: Once | INTRAVENOUS | Status: AC
  Administered 2020-09-28: 1000 mL via INTRAVENOUS

## 2020-09-28 MED ORDER — ENOXAPARIN SODIUM 30 MG/0.3ML IJ SOSY
30.0000 mg | PREFILLED_SYRINGE | INTRAMUSCULAR | Status: DC
  Filled 2020-09-28: qty 0.3

## 2020-09-28 MED ORDER — ACETAMINOPHEN 650 MG RE SUPP: 650.0000 mg | Freq: Four times a day (QID) | RECTAL | Status: DC | PRN

## 2020-09-28 MED ORDER — TAMSULOSIN HCL 0.4 MG PO CAPS
0.4000 mg | ORAL_CAPSULE | Freq: Every day | ORAL | Status: DC
  Administered 2020-09-29 – 2020-10-09 (×9): 0.4 mg via ORAL
  Filled 2020-09-28 (×10): qty 1

## 2020-09-28 MED ORDER — TRAZODONE HCL 50 MG PO TABS
25.0000 mg | ORAL_TABLET | Freq: Every evening | ORAL | Status: DC | PRN
  Administered 2020-10-06 – 2020-10-07 (×2): 25 mg via ORAL
  Filled 2020-09-28 (×2): qty 1

## 2020-09-28 MED ORDER — FINASTERIDE 5 MG PO TABS
5.0000 mg | ORAL_TABLET | Freq: Every day | ORAL | Status: DC
  Administered 2020-09-29 – 2020-10-09 (×9): 5 mg via ORAL
  Filled 2020-09-28 (×11): qty 1

## 2020-09-28 MED ORDER — HYDRALAZINE HCL 50 MG PO TABS
25.0000 mg | ORAL_TABLET | Freq: Three times a day (TID) | ORAL | Status: DC
  Administered 2020-09-28 – 2020-10-03 (×12): 25 mg via ORAL
  Filled 2020-09-28 (×12): qty 1

## 2020-09-28 MED ORDER — AMLODIPINE BESYLATE 10 MG PO TABS
10.0000 mg | ORAL_TABLET | Freq: Every day | ORAL | Status: DC
  Administered 2020-09-29 – 2020-10-03 (×5): 10 mg via ORAL
  Filled 2020-09-28 (×5): qty 1

## 2020-09-28 MED ORDER — ONDANSETRON HCL 4 MG PO TABS: 4.0000 mg | ORAL_TABLET | Freq: Four times a day (QID) | ORAL | Status: DC | PRN

## 2020-09-28 NOTE — ED Provider Notes (Signed)
[facility] Emergency Department Provider Note  ____________________________________________   Event Date/Time   First MD Initiated Contact with Patient 09/28/20 1649     (approximate)  I have reviewed the triage vital signs and the nursing notes.   HISTORY  Chief Complaint Altered Mental Status  Level 1 caveat as the patient is unable to provide history.  He is currently obtunded and responds to verbal stimuli.  Report per EMS from nursing facility staff.  HPI Billy Bennett is a 87 y.o. male with below medical history, presents to the ED for evaluation of altered mental status.  Patient, according to staff from [facility], was less active than usual today.  EMS called about an hour prior to arrival for patient being lethargic.  There was no reported syncope or fall resulting in a head injury.  Typically patient is reported as active and engaged," flirts with the nurses."  Past Medical History:  Diagnosis Date   Anxiety    Arthritis    Dementia ([place])    DNR no code (do not resuscitate)    Enlarged prostate    GERD (gastroesophageal reflux disease)    Heart murmur, systolic 18/29/9371   HOH (hard of hearing)    Hypercholesteremia    Hypertension    Other constipation 07/01/2017   Pure hypercholesterolemia 04/01/2011   Skin cancer 08/22/2016   Overview:  Patient sees Dr. Phillip Heal    Patient Active Problem List   Diagnosis Date Noted   AMS (altered mental status) 09/28/2020   UTI (urinary tract infection) 10/19/2019   Hemorrhagic stroke ([place])    Traumatic rhabdomyolysis ([place])    Gastroesophageal reflux disease without esophagitis    Head injury    Acute metabolic encephalopathy 69/67/8938   Hypokalemia 02/27/2019   Elevated troponin 02/27/2019   Fall    At high risk for falls 08/28/2017   Fecal smearing 07/01/2017   Other constipation 07/01/2017   Heart murmur, systolic 01/29/5101   Skin cancer 08/22/2016   Status post arthroscopy  07/16/2016   Pure hypercholesterolemia 04/01/2011   Hypertension 01/20/2011    Past Surgical History:  Procedure Laterality Date   ANAL FISSURE REPAIR     CHOLECYSTECTOMY     EYE SURGERY Bilateral    Cataract Extraction with IOL   KNEE ARTHROSCOPY Right 07/08/2016   Procedure: ARTHROSCOPY KNEE, PARTIAL MEDIAL MENISECTOMY, CHONDROPLASTY;  Surgeon: Dereck Leep, MD;  Location: [facility] ORS;  Service: Orthopedics;  Laterality: Right;    Prior to Admission medications   Medication Sig Start Date End Date Taking? Authorizing Provider  acetaminophen (TYLENOL) 500 MG tablet Take 1,000 mg by mouth in the morning, at noon, and at bedtime.   Yes [provider]  amLODipine (NORVASC) 10 MG tablet Take 1 tablet (10 mg total) by mouth daily. 03/04/19  Yes Fritzi Mandes, MD  ferrous sulfate 325 (65 FE) MG tablet Take 325 mg by mouth daily with breakfast.   Yes [provider]  finasteride (PROSCAR) 5 MG tablet Take 5 mg by mouth daily.   Yes [provider]  gabapentin (NEURONTIN) 300 MG capsule Take 300 mg by mouth at bedtime.   Yes [provider]  hydrALAZINE (APRESOLINE) 25 MG tablet Take 1 tablet (25 mg total) by mouth 3 (three) times daily. 03/03/19  Yes Fritzi Mandes, MD  hydrochlorothiazide (MICROZIDE) 12.5 MG capsule Take 1 capsule (12.5 mg total) by mouth daily. Patient taking differently: Take 25 mg by mouth daily. 03/03/19  Yes Fritzi Mandes,  MD  omeprazole (PRILOSEC) 20 MG capsule Take 20 mg by mouth in the morning and at bedtime.   Yes [provider]  pravastatin (PRAVACHOL) 10 MG tablet Take 10 mg by mouth at bedtime. 09/30/15  Yes [provider]  sertraline (ZOLOFT) 25 MG tablet Take 50 mg by mouth daily. 01/25/19  Yes [provider]  tamsulosin (FLOMAX) 0.4 MG CAPS capsule Take 0.4 mg by mouth.   Yes [provider]  traMADol (ULTRAM) 50 MG tablet Take 25 mg by mouth in the morning, at noon, and at bedtime.   Yes  [provider]  traZODone (DESYREL) 50 MG tablet Take 50 mg by mouth at bedtime.   Yes [provider]  donepezil (ARICEPT) 5 MG tablet Take 5 mg by mouth daily. Patient not taking: No sig reported 01/25/19   [provider]    Allergies Ace inhibitors, Telmisartan-hctz, Amoxicillin-pot clavulanate, and Ibuprofen  Family History  Problem Relation Age of Onset   Alzheimer's disease Father    Prostate cancer Brother     Social History Social History   Tobacco Use   Smoking status: Former    Pack years: 0.00    Types: Cigarettes    Quit date: 04/14/1977    Years since quitting: 43.4   Smokeless tobacco: Never  Vaping Use   Vaping Use: Never used  Substance Use Topics   Alcohol use: No   Drug use: No    Review of Systems  Constitutional: No fever/chills Eyes: No visual changes. ENT: No sore throat. Cardiovascular: Denies chest pain. Respiratory: Denies shortness of breath. Gastrointestinal: No abdominal pain.  No nausea, no vomiting.  No diarrhea.  No constipation. Genitourinary: Negative for dysuria. Musculoskeletal: Negative for back pain. Skin: Negative for rash. Neurological: Negative for headaches, focal weakness or numbness.  ____________________________________________   PHYSICAL EXAM:  VITAL SIGNS: ED Triage Vitals  Enc Vitals Group     BP 09/28/20 1708 120/70     Pulse Rate 09/28/20 1708 (!) 51     Resp 09/28/20 1708 19     Temp 09/28/20 1726 98 F (36.7 C)     Temp Source 09/28/20 1726 Oral     SpO2 09/28/20 1708 95 %     Weight --      Height --      Head Circumference --      Peak Flow --      Pain Score --      Pain Loc --      Pain Edu? --      Excl. in [place]? --     Constitutional: Alert and oriented. Well appearing and in no acute distress. Eyes: Conjunctivae are injected and purulent drainage to the left eye. EOMI. Head: Atraumatic. Nose: No congestion/rhinnorhea. Mouth/Throat: Mucous membranes are moist.   Oropharynx non-erythematous. Neck: No stridor.   Cardiovascular: Normal rate, regular rhythm. Grossly normal heart sounds.  Good peripheral circulation. Respiratory: Normal respiratory effort.  No retractions. Lungs CTAB. Gastrointestinal: Soft and nontender. No distention. No abdominal bruits. No CVA tenderness. Musculoskeletal: No lower extremity tenderness nor edema.  No joint effusions. Neurologic:  Normal speech and language. No gross focal neurologic deficits are appreciated. No gait instability. Skin:  Skin is warm, dry and intact. No rash noted. Psychiatric: Mood and affect are normal. Speech and behavior are normal.  ____________________________________________   LABS (all labs ordered are listed, but only abnormal results are displayed)  Labs Reviewed  CBC WITH DIFFERENTIAL/PLATELET - Abnormal; Notable for  the following components:      Result Value   RBC 4.10 (*)    Hemoglobin 12.3 (*)    HCT 38.6 (*)    RDW 17.2 (*)    Abs Immature Granulocytes 0.08 (*)    All other components within normal limits  BASIC METABOLIC PANEL - Abnormal; Notable for the following components:   Sodium 146 (*)    Chloride 113 (*)    Glucose, Bld 101 (*)    BUN 46 (*)    Creatinine, Ser 1.50 (*)    GFR, Estimated 43 (*)    All other components within normal limits  URINALYSIS, COMPLETE (UACMP) WITH MICROSCOPIC - Abnormal; Notable for the following components:   Color, Urine YELLOW (*)    APPearance CLEAR (*)    Hgb urine dipstick LARGE (*)    Protein, ur 30 (*)    Leukocytes,Ua TRACE (*)    RBC / HPF >50 (*)    All other components within normal limits  TROPONIN I (HIGH SENSITIVITY) - Abnormal; Notable for the following components:   Troponin I (High Sensitivity) 21 (*)    All other components within normal limits  TROPONIN I (HIGH SENSITIVITY) - Abnormal; Notable for the following components:   Troponin I (High Sensitivity) 20 (*)    All other components within normal limits  RESP  PANEL BY RT-PCR (FLU A&B, COVID) ARPGX2  URINE CULTURE  LACTIC ACID, PLASMA  URINE DRUG SCREEN, QUALITATIVE ([facility] ONLY)   ____________________________________________  EKG  Vent. rate 61 BPM PR interval 187 ms QRS duration 88 ms QT/QTcB 447/451 ms P-R-T axes 49 41 102 PVC noted ____________________________________________  RADIOLOGY I, Melvenia Needles, personally viewed and evaluated these images (plain radiographs) as part of my medical decision making, as well as reviewing the written report by the radiologist.  ED MD interpretation:  agree with reports  Official radiology report(s): DG Chest 1 View  Result Date: 09/28/2020 CLINICAL DATA:  Altered mental status EXAM: CHEST  1 VIEW COMPARISON:  07/22/2020 FINDINGS: Low lung volumes. Enlarged cardiomediastinal silhouette with aortic atherosclerosis. No focal airspace disease, pleural effusion, or pneumothorax. IMPRESSION: No active disease.  Cardiomegaly Electronically Signed   By: Donavan Foil M.D.   On: 09/28/2020 17:45   CT Head Wo Contrast  Result Date: 09/28/2020 CLINICAL DATA:  Altered mental status.  No reported injury. EXAM: CT HEAD WITHOUT CONTRAST TECHNIQUE: Contiguous axial images were obtained from the base of the skull through the vertex without intravenous contrast. COMPARISON:  07/22/2020 head CT. FINDINGS: Brain: No evidence of parenchymal hemorrhage or extra-axial fluid collection. No mass lesion, mass effect, or midline shift. No CT evidence of acute infarction. Generalized cerebral volume loss. Nonspecific mild subcortical and periventricular white matter hypodensity, most in keeping with chronic small vessel ischemic change. Cerebral ventricle sizes are stable and concordant with the degree of cerebral volume loss. Vascular: No acute abnormality. Skull: No evidence of calvarial fracture. Sinuses/Orbits: No fluid levels. Chronic mucoperiosteal thickening in the right ethmoidal air cells. Other:  The mastoid air  cells are unopacified. IMPRESSION: 1. No evidence of acute intracranial abnormality. 2. Generalized cerebral volume loss and mild chronic small vessel ischemic changes in the cerebral white matter. 3. Chronic right ethmoidal sinusitis. Electronically Signed   By: Ilona Sorrel M.D.   On: 09/28/2020 17:56    ____________________________________________   PROCEDURES  Procedure(s) performed (including Critical Care):  Procedures  NS 1000 ml bolus ____________________________________________   INITIAL IMPRESSION / ASSESSMENT AND PLAN / ED  COURSE  As part of my medical decision making, I reviewed the following data within the [place] reviewed baseline elevated trop, EKG interpreted NSR, Old chart reviewed, Radiograph reviewed NAD, Notes from prior ED visits, and [place] Controlled Substance Database    Differential diagnosis includes, but is not limited to, alcohol, illicit or prescription medications, or other toxic ingestion; intracranial pathology such as stroke or intracerebral hemorrhage; fever or infectious causes including sepsis; hypoxemia and/or hypercarbia; uremia; trauma; endocrine related disorders such as diabetes, hypoglycemia, and thyroid-related diseases; hypertensive encephalopathy; etc.   Geriatric patient who is brought into the ED from his assisted living facility after he with noted to be altered and less responsive than typical.  Minimal history is provided at this time.  Patient otherwise has been stable without fever, tachycardia, or any signs of source of infection.  Baseline troponin is elevated as per usual.  No CT evidence of any intracranial process or acute bleed, and no chest x-ray indication of any acute infectious process.  Patient will be admitted to the hospital service for further evaluation of his altered mental status.  ----------------------------------------- 7:59 PM on 09/28/2020 ----------------------------------------- S/W Dr.  Sidney Ace: He will evaluate the patient, and start him on empiric course of Rocephin for presumed UTI.  Patient will be be admitted to the hospital service for further evaluation and management. ____________________________________________   FINAL CLINICAL IMPRESSION(S) / ED DIAGNOSES  Final diagnoses:  AMS (altered mental status)     ED Discharge Orders     None        Note:  This document was prepared using Dragon voice recognition software and may include unintentional dictation errors.    Melvenia Needles, PA-C 09/28/20 1959    Vladimir Crofts, MD 09/28/20 [phone]

## 2020-09-28 NOTE — ED Triage Notes (Signed)
Pt via EMS from [facility], per staff pt is not acting like himself. Pt was not responding and pt is usually alert and talking. On arrival, pt was only responsive to verbal stimuli.

## 2020-09-28 NOTE — H&P (Signed)
[place]   PATIENT NAME: Billy Bennett    MR#:  [id number]  DATE OF BIRTH:  [birth year]  DATE OF ADMISSION:  09/28/2020  PRIMARY CARE PHYSICIAN: Gayland Curry, MD   Patient is coming from: [facility]F  REQUESTING/REFERRING PHYSICIAN: Melvenia Needles, PA-C   CHIEF COMPLAINT:  [place] Dannielle Karvonen, PA-C Chief Complaint  Patient presents with   Altered Mental Status    HISTORY OF PRESENT ILLNESS:  Billy Bennett. is a 87 y.o. Caucasian male with medical history significant for advanced dementia, hypertension, dyslipidemia, BPH, anxiety and osteoarthritis, who presented to the emergency room from [facility], with acute onset of altered mental status with decreased activity as well as lethargy for which EMS was called.  No reported syncope or fall or head injury.  The patient is usually active and engaged at his baseline.  No reported chest pain or palpitations.  No reported cough or wheezing or dyspnea.  The patient was very somnolent but arousable.  He was overall a very poor historian and therefore the much history could be obtained from him.  ED Course: When the patient came to the ER heart rate was 57 with otherwise normal vital signs.  Labs revealed a BUN of 46 and creatinine 1.5 compared to 36/1.33 with lactic acid of 0.8.  CBC showed mild anemia and urinalysis showed 11-20 WBCs and more than 50 RBCs.  There were trace leukocytes and negative nitrites. EKG as reviewed by me : Showed sinus rhythm with a rate of 61 with PVCs Imaging: Chest x-ray showed cardiomegaly with no acute cardiopulmonary disease. Noncontrasted head CT scan showed chronic right ethmoid sinusitis, generalized cerebral volume loss and mild chronic small vessel ischemic changes in the cerebral white matter with no evidence for acute intracranial normality.  The patient was given 1 L bolus of IV normal saline.  Urine culture was ordered as well as 1 g of IV Rocephin.  He will  be admitted to a medical monitored bed for further evaluation and management. PAST MEDICAL HISTORY:   Past Medical History:  Diagnosis Date   Anxiety    Arthritis    Dementia ([place])    DNR no code (do not resuscitate)    Enlarged prostate    GERD (gastroesophageal reflux disease)    Heart murmur, systolic 44/06/4740   HOH (hard of hearing)    Hypercholesteremia    Hypertension    Other constipation 07/01/2017   Pure hypercholesterolemia 04/01/2011   Skin cancer 08/22/2016   Overview:  Patient sees Dr. Phillip Heal    PAST SURGICAL HISTORY:   Past Surgical History:  Procedure Laterality Date   ANAL FISSURE REPAIR     CHOLECYSTECTOMY     EYE SURGERY Bilateral    Cataract Extraction with IOL   KNEE ARTHROSCOPY Right 07/08/2016   Procedure: ARTHROSCOPY KNEE, PARTIAL MEDIAL MENISECTOMY, CHONDROPLASTY;  Surgeon: Dereck Leep, MD;  Location: [facility] ORS;  Service: Orthopedics;  Laterality: Right;    SOCIAL HISTORY:   Social History   Tobacco Use   Smoking status: Former    Pack years: 0.00    Types: Cigarettes    Quit date: 04/14/1977    Years since quitting: 43.4   Smokeless tobacco: Never  Substance Use Topics   Alcohol use: No    FAMILY HISTORY:   Family History  Problem Relation Age of Onset   Alzheimer's disease Father    Prostate cancer Brother  DRUG ALLERGIES:   Allergies  Allergen Reactions   Ace Inhibitors Cough   Telmisartan-Hctz Cough    Other Reaction: ARBS - COUGH   Amoxicillin-Pot Clavulanate Diarrhea    Has patient had a PCN reaction causing immediate rash, facial/tongue/throat swelling, SOB or lightheadedness with hypotension:unsure Has patient had a PCN reaction causing severe rash involving mucus membranes or skin necrosis:unsure Has patient had a PCN reaction that required hospitalization:unsure  Has patient had a PCN reaction occurring within the last 10 years:Yes If all of the above answers are "NO", then may proceed with Cephalosporin use.     Ibuprofen Other (See Comments)    Ankle swelling ( can take naprosyn)    REVIEW OF SYSTEMS:   ROS As per history of present illness, otherwise unobtainable due to the patient's dementia and altered mental status. MEDICATIONS AT HOME:   Prior to Admission medications   Medication Sig Start Date End Date Taking? Authorizing Provider  acetaminophen (TYLENOL) 500 MG tablet Take 1,000 mg by mouth in the morning, at noon, and at bedtime.   Yes [provider]  amLODipine (NORVASC) 10 MG tablet Take 1 tablet (10 mg total) by mouth daily. 03/04/19  Yes Fritzi Mandes, MD  ferrous sulfate 325 (65 FE) MG tablet Take 325 mg by mouth daily with breakfast.   Yes [provider]  finasteride (PROSCAR) 5 MG tablet Take 5 mg by mouth daily.   Yes [provider]  gabapentin (NEURONTIN) 300 MG capsule Take 300 mg by mouth at bedtime.   Yes [provider]  hydrALAZINE (APRESOLINE) 25 MG tablet Take 1 tablet (25 mg total) by mouth 3 (three) times daily. 03/03/19  Yes Fritzi Mandes, MD  hydrochlorothiazide (MICROZIDE) 12.5 MG capsule Take 1 capsule (12.5 mg total) by mouth daily. Patient taking differently: Take 25 mg by mouth daily. 03/03/19  Yes Fritzi Mandes, MD  omeprazole (PRILOSEC) 20 MG capsule Take 20 mg by mouth in the morning and at bedtime.   Yes [provider]  pravastatin (PRAVACHOL) 10 MG tablet Take 10 mg by mouth at bedtime. 09/30/15  Yes [provider]  sertraline (ZOLOFT) 25 MG tablet Take 50 mg by mouth daily. 01/25/19  Yes [provider]  tamsulosin (FLOMAX) 0.4 MG CAPS capsule Take 0.4 mg by mouth.   Yes [provider]  traMADol (ULTRAM) 50 MG tablet Take 25 mg by mouth in the morning, at noon, and at bedtime.   Yes [provider]  traZODone (DESYREL) 50 MG tablet Take 50 mg by mouth at bedtime.   Yes [provider]  donepezil (ARICEPT) 5 MG tablet Take 5 mg by mouth daily. Patient not taking: No  sig reported 01/25/19   [provider]      VITAL SIGNS:  Blood pressure 96/69, pulse 65, temperature 98 F (36.7 C), temperature source Oral, resp. rate 16, height 5\' 5"  (1.651 m), weight 61.2 kg, SpO2 94 %.  PHYSICAL EXAMINATION:  Physical Exam  GENERAL:  87 y.o.-year-old patient lying in the bed with no acute distress.  EYES: Pupils equal, round, reactive to light and accommodation. No scleral icterus.  He has left lower erythematous eyelid edge consistent with blepharitis.  Extraocular muscles intact.  HEENT: Head atraumatic, normocephalic. Oropharynx and nasopharynx clear.  NECK:  Supple, no jugular venous distention. No thyroid enlargement, no tenderness.  LUNGS: Normal breath sounds bilaterally, no wheezing, rales,rhonchi or crepitation. No use of accessory muscles of respiration.  CARDIOVASCULAR: Regular rate and rhythm, S1, S2  normal. No murmurs, rubs, or gallops.  ABDOMEN: Soft, nondistended, nontender. Bowel sounds present. No organomegaly or mass.  EXTREMITIES: No pedal edema, cyanosis, or clubbing.  NEUROLOGIC: Cranial nerves II through XII are intact. Muscle strength 5/5 in all extremities. Sensation intact. Gait not checked.  PSYCHIATRIC: The patient is somnolent but arousable..  No good eye contact.   SKIN: No obvious rash, lesion, or ulcer.   LABORATORY PANEL:   CBC Recent Labs  Lab 09/28/20 1700  WBC 7.0  HGB 12.3*  HCT 38.6*  PLT 175   ------------------------------------------------------------------------------------------------------------------  Chemistries  Recent Labs  Lab 09/28/20 1700  NA 146*  K 3.7  CL 113*  CO2 23  GLUCOSE 101*  BUN 46*  CREATININE 1.50*  CALCIUM 9.0   ------------------------------------------------------------------------------------------------------------------  Cardiac Enzymes No results for input(s): TROPONINI in the last 168  hours. ------------------------------------------------------------------------------------------------------------------  RADIOLOGY:  DG Chest 1 View  Result Date: 09/28/2020 CLINICAL DATA:  Altered mental status EXAM: CHEST  1 VIEW COMPARISON:  07/22/2020 FINDINGS: Low lung volumes. Enlarged cardiomediastinal silhouette with aortic atherosclerosis. No focal airspace disease, pleural effusion, or pneumothorax. IMPRESSION: No active disease.  Cardiomegaly Electronically Signed   By: Donavan Foil M.D.   On: 09/28/2020 17:45   CT Head Wo Contrast  Result Date: 09/28/2020 CLINICAL DATA:  Altered mental status.  No reported injury. EXAM: CT HEAD WITHOUT CONTRAST TECHNIQUE: Contiguous axial images were obtained from the base of the skull through the vertex without intravenous contrast. COMPARISON:  07/22/2020 head CT. FINDINGS: Brain: No evidence of parenchymal hemorrhage or extra-axial fluid collection. No mass lesion, mass effect, or midline shift. No CT evidence of acute infarction. Generalized cerebral volume loss. Nonspecific mild subcortical and periventricular white matter hypodensity, most in keeping with chronic small vessel ischemic change. Cerebral ventricle sizes are stable and concordant with the degree of cerebral volume loss. Vascular: No acute abnormality. Skull: No evidence of calvarial fracture. Sinuses/Orbits: No fluid levels. Chronic mucoperiosteal thickening in the right ethmoidal air cells. Other:  The mastoid air cells are unopacified. IMPRESSION: 1. No evidence of acute intracranial abnormality. 2. Generalized cerebral volume loss and mild chronic small vessel ischemic changes in the cerebral white matter. 3. Chronic right ethmoidal sinusitis. Electronically Signed   By: Ilona Sorrel M.D.   On: 09/28/2020 17:56      IMPRESSION AND PLAN:  Active Problems:   AMS (altered mental status)  1.  Acute metabolic cephalopathy, possibly secondary to UTI and acute kidney injury. - The  patient will be admitted to a medical monitored bed. - We will obtain a urine culture and place the patient on IV Rocephin. - The patient will be hydrated with IV normal saline and will follow BMP. - We will hold off nephrotoxins. - We will avoid sedatives. - Neurochecks will be obtained every 4 hours for 24 hours.  2.  Essential hypertension. - We will continue Norvasc and hydralazine and hold off HCTZ given acute kidney injury.  3.  BPH. - We will continue Flomax and Proscar.  4.  GERD. - We will continue PPI therapy.  5.  Dyslipidemia. - We will continue statin therapy.  4.  Depression. - We will hold off Zoloft and trazodone.  7.  Peripheral neuropathy. - We will hold off Neurontin given his altered mental status with somnolence.   DVT prophylaxis: Lovenox. Code Status: The patient is DNR/DNI. Family Communication:  The plan of care was discussed in details with the patient (and family). I answered all questions. The patient  agreed to proceed with the above mentioned plan. Further management will depend upon hospital course. Disposition Plan: Back to previous home environment Consults called: none. All the records are reviewed and case discussed with ED provider.  Status is: Inpatient  Remains inpatient appropriate because:Altered mental status, Ongoing diagnostic testing needed not appropriate for outpatient work up, Unsafe d/c plan, IV treatments appropriate due to intensity of illness or inability to take PO, and Inpatient level of care appropriate due to severity of illness  Dispo: The patient is from: SNF              Anticipated d/c is to: SNF              Patient currently is not medically stable to d/c.   Difficult to place patient No    TOTAL TIME TAKING CARE OF THIS PATIENT: 55 minutes.    Christel Mormon M.D on 09/28/2020 at 8:03 PM  [facility]   From 7 PM-7 AM, contact night-coverage [email or web address]  CC: Primary care physician; Gayland Curry, MD

## 2020-09-28 NOTE — Progress Notes (Signed)
Skin swarm completed with Magdalen Spatz and Dorthula Matas. Stage 2 to Right buttock, bilateral upper and lower abrasions and bruises, abrasion left face, blanchable redness to bilateral heels. Foam dressings placed for prevention.

## 2020-09-29 ENCOUNTER — Inpatient Hospital Stay: Payer: Medicare Other

## 2020-09-29 DIAGNOSIS — L8990 Pressure ulcer of unspecified site, unspecified stage: Secondary | ICD-10-CM | POA: Insufficient documentation

## 2020-09-29 DIAGNOSIS — E870 Hyperosmolality and hypernatremia: Secondary | ICD-10-CM

## 2020-09-29 DIAGNOSIS — R4182 Altered mental status, unspecified: Secondary | ICD-10-CM

## 2020-09-29 DIAGNOSIS — F0391 Unspecified dementia with behavioral disturbance: Secondary | ICD-10-CM

## 2020-09-29 LAB — CBC
HCT: 35.4 % — ABNORMAL LOW (ref 39.0–52.0)
Hemoglobin: 11.5 g/dL — ABNORMAL LOW (ref 13.0–17.0)
MCH: 30.3 pg (ref 26.0–34.0)
MCHC: 32.5 g/dL (ref 30.0–36.0)
MCV: 93.2 fL (ref 80.0–100.0)
Platelets: 171 10*3/uL (ref 150–400)
RBC: 3.8 MIL/uL — ABNORMAL LOW (ref 4.22–5.81)
RDW: 17.2 % — ABNORMAL HIGH (ref 11.5–15.5)
WBC: 5.3 10*3/uL (ref 4.0–10.5)
nRBC: 0 % (ref 0.0–0.2)

## 2020-09-29 LAB — BASIC METABOLIC PANEL
Anion gap: 10 (ref 5–15)
BUN: 43 mg/dL — ABNORMAL HIGH (ref 8–23)
CO2: 22 mmol/L (ref 22–32)
Calcium: 8.7 mg/dL — ABNORMAL LOW (ref 8.9–10.3)
Chloride: 119 mmol/L — ABNORMAL HIGH (ref 98–111)
Creatinine, Ser: 1.33 mg/dL — ABNORMAL HIGH (ref 0.61–1.24)
GFR, Estimated: 50 mL/min — ABNORMAL LOW (ref >60)
Glucose, Bld: 61 mg/dL — ABNORMAL LOW (ref 70–99)
Potassium: 4 mmol/L (ref 3.5–5.1)
Sodium: 151 mmol/L — ABNORMAL HIGH (ref 135–145)

## 2020-09-29 MED ORDER — DEXTROSE 5 % IV SOLN: INTRAVENOUS | Status: DC

## 2020-09-29 NOTE — Progress Notes (Addendum)
PROGRESS NOTE    Billy Bennett.  POE:[id number] DOB: [birth year] DOA: 09/28/2020 PCP: Gayland Curry, MD    Assessment & Plan:   Active Problems:   AMS (altered mental status)   Pressure injury of skin  Acute metabolic encephalopathy: etiology unclear, possibly secondary to UTI and/or hypernatremia. Urine cx is pending. Continue on IV rocephin  Possible UTI: urine cx is pending. Continue on IV rocephin  Likely CKD: baseline Cr/GFR is unknown. Currently stage IIIa. Cr is trending down from day prior  Hypernatremia: free water deficit is 2.3L. Changed IVF to D5W  Likely dysphagia: speech consulted   Dementia: advanced. Continue w/ supportive care   HTN: continue on home dose of amlodipine, hydralazine. Continue to hold home dose of HCTZ  BPH: continue on home dose of flomax, proscar  GERD: continue on PPI   HLD: continue on statin   Peripheral neuropathy: will hold home dose of gabapentin secondary AMS   DVT prophylaxis: lovenox  Code Status: full  Family Communication: called pt's nephew, Delfino Lovett, no answer but I left a message. Talked to pt's friend, Marcello Moores, via phone.  Disposition Plan: depends on PT/OT recs   Level of care: Med-Surg   Status is: Inpatient  Remains inpatient appropriate because:Unsafe d/c plan, IV treatments appropriate due to intensity of illness or inability to take PO, and Inpatient level of care appropriate due to severity of illness  Dispo: The patient is from: SNF              Anticipated d/c is to: SNF              Patient currently is not medically stable to d/c.   Difficult to place patient : unclear    Consultants:    Procedures:   Antimicrobials: rocephin   Subjective: Pt is confused and oriented to self only   Objective: Vitals:   09/28/20 1915 09/28/20 2119 09/28/20 2331 09/29/20 0634  BP: 96/69 (!) 151/78 135/61 130/62  Pulse: 65 70 73 88  Resp: 16 18  16   Temp:   (!) 97.4 F (36.3 C) 98.5 F (36.9 C)   TempSrc:   Axillary Oral  SpO2: 94% 98% 97% 94%  Weight:   59.8 kg   Height:   5\' 5"  (1.651 m)     Intake/Output Summary (Last 24 hours) at 09/29/2020 0739 Last data filed at 09/29/2020 0634 Gross per 24 hour  Intake 1533.18 ml  Output 150 ml  Net 1383.18 ml   Filed Weights   09/28/20 1730 09/28/20 2331  Weight: 61.2 kg 59.8 kg    Examination:  General exam: Appears calm and comfortable  Respiratory system: Clear to auscultation. Respiratory effort normal. Cardiovascular system: S1 & S2+. No  rubs, gallops or clicks. Gastrointestinal system: Abdomen is nondistended, soft and nontender. Normal bowel sounds heard. Central nervous system: Alert and oriented to person only. Moves all extremities Psychiatry: Judgement and insight appear abnormal. Flat mood and affect     Data Reviewed: I have personally reviewed following labs and imaging studies  CBC: Recent Labs  Lab 09/28/20 1700 09/29/20 0414  WBC 7.0 5.3  NEUTROABS 5.5  --   HGB 12.3* 11.5*  HCT 38.6* 35.4*  MCV 94.1 93.2  PLT 175 315   Basic Metabolic Panel: Recent Labs  Lab 09/28/20 1700 09/29/20 0414  NA 146* 151*  K 3.7 4.0  CL 113* 119*  CO2 23 22  GLUCOSE 101* 61*  BUN 46* 43*  CREATININE 1.50* 1.33*  CALCIUM 9.0 8.7*   GFR: Estimated Creatinine Clearance: 28.7 mL/min (A) (by C-G formula based on SCr of 1.33 mg/dL (H)). Liver Function Tests: No results for input(s): AST, ALT, ALKPHOS, BILITOT, PROT, ALBUMIN in the last 168 hours. No results for input(s): LIPASE, AMYLASE in the last 168 hours. No results for input(s): AMMONIA in the last 168 hours. Coagulation Profile: No results for input(s): INR, PROTIME in the last 168 hours. Cardiac Enzymes: No results for input(s): CKTOTAL, CKMB, CKMBINDEX, TROPONINI in the last 168 hours. BNP (last 3 results) No results for input(s): PROBNP in the last 8760 hours. HbA1C: No results for input(s): HGBA1C in the last 72 hours. CBG: No results for  input(s): GLUCAP in the last 168 hours. Lipid Profile: No results for input(s): CHOL, HDL, LDLCALC, TRIG, CHOLHDL, LDLDIRECT in the last 72 hours. Thyroid Function Tests: No results for input(s): TSH, T4TOTAL, FREET4, T3FREE, THYROIDAB in the last 72 hours. Anemia Panel: No results for input(s): VITAMINB12, FOLATE, FERRITIN, TIBC, IRON, RETICCTPCT in the last 72 hours. Sepsis Labs: Recent Labs  Lab 09/28/20 1703  LATICACIDVEN 0.8    Recent Results (from the past 240 hour(s))  Resp Panel by RT-PCR (Flu A&B, Covid) Nasopharyngeal Swab     Status: None   Collection Time: 09/28/20  6:58 PM   Specimen: Nasopharyngeal Swab; Nasopharyngeal(NP) swabs in vial transport medium  Result Value Ref Range Status   SARS Coronavirus 2 by RT PCR NEGATIVE NEGATIVE Final    Comment: (NOTE) SARS-CoV-2 target nucleic acids are NOT DETECTED.  The SARS-CoV-2 RNA is generally detectable in upper respiratory specimens during the acute phase of infection. The lowest concentration of SARS-CoV-2 viral copies this assay can detect is 138 copies/mL. A negative result does not preclude SARS-Cov-2 infection and should not be used as the sole basis for treatment or other patient management decisions. A negative result may occur with  improper specimen collection/handling, submission of specimen other than nasopharyngeal swab, presence of viral mutation(s) within the areas targeted by this assay, and inadequate number of viral copies(<138 copies/mL). A negative result must be combined with clinical observations, patient history, and epidemiological information. The expected result is Negative.  Fact Sheet for Patients:  EntrepreneurPulse.com.au  Fact Sheet for Healthcare Providers:  IncredibleEmployment.be  This test is no t yet approved or cleared by the [place] FDA and  has been authorized for detection and/or diagnosis of SARS-CoV-2 by FDA under an Emergency Use  Authorization (EUA). This EUA will remain  in effect (meaning this test can be used) for the duration of the COVID-19 declaration under Section 564(b)(1) of the Act, 21 U.S.C.section 360bbb-3(b)(1), unless the authorization is terminated  or revoked sooner.       Influenza A by PCR NEGATIVE NEGATIVE Final   Influenza B by PCR NEGATIVE NEGATIVE Final    Comment: (NOTE) The Xpert Xpress SARS-CoV-2/FLU/RSV plus assay is intended as an aid in the diagnosis of influenza from Nasopharyngeal swab specimens and should not be used as a sole basis for treatment. Nasal washings and aspirates are unacceptable for Xpert Xpress SARS-CoV-2/FLU/RSV testing.  Fact Sheet for Patients: EntrepreneurPulse.com.au  Fact Sheet for Healthcare Providers: IncredibleEmployment.be  This test is not yet approved or cleared by the [place] FDA and has been authorized for detection and/or diagnosis of SARS-CoV-2 by FDA under an Emergency Use Authorization (EUA). This EUA will remain in effect (meaning this test can be used) for the duration of the COVID-19 declaration under Section 564(b)(1) of the Act, 21 U.S.C.  section 360bbb-3(b)(1), unless the authorization is terminated or revoked.  Performed at [facility], [address], [place], [place] [address]          Radiology Studies: DG Chest 1 View  Result Date: 09/28/2020 CLINICAL DATA:  Altered mental status EXAM: CHEST  1 VIEW COMPARISON:  07/22/2020 FINDINGS: Low lung volumes. Enlarged cardiomediastinal silhouette with aortic atherosclerosis. No focal airspace disease, pleural effusion, or pneumothorax. IMPRESSION: No active disease.  Cardiomegaly Electronically Signed   By: Donavan Foil M.D.   On: 09/28/2020 17:45   CT Head Wo Contrast  Result Date: 09/28/2020 CLINICAL DATA:  Altered mental status.  No reported injury. EXAM: CT HEAD WITHOUT CONTRAST TECHNIQUE: Contiguous axial images were  obtained from the base of the skull through the vertex without intravenous contrast. COMPARISON:  07/22/2020 head CT. FINDINGS: Brain: No evidence of parenchymal hemorrhage or extra-axial fluid collection. No mass lesion, mass effect, or midline shift. No CT evidence of acute infarction. Generalized cerebral volume loss. Nonspecific mild subcortical and periventricular white matter hypodensity, most in keeping with chronic small vessel ischemic change. Cerebral ventricle sizes are stable and concordant with the degree of cerebral volume loss. Vascular: No acute abnormality. Skull: No evidence of calvarial fracture. Sinuses/Orbits: No fluid levels. Chronic mucoperiosteal thickening in the right ethmoidal air cells. Other:  The mastoid air cells are unopacified. IMPRESSION: 1. No evidence of acute intracranial abnormality. 2. Generalized cerebral volume loss and mild chronic small vessel ischemic changes in the cerebral white matter. 3. Chronic right ethmoidal sinusitis. Electronically Signed   By: Ilona Sorrel M.D.   On: 09/28/2020 17:56        Scheduled Meds:  amLODipine  10 mg Oral Daily   enoxaparin (LOVENOX) injection  30 mg Subcutaneous Q24H   ferrous sulfate  325 mg Oral Q breakfast   finasteride  5 mg Oral Daily   hydrALAZINE  25 mg Oral TID   pantoprazole  40 mg Oral Daily   pravastatin  10 mg Oral QHS   tamsulosin  0.4 mg Oral Daily   Continuous Infusions:  cefTRIAXone (ROCEPHIN)  IV Stopped (09/28/20 2327)   dextrose       LOS: 1 day    Time spent: 57 mi     Wyvonnia Dusky, MD [facility] Pager [phone]  If 7PM-7AM, please contact night-coverage 09/29/2020, 7:39 AM

## 2020-09-29 NOTE — TOC Progression Note (Signed)
Transition of Care (TOC) - Progression Note    Patient Details  Name: Billy Bennett. MRN: [id number] Date of Birth: 1923/05/07  Transition of Care [facility]) CM/SW Contact  Pete Pelt, RN Phone Number: 09/29/2020, 3:41 PM  Clinical Narrative:   Patient is a resident of [facility], the facility states to please call them prior to discharge in order for them to assess him to be able to return to facility.    Patient has a power of attorney, Billy Bennett states they are amenable to patient returning to facility on discharge as appropriate.  TOC contact information given, TOC to follow through discharge.         Expected Discharge Plan and Services                                                 Social Determinants of Health (SDOH) Interventions    Readmission Risk Interventions No flowsheet data found.

## 2020-09-29 NOTE — Plan of Care (Signed)
End of Shift Summary:  Alert, speech inaudible difficult to determine orientation. Denies pain. Mouth care done as needed. Q2T as allowed. IVF maintained.  IV abx per MAR. Q4 neuro checks. Remained free from falls or injury. Bed low and in locked position. Frequent rounding to assess for needs.   Problem: Education: Goal: Knowledge of General Education information will improve Description: Including pain rating scale, medication(s)/side effects and non-pharmacologic comfort measures Outcome: Progressing   Problem: Health Behavior/Discharge Planning: Goal: Ability to manage health-related needs will improve Outcome: Progressing   Problem: Clinical Measurements: Goal: Ability to maintain clinical measurements within normal limits will improve Outcome: Progressing Goal: Will remain free from infection Outcome: Progressing Goal: Diagnostic test results will improve Outcome: Progressing Goal: Respiratory complications will improve Outcome: Progressing Goal: Cardiovascular complication will be avoided Outcome: Progressing   Problem: Nutrition: Goal: Adequate nutrition will be maintained Outcome: Progressing   Problem: Coping: Goal: Level of anxiety will decrease Outcome: Progressing   Problem: Elimination: Goal: Will not experience complications related to bowel motility Outcome: Progressing Goal: Will not experience complications related to urinary retention Outcome: Progressing   Problem: Pain Managment: Goal: General experience of comfort will improve Outcome: Progressing   Problem: Safety: Goal: Ability to remain free from injury will improve Outcome: Progressing   Problem: Skin Integrity: Goal: Risk for impaired skin integrity will decrease Outcome: Progressing

## 2020-09-29 NOTE — Evaluation (Signed)
Clinical/Bedside Swallow Evaluation Patient Details  Name: Billy Bennett. MRN: [id number] Date of Birth: 1923/08/15  Today's Date: 09/29/2020 Time: SLP Start Time (ACUTE ONLY): 1015 SLP Stop Time (ACUTE ONLY): 1105 SLP Time Calculation (min) (ACUTE ONLY): 50 min  Past Medical History:  Past Medical History:  Diagnosis Date   Anxiety    Arthritis    Dementia ([place])    DNR no code (do not resuscitate)    Enlarged prostate    GERD (gastroesophageal reflux disease)    Heart murmur, systolic 14/97/0263   HOH (hard of hearing)    Hypercholesteremia    Hypertension    Other constipation 07/01/2017   Pure hypercholesterolemia 04/01/2011   Skin cancer 08/22/2016   Overview:  Patient sees Dr. Phillip Heal   Past Surgical History:  Past Surgical History:  Procedure Laterality Date   ANAL FISSURE REPAIR     CHOLECYSTECTOMY     EYE SURGERY Bilateral    Cataract Extraction with IOL   KNEE ARTHROSCOPY Right 07/08/2016   Procedure: ARTHROSCOPY KNEE, PARTIAL MEDIAL MENISECTOMY, CHONDROPLASTY;  Surgeon: Dereck Leep, MD;  Location: [facility] ORS;  Service: Orthopedics;  Laterality: Right;   HPI:  Pt is a 87 y.o. Caucasian male with medical history significant for Advanced Dementia, hypertension, dyslipidemia, BPH, anxiety, GERD, HOH, and osteoarthritis, who presented to the emergency room from [facility], with acute onset of altered mental status with decreased activity as well as lethargy. Pt being treated for acute metabolic encephalopathy possibly 2/2 to UTI & AKI.  Head CT: no acute changes.  CXR: No active disease. Cardiomegaly.   Assessment / Plan / Recommendation Clinical Impression  Pt appears to present w/ oropharyngeal phase dysphagia in light of Significantly declined Cognitive status; Advanced Dementia at his Baseline per chart. Per chart notes, pt has been seen by this service previously and a Minced foods diet was rec'd then in 2020. Advanced Cognitive decline impacts his overall  awareness/engagement and safety during po tasks which increases risk for aspiration, choking. Pt's risk for aspiration, as well as meeting nutritional needs fully, is present but can be reduced when following general aspiration precautions, given feeding assistance, and using a modified diet consistency. He required Max tactile/verbal/ visual cues for orientation to bolus presentation, and during feeding support(he often maintained closed mouth not opening fully for TSP bolus presentation; and mumbled speech during the eval).   Pt consumed trials of ice chip, purees, and TSP/Cup sips of thin and Nectar liquids. Overt coughing (delayed/immediate) noted w/ thin liquids -- suspect delayed pharyngeal swallow initiation; impact from Cognitive decline. No overt clinical s/s of aspiration noted w/ trials of single ice chips(he masticated softly), Nectar liquids, and purees -- no decline in vocal quality during few phonations, no cough, and no decline in respiratory status during/post trials. Oral phase grossly was adequate for bolus management and oral clearing of the boluses given though overall decreased awareness in bolus prep noted. OM Exam was cursory/observation, but No unilateral weakness noted. Pt appeared confused w/ attempted oral care and bit on the swab stick.    D/t pt's declined Cognitive status, and his risk for aspiration, recommend initiation of the dysphagia level 1(puree) w/ Nectar liquids; aspiration precautions; reduce Distractions during meals and only give po's when pt is alert. Pills Crushed in Puree for safer swallowing. Support w/ feeding at meals -- check for oral clearing during/post intake. NSG updated. GERD precautions. ST services recommends follow w/ Palliative Care for [place] at discharge to discuss the  impact of Dementia and swallowing w/ family, pt. Largely suspect that pt's Advanced Dementia could hamper oral intake, meeting hydration needs, and upgrade of diet. Precautions posted in  room. SLP Visit Diagnosis: Dysphagia, oropharyngeal phase (R13.12) (advanced Dementia)    Aspiration Risk  Moderate aspiration risk;Risk for inadequate nutrition/hydration    Diet Recommendation  Dysphagia level 1(puree) w/ Nectar liquids; aspiration precautions; reduce Distractions during meals and only give po's when pt is alert. Support w/ feeding at meals -- check for oral clearing during/post intake. GERD precautions.  Medication Administration: Crushed with puree (for safer swallowing)    Other  Recommendations Recommended Consults:  (Dietician f/u; Palliative Care f/u) Oral Care Recommendations: Oral care BID;Oral care before and after PO;Staff/trained caregiver to provide oral care Other Recommendations: Order thickener from pharmacy;Prohibited food (jello, ice cream, thin soups);Remove water pitcher;Have oral suction available   Follow up Recommendations Skilled Nursing facility (TBD)      Frequency and Duration min 2x/week  1 week       Prognosis Prognosis for Safe Diet Advancement: Guarded (-Fair) Barriers to Reach Goals: Cognitive deficits;Time post onset;Severity of deficits Barriers/Prognosis Comment: advance dementia      Swallow Study   General Date of Onset: 09/28/20 HPI: Pt is a 87 y.o. Caucasian male with medical history significant for Advanced Dementia, hypertension, dyslipidemia, BPH, anxiety, GERD, HOH, and osteoarthritis, who presented to the emergency room from [facility], with acute onset of altered mental status with decreased activity as well as lethargy. Pt being treated for acute metabolic encephalopathy possibly 2/2 to UTI & AKI.  Head CT: no acute changes.  CXR: No active disease. Cardiomegaly. Type of Study: Bedside Swallow Evaluation Previous Swallow Assessment: 02/2019 - minced foods diet w/ thin liquids then; oral phase deficits w/ Cognitive decline impacting the oral phase and self-feeding Diet Prior to this Study: NPO Temperature Spikes  Noted: No (wbc 5.3) Respiratory Status: Room air History of Recent Intubation: No Behavior/Cognition: Alert;Cooperative;Confused;Pleasant mood;Distractible;Doesn't follow directions;Requires cueing Oral Cavity Assessment: Dry (debris) Oral Care Completed by SLP: Yes Oral Cavity - Dentition: Adequate natural dentition Vision:  (n/a) Self-Feeding Abilities: Total assist (did not attempt to self-feed) Patient Positioning: Upright in bed (needed positioning) Baseline Vocal Quality: Low vocal intensity (mumbled/muttered phonations) Volitional Cough: Cognitively unable to elicit Volitional Swallow: Unable to elicit    Oral/Motor/Sensory Function Overall Oral Motor/Sensory Function: Generalized oral weakness (did not appear unilateral) Facial Symmetry: Within Functional Limits Lingual Symmetry: Within Functional Limits   Ice Chips Ice chips: Within functional limits Presentation: Spoon (fed; 8 trials)   Thin Liquid Thin Liquid: Impaired Presentation: Cup;Spoon (fed; 3 trials) Oral Phase Impairments: Poor awareness of bolus Pharyngeal  Phase Impairments: Throat Clearing - Immediate;Throat Clearing - Delayed (3/3 trials)    Nectar Thick Nectar Thick Liquid: Within functional limits Presentation: Spoon (10 trials) Other Comments: still poor oral awareness overall   Honey Thick Honey Thick Liquid: Not tested   Puree Puree: Within functional limits Presentation: Spoon (fed; 10 trials) Other Comments: still poor oral awareness overall   Solid     Solid: Not tested        Orinda Kenner, MS, CCC-SLP Speech Language Pathologist Rehab Services [phone] [facility] 09/29/2020,4:04 PM

## 2020-09-29 NOTE — Evaluation (Signed)
Physical Therapy Evaluation Patient Details Name: Billy Bennett. MRN: [id number] DOB: 08/06/1923 Today's Date: 09/29/2020   History of Present Illness  Pt is a 87 y/o M admitted on 09/28/20 with c/c of AMS. Pt being treated for acute metabolic encephalopathy possibly 2/2 to UTI & AKI. PMH: dementia, HTN, dyslipidemia, BPH, anxiety, OA, GERD, systolic heart murmur, HOH, skin CA  Clinical Impression  Pt seen for PT evaluation with pt HOH & hx of dementia, unable to follow tactile/visual cuing throughout session. Pt requires total assist for supine<>sit but is able to sit EOB with supervision without LOB noted. Pt unable to follow cuing/instruction to perform LAQ seated EOB, nor was he able to initiate sit>stand. Pt would benefit from STR upon d/c to maximize independence with functional mobility, reduce fall risk, & decrease caregiver burden prior to return home.     Follow Up Recommendations SNF    Equipment Recommendations   (TBD in next venue)    Recommendations for Other Services       Precautions / Restrictions Precautions Precautions: Fall Restrictions Weight Bearing Restrictions: No      Mobility  Bed Mobility Overal bed mobility: Needs Assistance Bed Mobility: Supine to Sit;Sit to Supine     Supine to sit: Total assist;HOB elevated Sit to supine: Total assist;HOB elevated   General bed mobility comments: +2 assist to scoot to [facility]; pt mildly initiates moving BLE to EOB but ultimately requires total assist for supine<>sit    Transfers Overall transfer level:  (unable to initiate sit>stand even with max multimodal cuing, does not maintain safe foot placement as he will slide feet forward)                  Ambulation/Gait                Stairs            Wheelchair Mobility    Modified Rankin (Stroke Patients Only)       Balance Overall balance assessment: Needs assistance Sitting-balance support: Feet supported;Bilateral upper  extremity supported Sitting balance-Leahy Scale: Fair Sitting balance - Comments: able to sit EOB without LOB                                     Pertinent Vitals/Pain Pain Assessment: Faces Faces Pain Scale: No hurt    Home Living Family/patient expects to be discharged to:: Skilled nursing facility                 Additional Comments: pt unable to state    Prior Function           Comments: unsure, pt unable to state, will attempt to call SNF as time allows     Hand Dominance        Extremity/Trunk Assessment   Upper Extremity Assessment Upper Extremity Assessment: Difficult to assess due to impaired cognition    Lower Extremity Assessment Lower Extremity Assessment: Difficult to assess due to impaired cognition (BLE PROM WNL)    Cervical / Trunk Assessment Cervical / Trunk Assessment: Kyphotic  Communication   Communication: HOH;Expressive difficulties (only mumbles, no clear verbal communication)  Cognition Arousal/Alertness: Awake/alert Behavior During Therapy: Flat affect Overall Cognitive Status: History of cognitive impairments - at baseline (hx of dementia)  General Comments: Pt HOH with hx of dementia, unable to follow visual/tactile cuing to perform various exercises during session      General Comments      Exercises     Assessment/Plan    PT Assessment Patient needs continued PT services  PT Problem List Decreased strength;Decreased mobility;Decreased safety awareness;Decreased activity tolerance;Cardiopulmonary status limiting activity;Decreased cognition;Decreased knowledge of use of DME;Decreased balance       PT Treatment Interventions Therapeutic exercise;Wheelchair mobility training;DME instruction;Gait training;Balance training;Stair training;Neuromuscular re-education;Modalities;Manual techniques;Functional mobility training;Cognitive remediation;Therapeutic  activities;Patient/family education    PT Goals (Current goals can be found in the Care Plan section)  Acute Rehab PT Goals PT Goal Formulation: Patient unable to participate in goal setting Time For Goal Achievement: 10/13/20    Frequency Min 2X/week   Barriers to discharge        Co-evaluation               AM-PAC PT "6 Clicks" Mobility  Outcome Measure Help needed turning from your back to your side while in a flat bed without using bedrails?: Total Help needed moving from lying on your back to sitting on the side of a flat bed without using bedrails?: Total Help needed moving to and from a bed to a chair (including a wheelchair)?: Total Help needed standing up from a chair using your arms (e.g., wheelchair or bedside chair)?: Total Help needed to walk in hospital room?: Total Help needed climbing 3-5 steps with a railing? : Total 6 Click Score: 6    End of Session   Activity Tolerance: Patient tolerated treatment well Patient left: in bed;with call bell/phone within reach;with bed alarm set Nurse Communication: Mobility status PT Visit Diagnosis: Difficulty in walking, not elsewhere classified (R26.2);Muscle weakness (generalized) (M62.81);Other abnormalities of gait and mobility (R26.89)    Time: 1062-6948 PT Time Calculation (min) (ACUTE ONLY): 9 min   Charges:   PT Evaluation $PT Eval Low Complexity: Galliano, PT, DPT 09/29/20, 12:07 PM   Waunita Schooner 09/29/2020, 12:05 PM

## 2020-09-30 DIAGNOSIS — F0390 Unspecified dementia without behavioral disturbance: Secondary | ICD-10-CM

## 2020-09-30 LAB — CBC
HCT: 34 % — ABNORMAL LOW (ref 39.0–52.0)
Hemoglobin: 11.3 g/dL — ABNORMAL LOW (ref 13.0–17.0)
MCH: 30.2 pg (ref 26.0–34.0)
MCHC: 33.2 g/dL (ref 30.0–36.0)
MCV: 90.9 fL (ref 80.0–100.0)
Platelets: 157 10*3/uL (ref 150–400)
RBC: 3.74 MIL/uL — ABNORMAL LOW (ref 4.22–5.81)
RDW: 17.3 % — ABNORMAL HIGH (ref 11.5–15.5)
WBC: 7.6 10*3/uL (ref 4.0–10.5)
nRBC: 0 % (ref 0.0–0.2)

## 2020-09-30 LAB — URINE CULTURE
Culture: NO GROWTH
Special Requests: NORMAL

## 2020-09-30 LAB — BASIC METABOLIC PANEL
Anion gap: 4 — ABNORMAL LOW (ref 5–15)
BUN: 28 mg/dL — ABNORMAL HIGH (ref 8–23)
CO2: 26 mmol/L (ref 22–32)
Calcium: 8.6 mg/dL — ABNORMAL LOW (ref 8.9–10.3)
Chloride: 112 mmol/L — ABNORMAL HIGH (ref 98–111)
Creatinine, Ser: 1.4 mg/dL — ABNORMAL HIGH (ref 0.61–1.24)
GFR, Estimated: 47 mL/min — ABNORMAL LOW (ref >60)
Glucose, Bld: 98 mg/dL (ref 70–99)
Potassium: 3.5 mmol/L (ref 3.5–5.1)
Sodium: 142 mmol/L (ref 135–145)

## 2020-09-30 NOTE — Evaluation (Signed)
Occupational Therapy Evaluation Patient Details Name: Billy Bennett. MRN: [id number] DOB: Sep 11, 1923 Today's Date: 09/30/2020    History of Present Illness Pt is a 87 y/o M admitted on 09/28/20 with c/c of AMS. Pt being treated for acute metabolic encephalopathy possibly 2/2 to UTI & AKI. PMH: dementia, HTN, dyslipidemia, BPH, anxiety, OA, GERD, systolic heart murmur, HOH, skin CA   Clinical Impression   Pt. Presents with weakness,  impaired cognition, and limited functional mobility which hinders his ability to complete ADL, IADL tasks. Pt. Was unable to provide detailed environmental, and functional history information. Pt. Required Total A for hand to face patterns with hand-over-hand assist for self-grooming tasks. Pt. presents with positive leaning to the right, Pt. was assisted with repositioning to midline requiring TotalA. Pt. could benefit from OT services for ADL training, A/E training, and pt./caregiver education about positioning, and DME. Pt. would benefit from SNF level of care upon discharge, with follow-up OT services.     Follow Up Recommendations  SNF    Equipment Recommendations     TBD  Recommendations for Other Services       Precautions / Restrictions Precautions Precautions: Fall      Mobility Bed Mobility Overal bed mobility: Needs Assistance Bed Mobility: Supine to Sit;Sit to Supine     Supine to sit: Total assist;HOB elevated          Transfers                 General transfer comment: Pt. unable    Balance                                           ADL either performed or assessed with clinical judgement   ADL Overall ADL's : Needs assistance/impaired Eating/Feeding: Total assistance   Grooming: Total assistance   Upper Body Bathing: Total assistance   Lower Body Bathing: Total assistance   Upper Body Dressing : Total assistance   Lower Body Dressing: Total assistance                        Vision         Perception     Praxis      Pertinent Vitals/Pain Pain Assessment: Faces Faces Pain Scale: No hurt     Hand Dominance     Extremity/Trunk Assessment Upper Extremity Assessment Upper Extremity Assessment: Generalized weakness (Limited right shoulder End ROM)           Communication Communication Communication: HOH;Expressive difficulties   Cognition Arousal/Alertness: Awake/alert Behavior During Therapy: Flat affect Overall Cognitive Status: History of cognitive impairments - at baseline                                     General Comments       Exercises     Shoulder Instructions      Home Living Family/patient expects to be discharged to:: Skilled nursing facility                                 Additional Comments: pt unable to state      Prior Functioning/Environment Level of Independence:  (Pt. unable to provide detailed history)  OT Problem List: Decreased strength;Decreased cognition;Decreased range of motion;Impaired UE functional use;Decreased coordination;Decreased activity tolerance      OT Treatment/Interventions: Self-care/ADL training;Patient/family education;Therapeutic exercise    OT Goals(Current goals can be found in the care plan section) Acute Rehab OT Goals OT Goal Formulation: Patient unable to participate in goal setting  OT Frequency: Min 1X/week   Barriers to D/C:            Co-evaluation              AM-PAC OT "6 Clicks" Daily Activity     Outcome Measure Help from another person eating meals?: Total Help from another person taking care of personal grooming?: Total Help from another person toileting, which includes using toliet, bedpan, or urinal?: Total Help from another person bathing (including washing, rinsing, drying)?: Total Help from another person to put on and taking off regular upper body clothing?: Total Help from another person to  put on and taking off regular lower body clothing?: Total 6 Click Score: 6   End of Session Equipment Utilized During Treatment: Gait belt  Activity Tolerance: Patient tolerated treatment well Patient left: in bed;with call bell/phone within reach;with bed alarm set  OT Visit Diagnosis: Muscle weakness (generalized) (M62.81);Cognitive communication deficit (R41.841)                Time: 0945-1000 OT Time Calculation (min): 15 min Charges:  OT General Charges $OT Visit: 1 Visit OT Evaluation $OT Eval Low Complexity: 1 Low  Harrel Carina, MS, OTR/L   Harrel Carina 09/30/2020, 12:14 PM

## 2020-09-30 NOTE — Progress Notes (Addendum)
Spoke to [place], NP about po medications this evening - hydralazine and pravastatin. Last BP 133/91, verbal order to hold po medications tonight.

## 2020-09-30 NOTE — Plan of Care (Signed)
  Problem: Health Behavior/Discharge Planning: Goal: Ability to manage health-related needs will improve Outcome: Progressing   Problem: Education: Goal: Knowledge of General Education information will improve Description: Including pain rating scale, medication(s)/side effects and non-pharmacologic comfort measures Outcome: Progressing   Problem: Clinical Measurements: Goal: Ability to maintain clinical measurements within normal limits will improve Outcome: Progressing Goal: Will remain free from infection Outcome: Progressing Goal: Diagnostic test results will improve Outcome: Progressing Goal: Respiratory complications will improve Outcome: Progressing Goal: Cardiovascular complication will be avoided Outcome: Progressing   Problem: Activity: Goal: Risk for activity intolerance will decrease Outcome: Progressing   Problem: Nutrition: Goal: Adequate nutrition will be maintained Outcome: Progressing   Problem: Elimination: Goal: Will not experience complications related to bowel motility Outcome: Progressing Goal: Will not experience complications related to urinary retention Outcome: Progressing   Problem: Coping: Goal: Level of anxiety will decrease Outcome: Progressing   Problem: Pain Managment: Goal: General experience of comfort will improve Outcome: Progressing   Problem: Safety: Goal: Ability to remain free from injury will improve Outcome: Progressing   Problem: Skin Integrity: Goal: Risk for impaired skin integrity will decrease Outcome: Progressing

## 2020-09-30 NOTE — Progress Notes (Signed)
Speech Language Pathology Treatment: Dysphagia  Patient Details Name: Billy Bennett. MRN: [id number] DOB: 1923/11/11 Today's Date: 09/30/2020 Time: 5462-7035 SLP Time Calculation (min) (ACUTE ONLY): 35 min  Assessment / Plan / Recommendation Clinical Impression  Per chart notes/NSG, pt did not appear to be managing his Dysphagia diet yesterday in conjunction w/ the Baseline Congested upper airway presentation. ST services followed up this morning for ongoing assessment. Pt appears to present w/ similar oropharyngeal phase dysphagia as at initial evaluation in light of Significantly declined Cognitive status; advanced Dementia at his Baseline per chart. Pt is also exhibiting Mod-Severe increased upper airway congestion w/ mucousy, wet vocal quality at Rest -- PRIOR TO any po's w/ minimal attempts to cough/clear this. Decreased awareness and attention to self w/ advanced Cognitive decline impacts his overall awareness/engagement and safety during po tasks which increases risk for aspiration, choking. Pt's risk for aspiration, as well as meeting nutritional needs fully, is present at this time. It was difficult to discern pt's vocal quality and effectiveness of the pharygneal swallow w/ the amount of laryngeal-pharyngeal Phlegm Baseline. He required Mod tactile/verbal/ visual cues for orientation to bolus presentation, and during feeding support of the trials.   Pt consumed few trials of puree and TSP sips of Nectar liquids(3 each). Overt coughing (delayed/immediate) noted w/  consistencies as at his Baseline d/t the Congestion. Suspect delayed pharyngeal swallow initiation, and impact from the Phlegm. Also noted reduced laryngeal excursion and multiple swallows during each trial. Oral phase was c/b min prolonged bolus management w/ each trial consistency. Oral clearing given Time. Speech muttered/mubled but clear at times for 1-2 words -- Phlegmy quality to phonations/verbalizations.     D/t pt's  declined Cognitive status, and his risk for aspiration, recommend strict NPO status w/ frequent oral care for hygiene and stimulation of swallowing. ST Services will f/u on Monday for continued assessment of swallow function and safety for initiation of oral diet; objective assessment if indicated. Recommend meds via alternative means at this time.     ST services recommends follow w/ Palliative Care for [place] at discharge to discuss the impact of Dementia and swallowing w/ family, pt. Largely suspect that pt's advanced Dementia could hamper safety w/ oral intake, meeting hydration needs, and upgrade of diet.      HPI HPI: Pt is a 87 y.o. Caucasian male with medical history significant for Advanced Dementia, hypertension, dyslipidemia, BPH, anxiety, GERD, HOH, and osteoarthritis, who presented to the emergency room from [facility], with acute onset of altered mental status with decreased activity as well as lethargy. Pt being treated for acute metabolic encephalopathy possibly 2/2 to UTI & AKI.  Head CT: no acute changes.  CXR: No active disease. Cardiomegaly.      SLP Plan  Continue with current plan of care       Recommendations  Diet recommendations: NPO (f/u w/ ongoing assessment) Medication Administration: Via alternative means                General recommendations:  (Palliative Care for Ed/GOC; dietician f/u) Oral Care Recommendations: Oral care QID;Staff/trained caregiver to provide oral care Follow up Recommendations: Skilled Nursing facility (TBD) SLP Visit Diagnosis: Dysphagia, oropharyngeal phase (R13.12) (Dementia - Cognitive decline) Plan: Continue with current plan of care       GO                 Orinda Kenner, [place], CCC-SLP Speech Language Pathologist Rehab Services [phone] [facility] 09/30/2020, 12:12 PM

## 2020-09-30 NOTE — Progress Notes (Signed)
PROGRESS NOTE    Billy Bennett.  FGH:[id number] DOB: Jan 13, 1924 DOA: 09/28/2020 PCP: Gayland Curry, MD    Assessment & Plan:   Active Problems:   AMS (altered mental status)   Pressure injury of skin  Acute metabolic encephalopathy: etiology unclear, possibly secondary to UTI vs worsening dementia. Na level is back WNL. Urine cx is pending still. Continue on IV ceftriaxone  Possible UTI: urine cx is pending. Continue on IV rocephin  Likely CKD: baseline Cr/GFR is unknown, currently IIIa. Cr is trending up from day prior  Hypernatremia: resolved  Likely dysphagia: likely secondary to dementia. NPO as per speech   Dementia: advanced. Continue w/ supportive care   HTN: continue on home dose of amlodipine, hydralazine. Continue to hold home dose of HCTZ  BPH: continue on home dose of proscar, flomax  GERD: continue on pantoprazole    HLD: continue on statin   Peripheral neuropathy: will hold home dose of gabapentin secondary AMS   DVT prophylaxis: lovenox  Code Status: full  Family Communication: discussed pt's care w/ pt's nephew, Richard, and answered his questions Disposition Plan: likely back to home facilit  Level of care: Med-Surg   Status is: Inpatient  Remains inpatient appropriate because:Unsafe d/c plan, IV treatments appropriate due to intensity of illness or inability to take PO, and Inpatient level of care appropriate due to severity of illness  Dispo: The patient is from: SNF              Anticipated d/c is to: SNF              Patient currently is not medically stable to d/c.   Difficult to place patient : unclear    Consultants:    Procedures:   Antimicrobials: rocephin   Subjective: Pt is only oriented to self.  Objective: Vitals:   09/29/20 1515 09/29/20 2057 09/29/20 2333 09/30/20 0452  BP: 129/66 135/69 (!) 141/68 135/75  Pulse: 88 87 89 95  Resp: 18 16 16 20   Temp: 98.3 F (36.8 C) 97.7 F (36.5 C) (!) 97.4 F (36.3  C) 98.3 F (36.8 C)  TempSrc: Oral Oral Oral Oral  SpO2: 98% 93% 95% 96%  Weight:      Height:        Intake/Output Summary (Last 24 hours) at 09/30/2020 0734 Last data filed at 09/30/2020 0200 Gross per 24 hour  Intake 1382.5 ml  Output --  Net 1382.5 ml   Filed Weights   09/28/20 1730 09/28/20 2331  Weight: 61.2 kg 59.8 kg    Examination:  General exam: Appears comfortable Respiratory system: clear breath sounds b/l  Cardiovascular system: S1/S2+. No rubs or clicks Gastrointestinal system: Abd is soft, NT, ND & hypoactive bowel sounds  Central nervous system: Alert and oriented. Moves all extremities  Psychiatry: Judgement and insight appear abnormal. Flat mood and affect     Data Reviewed: I have personally reviewed following labs and imaging studies  CBC: Recent Labs  Lab 09/28/20 1700 09/29/20 0414 09/30/20 0527  WBC 7.0 5.3 7.6  NEUTROABS 5.5  --   --   HGB 12.3* 11.5* 11.3*  HCT 38.6* 35.4* 34.0*  MCV 94.1 93.2 90.9  PLT 175 171 678   Basic Metabolic Panel: Recent Labs  Lab 09/28/20 1700 09/29/20 0414 09/30/20 0527  NA 146* 151* 142  K 3.7 4.0 3.5  CL 113* 119* 112*  CO2 23 22 26   GLUCOSE 101* 61* 98  BUN 46* 43* 28*  CREATININE  1.50* 1.33* 1.40*  CALCIUM 9.0 8.7* 8.6*   GFR: Estimated Creatinine Clearance: 27.3 mL/min (A) (by C-G formula based on SCr of 1.4 mg/dL (H)). Liver Function Tests: No results for input(s): AST, ALT, ALKPHOS, BILITOT, PROT, ALBUMIN in the last 168 hours. No results for input(s): LIPASE, AMYLASE in the last 168 hours. No results for input(s): AMMONIA in the last 168 hours. Coagulation Profile: No results for input(s): INR, PROTIME in the last 168 hours. Cardiac Enzymes: No results for input(s): CKTOTAL, CKMB, CKMBINDEX, TROPONINI in the last 168 hours. BNP (last 3 results) No results for input(s): PROBNP in the last 8760 hours. HbA1C: No results for input(s): HGBA1C in the last 72 hours. CBG: No results for  input(s): GLUCAP in the last 168 hours. Lipid Profile: No results for input(s): CHOL, HDL, LDLCALC, TRIG, CHOLHDL, LDLDIRECT in the last 72 hours. Thyroid Function Tests: No results for input(s): TSH, T4TOTAL, FREET4, T3FREE, THYROIDAB in the last 72 hours. Anemia Panel: No results for input(s): VITAMINB12, FOLATE, FERRITIN, TIBC, IRON, RETICCTPCT in the last 72 hours. Sepsis Labs: Recent Labs  Lab 09/28/20 1703  LATICACIDVEN 0.8    Recent Results (from the past 240 hour(s))  Resp Panel by RT-PCR (Flu A&B, Covid) Nasopharyngeal Swab     Status: None   Collection Time: 09/28/20  6:58 PM   Specimen: Nasopharyngeal Swab; Nasopharyngeal(NP) swabs in vial transport medium  Result Value Ref Range Status   SARS Coronavirus 2 by RT PCR NEGATIVE NEGATIVE Final    Comment: (NOTE) SARS-CoV-2 target nucleic acids are NOT DETECTED.  The SARS-CoV-2 RNA is generally detectable in upper respiratory specimens during the acute phase of infection. The lowest concentration of SARS-CoV-2 viral copies this assay can detect is 138 copies/mL. A negative result does not preclude SARS-Cov-2 infection and should not be used as the sole basis for treatment or other patient management decisions. A negative result may occur with  improper specimen collection/handling, submission of specimen other than nasopharyngeal swab, presence of viral mutation(s) within the areas targeted by this assay, and inadequate number of viral copies(<138 copies/mL). A negative result must be combined with clinical observations, patient history, and epidemiological information. The expected result is Negative.  Fact Sheet for Patients:  EntrepreneurPulse.com.au  Fact Sheet for Healthcare Providers:  IncredibleEmployment.be  This test is no t yet approved or cleared by the [place] FDA and  has been authorized for detection and/or diagnosis of SARS-CoV-2 by FDA under an Emergency Use  Authorization (EUA). This EUA will remain  in effect (meaning this test can be used) for the duration of the COVID-19 declaration under Section 564(b)(1) of the Act, 21 U.S.C.section 360bbb-3(b)(1), unless the authorization is terminated  or revoked sooner.       Influenza A by PCR NEGATIVE NEGATIVE Final   Influenza B by PCR NEGATIVE NEGATIVE Final    Comment: (NOTE) The Xpert Xpress SARS-CoV-2/FLU/RSV plus assay is intended as an aid in the diagnosis of influenza from Nasopharyngeal swab specimens and should not be used as a sole basis for treatment. Nasal washings and aspirates are unacceptable for Xpert Xpress SARS-CoV-2/FLU/RSV testing.  Fact Sheet for Patients: EntrepreneurPulse.com.au  Fact Sheet for Healthcare Providers: IncredibleEmployment.be  This test is not yet approved or cleared by the [place] FDA and has been authorized for detection and/or diagnosis of SARS-CoV-2 by FDA under an Emergency Use Authorization (EUA). This EUA will remain in effect (meaning this test can be used) for the duration of the COVID-19 declaration under Section 564(b)(1)  of the Act, 21 U.S.C. section 360bbb-3(b)(1), unless the authorization is terminated or revoked.  Performed at [facility], [address] [place], [place] [address]          Radiology Studies: DG Chest 1 View  Result Date: 09/28/2020 CLINICAL DATA:  Altered mental status EXAM: CHEST  1 VIEW COMPARISON:  07/22/2020 FINDINGS: Low lung volumes. Enlarged cardiomediastinal silhouette with aortic atherosclerosis. No focal airspace disease, pleural effusion, or pneumothorax. IMPRESSION: No active disease.  Cardiomegaly Electronically Signed   By: Donavan Foil M.D.   On: 09/28/2020 17:45   CT Head Wo Contrast  Result Date: 09/28/2020 CLINICAL DATA:  Altered mental status.  No reported injury. EXAM: CT HEAD WITHOUT CONTRAST TECHNIQUE: Contiguous axial images were  obtained from the base of the skull through the vertex without intravenous contrast. COMPARISON:  07/22/2020 head CT. FINDINGS: Brain: No evidence of parenchymal hemorrhage or extra-axial fluid collection. No mass lesion, mass effect, or midline shift. No CT evidence of acute infarction. Generalized cerebral volume loss. Nonspecific mild subcortical and periventricular white matter hypodensity, most in keeping with chronic small vessel ischemic change. Cerebral ventricle sizes are stable and concordant with the degree of cerebral volume loss. Vascular: No acute abnormality. Skull: No evidence of calvarial fracture. Sinuses/Orbits: No fluid levels. Chronic mucoperiosteal thickening in the right ethmoidal air cells. Other:  The mastoid air cells are unopacified. IMPRESSION: 1. No evidence of acute intracranial abnormality. 2. Generalized cerebral volume loss and mild chronic small vessel ischemic changes in the cerebral white matter. 3. Chronic right ethmoidal sinusitis. Electronically Signed   By: Ilona Sorrel M.D.   On: 09/28/2020 17:56   DG Chest Port 1 View  Result Date: 09/29/2020 CLINICAL DATA:  Possible aspiration EXAM: PORTABLE CHEST 1 VIEW COMPARISON:  09/28/2020 FINDINGS: Cardiac shadow is enlarged but stable. Aortic calcifications are again seen. Lungs are well aerated bilaterally. No focal infiltrate or sizable effusion is noted. No bony abnormality is seen. IMPRESSION: No acute abnormality noted. Electronically Signed   By: Inez Catalina M.D.   On: 09/29/2020 19:02        Scheduled Meds:  amLODipine  10 mg Oral Daily   enoxaparin (LOVENOX) injection  30 mg Subcutaneous Q24H   ferrous sulfate  325 mg Oral Q breakfast   finasteride  5 mg Oral Daily   hydrALAZINE  25 mg Oral TID   pantoprazole  40 mg Oral Daily   pravastatin  10 mg Oral QHS   tamsulosin  0.4 mg Oral Daily   Continuous Infusions:  cefTRIAXone (ROCEPHIN)  IV Stopped (09/29/20 2116)   dextrose 75 mL/hr at 09/29/20 0854      LOS: 2 days    Time spent: 30 mins    Wyvonnia Dusky, MD [facility] Pager [phone]  If 7PM-7AM, please contact night-coverage 09/30/2020, 7:34 AM

## 2020-10-01 DIAGNOSIS — N1831 Chronic kidney disease, stage 3a: Secondary | ICD-10-CM

## 2020-10-01 DIAGNOSIS — D696 Thrombocytopenia, unspecified: Secondary | ICD-10-CM

## 2020-10-01 LAB — BASIC METABOLIC PANEL
Anion gap: 7 (ref 5–15)
BUN: 22 mg/dL (ref 8–23)
CO2: 26 mmol/L (ref 22–32)
Calcium: 9 mg/dL (ref 8.9–10.3)
Chloride: 111 mmol/L (ref 98–111)
Creatinine, Ser: 1.1 mg/dL (ref 0.61–1.24)
GFR, Estimated: >60 mL/min (ref >60)
Glucose, Bld: 87 mg/dL (ref 70–99)
Potassium: 3.3 mmol/L — ABNORMAL LOW (ref 3.5–5.1)
Sodium: 144 mmol/L (ref 135–145)

## 2020-10-01 LAB — CBC
HCT: 33.7 % — ABNORMAL LOW (ref 39.0–52.0)
Hemoglobin: 11.2 g/dL — ABNORMAL LOW (ref 13.0–17.0)
MCH: 30.3 pg (ref 26.0–34.0)
MCHC: 33.2 g/dL (ref 30.0–36.0)
MCV: 91.1 fL (ref 80.0–100.0)
Platelets: 148 10*3/uL — ABNORMAL LOW (ref 150–400)
RBC: 3.7 MIL/uL — ABNORMAL LOW (ref 4.22–5.81)
RDW: 16.9 % — ABNORMAL HIGH (ref 11.5–15.5)
WBC: 8.1 10*3/uL (ref 4.0–10.5)
nRBC: 0 % (ref 0.0–0.2)

## 2020-10-01 MED ORDER — POTASSIUM CHLORIDE CRYS ER 20 MEQ PO TBCR
40.0000 meq | EXTENDED_RELEASE_TABLET | Freq: Once | ORAL | Status: AC
  Administered 2020-10-01: 40 meq via ORAL
  Filled 2020-10-01: qty 2

## 2020-10-01 MED ORDER — DEXTROSE-NACL 5-0.9 % IV SOLN: INTRAVENOUS | Status: DC

## 2020-10-01 MED ORDER — ENOXAPARIN SODIUM 40 MG/0.4ML IJ SOSY
40.0000 mg | PREFILLED_SYRINGE | INTRAMUSCULAR | Status: DC
  Administered 2020-10-01 – 2020-10-02 (×2): 40 mg via SUBCUTANEOUS
  Filled 2020-10-01 (×2): qty 0.4

## 2020-10-01 NOTE — Progress Notes (Signed)
PHARMACIST - PHYSICIAN COMMUNICATION  CONCERNING:  Enoxaparin (Lovenox) for DVT Prophylaxis    RECOMMENDATION: Patient was prescribed enoxaprin 40mg  q24 hours for VTE prophylaxis.   Filed Weights   09/28/20 1730 09/28/20 2331  Weight: 61.2 kg (135 lb) 59.8 kg (131 lb 13.4 oz)    Body mass index is 21.94 kg/m.  Estimated Creatinine Clearance: 34.7 mL/min (by C-G formula based on SCr of 1.1 mg/dL).  Patient is candidate for enoxaparin 40mg  every 24 hours based on CrCl > 42ml/min   DESCRIPTION: Pharmacy has adjusted enoxaparin dose per [facility] policy.  Patient is now receiving enoxaparin 40 mg every 24 hours    Dallie Piles, PharmD Clinical Pharmacist  10/01/2020 9:09 AM

## 2020-10-01 NOTE — Progress Notes (Signed)
PROGRESS NOTE    Billy Bennett.  IOE:[id number] DOB: 11-02-1923 DOA: 09/28/2020 PCP: Gayland Curry, MD    Assessment & Plan:   Active Problems:   AMS (altered mental status)   Pressure injury of skin  Acute metabolic encephalopathy: etiology unclear, possibly secondary to UTI vs worsening dementia. Na level is back WNL. Urine cx shows no growth.  Possible UTI: continue on IV rocephin and will complete course today   Likely CKD: baseline Cr/GFR is unknown, currently IIIa. Cr is trending down from day prior   Thrombocytopenia: etiology unclear. Will continue to monitor   Hypernatremia: resolved  Likely dysphagia: likely secondary to dementia. NPO as per speech   Dementia: advanced. Continue w/ supportive care   HTN: continue on home dose of amlodipine, hydralazine. Continue to hold home dose of HCTZ  BPH: continue on home dose of proscar, flomax  GERD: continue on PPI   HLD: continue on statin   Peripheral neuropathy: will continue on home dose of gabapentin   DVT prophylaxis: lovenox  Code Status: full  Family Communication:  Disposition Plan: likely back to home facility  Level of care: Med-Surg   Status is: Inpatient  Remains inpatient appropriate because:Unsafe d/c plan, IV treatments appropriate due to intensity of illness or inability to take PO, and Inpatient level of care appropriate due to severity of illness  Dispo: The patient is from: SNF              Anticipated d/c is to: SNF              Patient currently is not medically stable to d/c.   Difficult to place patient : unclear    Consultants:    Procedures:   Antimicrobials: rocephin   Subjective: Pt is oriented to self only   Objective: Vitals:   09/30/20 1257 09/30/20 2020 10/01/20 0100 10/01/20 0519  BP: (!) 138/111 (!) 133/91 (!) 142/96 (!) 133/96  Pulse: 87 61 97 93  Resp: 16 16 16 16   Temp: (!) 97.5 F (36.4 C) 97.7 F (36.5 C) 98.5 F (36.9 C) 98.6 F (37 C)   TempSrc:   Oral   SpO2: 95% 92% 92% 94%  Weight:      Height:        Intake/Output Summary (Last 24 hours) at 10/01/2020 0740 Last data filed at 10/01/2020 0646 Gross per 24 hour  Intake 100 ml  Output 2025 ml  Net -1925 ml   Filed Weights   09/28/20 1730 09/28/20 2331  Weight: 61.2 kg 59.8 kg    Examination:  General exam: Appears comfortable  Respiratory system: clear breath sounds b/l  Cardiovascular system: S1 & S2+. No rubs or clicks  Gastrointestinal system: Abd is soft, NT, ND & hypoactive bowel sounds Central nervous system: Alert and oriented to self only. Moves all extremities  Psychiatry: judgement and insight appears abnormal. Flat mood and affect     Data Reviewed: I have personally reviewed following labs and imaging studies  CBC: Recent Labs  Lab 09/28/20 1700 09/29/20 0414 09/30/20 0527 10/01/20 0534  WBC 7.0 5.3 7.6 8.1  NEUTROABS 5.5  --   --   --   HGB 12.3* 11.5* 11.3* 11.2*  HCT 38.6* 35.4* 34.0* 33.7*  MCV 94.1 93.2 90.9 91.1  PLT 175 171 157 182*   Basic Metabolic Panel: Recent Labs  Lab 09/28/20 1700 09/29/20 0414 09/30/20 0527 10/01/20 0534  NA 146* 151* 142 144  K 3.7 4.0 3.5 3.3*  CL 113* 119* 112* 111  CO2 23 22 26 26   GLUCOSE 101* 61* 98 87  BUN 46* 43* 28* 22  CREATININE 1.50* 1.33* 1.40* 1.10  CALCIUM 9.0 8.7* 8.6* 9.0   GFR: Estimated Creatinine Clearance: 34.7 mL/min (by C-G formula based on SCr of 1.1 mg/dL). Liver Function Tests: No results for input(s): AST, ALT, ALKPHOS, BILITOT, PROT, ALBUMIN in the last 168 hours. No results for input(s): LIPASE, AMYLASE in the last 168 hours. No results for input(s): AMMONIA in the last 168 hours. Coagulation Profile: No results for input(s): INR, PROTIME in the last 168 hours. Cardiac Enzymes: No results for input(s): CKTOTAL, CKMB, CKMBINDEX, TROPONINI in the last 168 hours. BNP (last 3 results) No results for input(s): PROBNP in the last 8760 hours. HbA1C: No results  for input(s): HGBA1C in the last 72 hours. CBG: No results for input(s): GLUCAP in the last 168 hours. Lipid Profile: No results for input(s): CHOL, HDL, LDLCALC, TRIG, CHOLHDL, LDLDIRECT in the last 72 hours. Thyroid Function Tests: No results for input(s): TSH, T4TOTAL, FREET4, T3FREE, THYROIDAB in the last 72 hours. Anemia Panel: No results for input(s): VITAMINB12, FOLATE, FERRITIN, TIBC, IRON, RETICCTPCT in the last 72 hours. Sepsis Labs: Recent Labs  Lab 09/28/20 1703  LATICACIDVEN 0.8    Recent Results (from the past 240 hour(s))  Urine culture     Status: None   Collection Time: 09/28/20  5:00 PM   Specimen: Urine, Random  Result Value Ref Range Status   Specimen Description   Final    URINE, RANDOM Performed at [facility], [address], [place], [place] [address]    Special Requests   Final    Normal Performed at [facility], [address] [place], [place] [address]    Culture   Final    NO GROWTH Performed at [facility], [place] [address] [place], [place] [address]    Report Status 09/30/2020 FINAL  Final  Resp Panel by RT-PCR (Flu A&B, Covid) Nasopharyngeal Swab     Status: None   Collection Time: 09/28/20  6:58 PM   Specimen: Nasopharyngeal Swab; Nasopharyngeal(NP) swabs in vial transport medium  Result Value Ref Range Status   SARS Coronavirus 2 by RT PCR NEGATIVE NEGATIVE Final    Comment: (NOTE) SARS-CoV-2 target nucleic acids are NOT DETECTED.  The SARS-CoV-2 RNA is generally detectable in upper respiratory specimens during the acute phase of infection. The lowest concentration of SARS-CoV-2 viral copies this assay can detect is 138 copies/mL. A negative result does not preclude SARS-Cov-2 infection and should not be used as the sole basis for treatment or other patient management decisions. A negative result may occur with  improper specimen collection/handling, submission of specimen other than nasopharyngeal swab,  presence of viral mutation(s) within the areas targeted by this assay, and inadequate number of viral copies(<138 copies/mL). A negative result must be combined with clinical observations, patient history, and epidemiological information. The expected result is Negative.  Fact Sheet for Patients:  EntrepreneurPulse.com.au  Fact Sheet for Healthcare Providers:  IncredibleEmployment.be  This test is no t yet approved or cleared by the [place] FDA and  has been authorized for detection and/or diagnosis of SARS-CoV-2 by FDA under an Emergency Use Authorization (EUA). This EUA will remain  in effect (meaning this test can be used) for the duration of the COVID-19 declaration under Section 564(b)(1) of the Act, 21 U.S.C.section 360bbb-3(b)(1), unless the authorization is terminated  or revoked sooner.  Influenza A by PCR NEGATIVE NEGATIVE Final   Influenza B by PCR NEGATIVE NEGATIVE Final    Comment: (NOTE) The Xpert Xpress SARS-CoV-2/FLU/RSV plus assay is intended as an aid in the diagnosis of influenza from Nasopharyngeal swab specimens and should not be used as a sole basis for treatment. Nasal washings and aspirates are unacceptable for Xpert Xpress SARS-CoV-2/FLU/RSV testing.  Fact Sheet for Patients: EntrepreneurPulse.com.au  Fact Sheet for Healthcare Providers: IncredibleEmployment.be  This test is not yet approved or cleared by the [place] FDA and has been authorized for detection and/or diagnosis of SARS-CoV-2 by FDA under an Emergency Use Authorization (EUA). This EUA will remain in effect (meaning this test can be used) for the duration of the COVID-19 declaration under Section 564(b)(1) of the Act, 21 U.S.C. section 360bbb-3(b)(1), unless the authorization is terminated or revoked.  Performed at [facility], [address] [place], [place] [address]           Radiology Studies: [facility] Chest [place] 1 View  Result Date: 09/29/2020 CLINICAL DATA:  Possible aspiration EXAM: PORTABLE CHEST 1 VIEW COMPARISON:  09/28/2020 FINDINGS: Cardiac shadow is enlarged but stable. Aortic calcifications are again seen. Lungs are well aerated bilaterally. No focal infiltrate or sizable effusion is noted. No bony abnormality is seen. IMPRESSION: No acute abnormality noted. Electronically Signed   By: Inez Catalina M.D.   On: 09/29/2020 19:02        Scheduled Meds:  amLODipine  10 mg Oral Daily   enoxaparin (LOVENOX) injection  30 mg Subcutaneous Q24H   ferrous sulfate  325 mg Oral Q breakfast   finasteride  5 mg Oral Daily   hydrALAZINE  25 mg Oral TID   pantoprazole  40 mg Oral Daily   potassium chloride  40 mEq Oral Once   pravastatin  10 mg Oral QHS   tamsulosin  0.4 mg Oral Daily   Continuous Infusions:  cefTRIAXone (ROCEPHIN)  IV Stopped (09/30/20 2200)   dextrose 5 % and 0.9% NaCl       LOS: 3 days    Time spent: 25 mins    Wyvonnia Dusky, MD [facility] Pager [phone]  If 7PM-7AM, please contact night-coverage 10/01/2020, 7:40 AM

## 2020-10-01 NOTE — Plan of Care (Signed)
End of Shift Summary:  Pt more verbal this shift responding to questions, speech more clear and understandable. Oral care done every 2 hours with patient assistance. Large BM this AM. Urine output adequate. Remained free from falls or injury. Bed low and in locked position.

## 2020-10-02 LAB — BASIC METABOLIC PANEL
Anion gap: 9 (ref 5–15)
BUN: 14 mg/dL (ref 8–23)
CO2: 25 mmol/L (ref 22–32)
Calcium: 8.9 mg/dL (ref 8.9–10.3)
Chloride: 107 mmol/L (ref 98–111)
Creatinine, Ser: 0.87 mg/dL (ref 0.61–1.24)
GFR, Estimated: >60 mL/min (ref >60)
Glucose, Bld: 108 mg/dL — ABNORMAL HIGH (ref 70–99)
Potassium: 3.6 mmol/L (ref 3.5–5.1)
Sodium: 141 mmol/L (ref 135–145)

## 2020-10-02 LAB — CBC
HCT: 36.7 % — ABNORMAL LOW (ref 39.0–52.0)
Hemoglobin: 12.3 g/dL — ABNORMAL LOW (ref 13.0–17.0)
MCH: 30.2 pg (ref 26.0–34.0)
MCHC: 33.5 g/dL (ref 30.0–36.0)
MCV: 90.2 fL (ref 80.0–100.0)
Platelets: 166 10*3/uL (ref 150–400)
RBC: 4.07 MIL/uL — ABNORMAL LOW (ref 4.22–5.81)
RDW: 16.3 % — ABNORMAL HIGH (ref 11.5–15.5)
WBC: 9.3 10*3/uL (ref 4.0–10.5)
nRBC: 0 % (ref 0.0–0.2)

## 2020-10-02 NOTE — Progress Notes (Signed)
Speech Language Pathology Treatment: Dysphagia  Patient Details Name: Billy Bennett. MRN: [id number] DOB: 1924-03-25 Today's Date: 10/02/2020 Time: 8502-7741 SLP Time Calculation (min) (ACUTE ONLY): 45 min  Assessment / Plan / Recommendation Clinical Impression  Pt seen for ongoing assessment of toleration of po's; safety for oral diet. Pt continues to appear to present w/ oropharyngeal phase dysphagia in light of Significantly declined Cognitive status; Advanced Dementia at his Baseline per chart. Per chart notes, pt has been seen by this service previously and a Minced foods diet was rec'd then in 2020. During this admission, pt has had increased wet/phlegmy vocal quality and respirations impacting his upper airway. He appears to have less upper airway mucous/phlegm today than at previous session. Advanced Cognitive decline and Respiratory issues impact his overall awareness/engagement and his safety during po tasks which increases risk for aspiration, choking. Pt's risk for aspiration, as well as meeting nutritional needs fully, is present but can be reduced when following general aspiration precautions, given feeding assistance, and using a modified diet consistency. He required Mod tactile/verbal/ visual cues for orientation to bolus presentation, and during feeding support. Pt allowed SLP to do oral care today.    Pt consumed trials of ice chips, purees, and TSP sips of Nectar liquids. Overt coughing (delayed/immediate) noted w/ trials of ice chips -- suspect delayed pharyngeal swallow initiation; impact from Cognitive decline. No overt clinical s/s of aspiration noted w/ trials of Nectar liquids and purees -- no overt, sustained decline in vocal quality during few phonations, no immediate cough, and no decline in respiratory status during/post trials overall. Oral phase was grossly adequate for bolus management and oral clearing of the boluses given though overall decreased awareness in  bolus prep noted; min prolonged time orally.      D/t pt's declined Cognitive status, and his risk for aspiration, recommend re-initiation of the dysphagia level 1(puree) w/ Nectar liquids by TSP; aspiration precautions; reduce Distractions during meals and only give po's when pt is alert. Pills Crushed in Puree for safer swallowing. Support w/ feeding at meals -- check for oral clearing during/post intake. NSG updated. GERD precautions. ST services recommends follow w/ Palliative Care for [place] at discharge to discuss the impact of Dementia and swallowing w/ family, pt. Largely suspect that pt's Advanced Dementia could hamper oral intake, meeting hydration needs, and upgrade of diet. Precautions posted in room. The above was discussed w/ NSG, MD.       HPI HPI: Pt is a 87 y.o. Caucasian male with medical history significant for Advanced Dementia, hypertension, dyslipidemia, BPH, anxiety, GERD, HOH, and osteoarthritis, who presented to the emergency room from [facility], with acute onset of altered mental status with decreased activity as well as lethargy. Pt being treated for acute metabolic encephalopathy possibly 2/2 to UTI & AKI.  Head CT: no acute changes.  CXR: No active disease. Cardiomegaly.      SLP Plan  Continue with current plan of care       Recommendations  Diet recommendations: Dysphagia 1 (puree);Nectar-thick liquid Liquids provided via: Teaspoon;No straw Medication Administration: Crushed with puree Supervision: Staff to assist with self feeding;Full supervision/cueing for compensatory strategies Compensations: Minimize environmental distractions;Slow rate;Small sips/bites;Lingual sweep for clearance of pocketing;Multiple dry swallows after each bite/sip;Follow solids with liquid Postural Changes and/or Swallow Maneuvers: Out of bed for meals;Seated upright 90 degrees;Upright 30-60 min after meal                General recommendations:  (Palliative Care  consult;  Dietician f/u) Oral Care Recommendations: Oral care QID;Oral care before and after PO;Staff/trained caregiver to provide oral care Follow up Recommendations: Skilled Nursing facility (TBD) SLP Visit Diagnosis: Dysphagia, oropharyngeal phase (R13.12) (Dementia - Cognitive decline) Plan: Continue with current plan of care       GO                 Orinda Kenner, [place], [place] Pathologist Rehab Services [phone] [facility] 10/02/2020, 4:31 PM

## 2020-10-02 NOTE — Progress Notes (Signed)
Physical Therapy Treatment Patient Details Name: Billy Bennett. MRN: [id number] DOB: 12-Jul-1923 Today's Date: 10/02/2020    History of Present Illness Pt is a 87 y/o M admitted on 09/28/20 with c/c of AMS. Pt being treated for acute metabolic encephalopathy possibly 2/2 to UTI & AKI. PMH: dementia, HTN, dyslipidemia, BPH, anxiety, OA, GERD, systolic heart murmur, HOH, skin CA    PT Comments    Pt seen for PT evaluation with pt able to verbalize a couple simple words but unable to state name nor age. Pt requires total assist for supine<>sit & rolling in bed with PT attempting to perform hand over hand to facilitate hand placement on bed rails with supine>sit but pt unable to follow cuing & participate. Pt requires total assist static sitting EOB, unable to follow verbal or visual cuing to participate in sitting balance activity nor BLE LAQ. Pt does verbalize request to lie back down. Pt noted to be incontinent of BM smear & incontinent of urine. PT provides dependent assist for peri hygiene and changing into clean gown. Continue to recommend STR upon d/c to maximize independence with mobility, decrease fall risk & reduce caregiver burden.    Follow Up Recommendations  SNF     Equipment Recommendations   (TBD in next venue)    Recommendations for Other Services       Precautions / Restrictions Precautions Precautions: Fall Restrictions Weight Bearing Restrictions: No    Mobility  Bed Mobility Overal bed mobility: Needs Assistance Bed Mobility: Supine to Sit;Sit to Supine;Rolling Rolling: Total assist   Supine to sit: Total assist;HOB elevated Sit to supine: Total assist;HOB elevated   General bed mobility comments: PT attempts to place hands on bed rails to faciliate transitional movement but pt unable to participate even with hand over hand.    Transfers                    Ambulation/Gait                 Stairs             Wheelchair Mobility     Modified Rankin (Stroke Patients Only)       Balance Overall balance assessment: Needs assistance Sitting-balance support: Feet supported;Bilateral upper extremity supported Sitting balance-Leahy Scale: Zero Sitting balance - Comments: max assist static sitting, pt with posterior lean and BLE rising up off of floor, R lateral lean in sitting                                    Cognition Arousal/Alertness: Awake/alert Behavior During Therapy: Flat affect Overall Cognitive Status: History of cognitive impairments - at baseline                                 General Comments: Pt HOH & hx of dementia. Able to verbalize a couple simple words today but otherwise mumbles, unable to recall age nor state name.      Exercises      General Comments        Pertinent Vitals/Pain Pain Assessment: Faces Faces Pain Scale: Hurts a little bit Pain Location: generalized pain Pain Descriptors / Indicators: Grimacing Pain Intervention(s): Repositioned    Home Living  Prior Function            PT Goals (current goals can now be found in the care plan section) Acute Rehab PT Goals PT Goal Formulation: Patient unable to participate in goal setting Time For Goal Achievement: 10/13/20 Progress towards PT goals: Progressing toward goals    Frequency    Min 2X/week      PT Plan Current plan remains appropriate    Co-evaluation              AM-PAC PT "6 Clicks" Mobility   Outcome Measure  Help needed turning from your back to your side while in a flat bed without using bedrails?: Total Help needed moving from lying on your back to sitting on the side of a flat bed without using bedrails?: Total Help needed moving to and from a bed to a chair (including a wheelchair)?: Total Help needed standing up from a chair using your arms (e.g., wheelchair or bedside chair)?: Total Help needed to walk in hospital room?:  Total Help needed climbing 3-5 steps with a railing? : Total 6 Click Score: 6    End of Session   Activity Tolerance: Patient tolerated treatment well Patient left: in bed;with call bell/phone within reach;with bed alarm set   PT Visit Diagnosis: Difficulty in walking, not elsewhere classified (R26.2);Muscle weakness (generalized) (M62.81);Other abnormalities of gait and mobility (R26.89)     Time: 2336-1224 PT Time Calculation (min) (ACUTE ONLY): 17 min  Charges:  $Therapeutic Activity: 8-22 mins                     Lavone Nian, PT, DPT 10/02/20, 11:10 AM    Waunita Schooner 10/02/2020, 11:08 AM

## 2020-10-02 NOTE — TOC Progression Note (Signed)
Transition of Care (TOC) - Progression Note    Patient Details  Name: Billy Bennett. MRN: [id number] Date of Birth: 1923-07-16  Transition of Care [facility]) CM/SW [place], RN Phone Number: 10/02/2020, 4:44 PM  Clinical Narrative:   Billy Bennett will not accept patient, SNF workup will begin, message left for POA         Expected Discharge Plan and Services                                                 Social Determinants of Health (SDOH) Interventions    Readmission Risk Interventions No flowsheet data found.

## 2020-10-02 NOTE — TOC Progression Note (Signed)
Transition of Care (TOC) - Progression Note    Patient Details  Name: Billy Bennett. MRN: [id number] Date of Birth: July 06, 1923  Transition of Care [facility]) CM/SW [place], RN Phone Number: 10/02/2020, 1:17 PM  Clinical Narrative:   [facility]iel [facility] contacted for patient's return as patient's POA is amenable to return, but facility stated they must assess patient first.  Care team has stated patient will be medically ready to move back to facility or be admitted to SNF if facility will not take patient.  TOC to follow to discharge.         Expected Discharge Plan and Services                                                 Social Determinants of Health (SDOH) Interventions    Readmission Risk Interventions No flowsheet data found.

## 2020-10-02 NOTE — Progress Notes (Signed)
PROGRESS NOTE    Billy Bennett.  KCL:[id number] DOB: Jul 11, 1923 DOA: 09/28/2020 PCP: Gayland Curry, MD    Assessment & Plan:   Active Problems:   AMS (altered mental status)   Pressure injury of skin  Acute metabolic encephalopathy: etiology unclear, likely secondary to worsening dementia. Na level is back WNL. Urine cx shows no growth. Palliative care consulted   Possible UTI: completed abx course   Likely CKD: baseline Cr/GFR is unknown, currently IIIa. Cr continues to trend down daily   Thrombocytopenia: resolved   Hypernatremia: resolved  Likely dysphagia: likely secondary to dementia. Started on dysphagia I diet as per speech   Dementia: advanced. Continue w/ supportive care. Palliative care consulted   HTN: continue on home dose of amlodipine, hydralazine. Continue to hold home dose of HCTZ  BPH: continue on home dose of finasteride, tamsulosin   GERD: continue on PPI   HLD:  continue on statin   Peripheral neuropathy: will restart home dose of gabapentin at d/c   DVT prophylaxis: lovenox  Code Status: full  Family Communication: discussed pt's care w/ pt's nephew, Richard ([place]) and answered his questions  Disposition Plan: likely back to home facility  Level of care: Med-Surg   Status is: Inpatient  Remains inpatient appropriate because:Unsafe d/c plan, IV treatments appropriate due to intensity of illness or inability to take PO, and Inpatient level of care appropriate due to severity of illness  Dispo: The patient is from: SNF              Anticipated d/c is to: SNF              Patient currently is not medically stable to d/c.   Difficult to place patient : unclear    Consultants:    Procedures:   Antimicrobials: rocephin   Subjective: Pt is oriented to self only   Objective: Vitals:   10/02/20 0425 10/02/20 0809 10/02/20 1027 10/02/20 1146  BP: (!) 171/92 (!) 160/87 (!) 158/97 124/70  Pulse: 90 90 86 87  Resp: 18 16  16    Temp: (!) 97.5 F (36.4 C) 98.5 F (36.9 C)  97.9 F (36.6 C)  TempSrc:      SpO2: 97% 91%  93%  Weight:      Height:        Intake/Output Summary (Last 24 hours) at 10/02/2020 1237 Last data filed at 10/02/2020 0014 Gross per 24 hour  Intake 250 ml  Output 900 ml  Net -650 ml   Filed Weights   09/28/20 1730 09/28/20 2331  Weight: 61.2 kg 59.8 kg    Examination:  General exam:  Appears confused but comfortable  Respiratory system: clear breath sounds b/l Cardiovascular system: S1/S2+. No rubs or clicks  Gastrointestinal system: Abd is soft, NT, ND & normal bowel sounds  Central nervous system: Alert and oriented. Moves all extremities   Psychiatry: judgement and insight appear abnormal. Flat mood and affect     Data Reviewed: I have personally reviewed following labs and imaging studies  CBC: Recent Labs  Lab 09/28/20 1700 09/29/20 0414 09/30/20 0527 10/01/20 0534 10/02/20 0617  WBC 7.0 5.3 7.6 8.1 9.3  NEUTROABS 5.5  --   --   --   --   HGB 12.3* 11.5* 11.3* 11.2* 12.3*  HCT 38.6* 35.4* 34.0* 33.7* 36.7*  MCV 94.1 93.2 90.9 91.1 90.2  PLT 175 171 157 148* 494   Basic Metabolic Panel: Recent Labs  Lab 09/28/20 1700 09/29/20 0414  09/30/20 0527 10/01/20 0534 10/02/20 0617  NA 146* 151* 142 144 141  K 3.7 4.0 3.5 3.3* 3.6  CL 113* 119* 112* 111 107  CO2 23 22 26 26 25   GLUCOSE 101* 61* 98 87 108*  BUN 46* 43* 28* 22 14  CREATININE 1.50* 1.33* 1.40* 1.10 0.87  CALCIUM 9.0 8.7* 8.6* 9.0 8.9   GFR: Estimated Creatinine Clearance: 43.9 mL/min (by C-G formula based on SCr of 0.87 mg/dL). Liver Function Tests: No results for input(s): AST, ALT, ALKPHOS, BILITOT, PROT, ALBUMIN in the last 168 hours. No results for input(s): LIPASE, AMYLASE in the last 168 hours. No results for input(s): AMMONIA in the last 168 hours. Coagulation Profile: No results for input(s): INR, PROTIME in the last 168 hours. Cardiac Enzymes: No results for input(s): CKTOTAL,  CKMB, CKMBINDEX, TROPONINI in the last 168 hours. BNP (last 3 results) No results for input(s): PROBNP in the last 8760 hours. HbA1C: No results for input(s): HGBA1C in the last 72 hours. CBG: No results for input(s): GLUCAP in the last 168 hours. Lipid Profile: No results for input(s): CHOL, HDL, LDLCALC, TRIG, CHOLHDL, LDLDIRECT in the last 72 hours. Thyroid Function Tests: No results for input(s): TSH, T4TOTAL, FREET4, T3FREE, THYROIDAB in the last 72 hours. Anemia Panel: No results for input(s): VITAMINB12, FOLATE, FERRITIN, TIBC, IRON, RETICCTPCT in the last 72 hours. Sepsis Labs: Recent Labs  Lab 09/28/20 1703  LATICACIDVEN 0.8    Recent Results (from the past 240 hour(s))  Urine culture     Status: None   Collection Time: 09/28/20  5:00 PM   Specimen: Urine, Random  Result Value Ref Range Status   Specimen Description   Final    URINE, RANDOM Performed at [facility], [address], [place], [place] [address]    Special Requests   Final    Normal Performed at [facility], [address] [place], [place] [address]    Culture   Final    NO GROWTH Performed at [facility], [place] [address], [place], [place] [address]    Report Status 09/30/2020 FINAL  Final  Resp Panel by RT-PCR (Flu A&B, Covid) Nasopharyngeal Swab     Status: None   Collection Time: 09/28/20  6:58 PM   Specimen: Nasopharyngeal Swab; Nasopharyngeal(NP) swabs in vial transport medium  Result Value Ref Range Status   SARS Coronavirus 2 by RT PCR NEGATIVE NEGATIVE Final    Comment: (NOTE) SARS-CoV-2 target nucleic acids are NOT DETECTED.  The SARS-CoV-2 RNA is generally detectable in upper respiratory specimens during the acute phase of infection. The lowest concentration of SARS-CoV-2 viral copies this assay can detect is 138 copies/mL. A negative result does not preclude SARS-Cov-2 infection and should not be used as the sole basis for treatment or other patient  management decisions. A negative result may occur with  improper specimen collection/handling, submission of specimen other than nasopharyngeal swab, presence of viral mutation(s) within the areas targeted by this assay, and inadequate number of viral copies(<138 copies/mL). A negative result must be combined with clinical observations, patient history, and epidemiological information. The expected result is Negative.  Fact Sheet for Patients:  EntrepreneurPulse.com.au  Fact Sheet for Healthcare Providers:  IncredibleEmployment.be  This test is no t yet approved or cleared by the [place] FDA and  has been authorized for detection and/or diagnosis of SARS-CoV-2 by FDA under an Emergency Use Authorization (EUA). This EUA will remain  in effect (meaning this test can be  used) for the duration of the COVID-19 declaration under Section 564(b)(1) of the Act, 21 U.S.C.section 360bbb-3(b)(1), unless the authorization is terminated  or revoked sooner.       Influenza A by PCR NEGATIVE NEGATIVE Final   Influenza B by PCR NEGATIVE NEGATIVE Final    Comment: (NOTE) The Xpert Xpress SARS-CoV-2/FLU/RSV plus assay is intended as an aid in the diagnosis of influenza from Nasopharyngeal swab specimens and should not be used as a sole basis for treatment. Nasal washings and aspirates are unacceptable for Xpert Xpress SARS-CoV-2/FLU/RSV testing.  Fact Sheet for Patients: EntrepreneurPulse.com.au  Fact Sheet for Healthcare Providers: IncredibleEmployment.be  This test is not yet approved or cleared by the [place] FDA and has been authorized for detection and/or diagnosis of SARS-CoV-2 by FDA under an Emergency Use Authorization (EUA). This EUA will remain in effect (meaning this test can be used) for the duration of the COVID-19 declaration under Section 564(b)(1) of the Act, 21 U.S.C. section 360bbb-3(b)(1),  unless the authorization is terminated or revoked.  Performed at [facility], [address] [place], [place] [address]          Radiology Studies: No results found.      Scheduled Meds:  amLODipine  10 mg Oral Daily   enoxaparin (LOVENOX) injection  40 mg Subcutaneous Q24H   ferrous sulfate  325 mg Oral Q breakfast   finasteride  5 mg Oral Daily   hydrALAZINE  25 mg Oral TID   pantoprazole  40 mg Oral Daily   pravastatin  10 mg Oral QHS   tamsulosin  0.4 mg Oral Daily   Continuous Infusions:  dextrose 5 % and 0.9% NaCl 50 mL/hr at 10/02/20 0626     LOS: 4 days    Time spent: 20 mins    Wyvonnia Dusky, MD [facility] Pager [phone]  If 7PM-7AM, please contact night-coverage 10/02/2020, 12:37 PM

## 2020-10-03 DIAGNOSIS — R638 Other symptoms and signs concerning food and fluid intake: Secondary | ICD-10-CM

## 2020-10-03 DIAGNOSIS — Z7189 Other specified counseling: Secondary | ICD-10-CM

## 2020-10-03 DIAGNOSIS — Z515 Encounter for palliative care: Secondary | ICD-10-CM

## 2020-10-03 LAB — CBC
HCT: 33.3 % — ABNORMAL LOW (ref 39.0–52.0)
Hemoglobin: 11.2 g/dL — ABNORMAL LOW (ref 13.0–17.0)
MCH: 30.4 pg (ref 26.0–34.0)
MCHC: 33.6 g/dL (ref 30.0–36.0)
MCV: 90.2 fL (ref 80.0–100.0)
Platelets: 176 10*3/uL (ref 150–400)
RBC: 3.69 MIL/uL — ABNORMAL LOW (ref 4.22–5.81)
RDW: 16.6 % — ABNORMAL HIGH (ref 11.5–15.5)
WBC: 7.4 10*3/uL (ref 4.0–10.5)
nRBC: 0 % (ref 0.0–0.2)

## 2020-10-03 LAB — BASIC METABOLIC PANEL
Anion gap: 6 (ref 5–15)
BUN: 17 mg/dL (ref 8–23)
CO2: 24 mmol/L (ref 22–32)
Calcium: 8.5 mg/dL — ABNORMAL LOW (ref 8.9–10.3)
Chloride: 111 mmol/L (ref 98–111)
Creatinine, Ser: 0.92 mg/dL (ref 0.61–1.24)
GFR, Estimated: >60 mL/min (ref >60)
Glucose, Bld: 111 mg/dL — ABNORMAL HIGH (ref 70–99)
Potassium: 3.1 mmol/L — ABNORMAL LOW (ref 3.5–5.1)
Sodium: 141 mmol/L (ref 135–145)

## 2020-10-03 MED ORDER — HALOPERIDOL LACTATE 2 MG/ML PO CONC
0.5000 mg | ORAL | Status: DC | PRN
  Filled 2020-10-03: qty 0.3

## 2020-10-03 MED ORDER — HALOPERIDOL 0.5 MG PO TABS
0.5000 mg | ORAL_TABLET | ORAL | Status: DC | PRN
  Filled 2020-10-03: qty 1

## 2020-10-03 MED ORDER — GLYCOPYRROLATE 1 MG PO TABS
1.0000 mg | ORAL_TABLET | ORAL | Status: DC | PRN
  Filled 2020-10-03: qty 1

## 2020-10-03 MED ORDER — BIOTENE DRY MOUTH MT LIQD: 15.0000 mL | OROMUCOSAL | Status: DC | PRN

## 2020-10-03 MED ORDER — GLYCOPYRROLATE 0.2 MG/ML IJ SOLN: 0.2000 mg | INTRAMUSCULAR | Status: DC | PRN

## 2020-10-03 MED ORDER — MORPHINE SULFATE (CONCENTRATE) 10 MG/0.5ML PO SOLN
2.5000 mg | ORAL | Status: DC | PRN
Start: 2020-10-03 — End: 2020-10-10
  Administered 2020-10-04: 18:00:00 2.6 mg via SUBLINGUAL
  Administered 2020-10-09: 5 mg via SUBLINGUAL
  Filled 2020-10-03 (×2): qty 0.5

## 2020-10-03 MED ORDER — HALOPERIDOL LACTATE 5 MG/ML IJ SOLN
0.5000 mg | INTRAMUSCULAR | Status: DC | PRN
  Administered 2020-10-09: 02:00:00 0.5 mg via INTRAVENOUS
  Filled 2020-10-03: qty 1

## 2020-10-03 MED ORDER — POLYVINYL ALCOHOL 1.4 % OP SOLN
1.0000 [drp] | Freq: Four times a day (QID) | OPHTHALMIC | Status: DC | PRN
  Filled 2020-10-03: qty 15

## 2020-10-03 MED ORDER — NEPRO/CARBSTEADY PO LIQD
237.0000 mL | Freq: Two times a day (BID) | ORAL | Status: DC
  Administered 2020-10-05 – 2020-10-09 (×4): 237 mL via ORAL

## 2020-10-03 MED ORDER — POTASSIUM CHLORIDE CRYS ER 20 MEQ PO TBCR
40.0000 meq | EXTENDED_RELEASE_TABLET | Freq: Two times a day (BID) | ORAL | Status: DC
  Administered 2020-10-03: 40 meq via ORAL
  Filled 2020-10-03: qty 2

## 2020-10-03 NOTE — Consult Note (Signed)
Consultation Note Date: 10/03/2020   Patient Name: Billy Bennett.  DOB: [birth year]  MRN: [id number]  Age / Sex: 87 y.o., male  PCP: Gayland Curry, MD Referring Physician: Wyvonnia Dusky, MD  Reason for Consultation: Establishing goals of care  HPI/Patient Profile: 87 y.o. male  with past medical history of dementia, hypertension, dyslipidemia, osteoarthritis, BPH, anxiety admitted on 09/28/2020 from with altered mental status related to UTI and acute kidney injury.   Clinical Assessment and Goals of Care: I met today at Billy Bennett's bedside along with his friend, Billy Bennett. Billy Bennett does awaken and open his eyes and attempts speech but mumbled and difficult to understand. He comments often "yes siree" or "Oh Lord, yes." He makes no specific comments and although I do feel that Billy Bennett is familiar to him he is unable to tell me Billy Bennett's name. Billy Bennett does smile often and laughs appearing to be in overall good spirits. Billy Bennett reports that he visit with Billy Bennett almost daily and notes decline since stroke ~2 years ago when he moved into ALF. He has had continued decline over the past months and weeks especially. He is wheelchair/bed bound, incontinent, and unable to feed himself. He needs assistance with all ADLs. He was conversant and able to speak with his sister over the phone up until a few weeks ago per Billy Bennett. He is now requiring dysphagia 1, nectar thick diet and I discussed with Billy Bennett the struggle with this diet for Billy Bennett to obtain adequate nutrition and hydration needs.   I called and spoke with HCPOA/nephew, Billy Bennett. Billy Bennett and I speak about his uncle's advance dementia and the likelihood that his uncle will not be able to obtain enough calories or fluids to thrive. I fear that he will easily become dehydrated and this will lead to further decline and renal failure. Billy Bennett  understands that his uncle is nearing end of life with end stage dementia. Billy Bennett notes that his uncle did not even recognize him last time he saw him. Billy Bennett asks about next steps noting frustration that he cannot return to previous facility. I discussed with Billy Bennett the option of hospice support and hospice philosophy of providing comfort and dignity but not measures to prolong life. Billy Bennett shares that he feels this is best as he does not feel this is a quality of life that his uncle would want for himself. Billy Bennett would like to pursue placement in local hospice facility for end of life care. Billy Bennett also confirms that we can discuss and share plans with friend, Billy Bennett, who has been a great support to his uncle.   All questions/concerns addressed. Emotional support provided. Updated Dr. Jimmye Norman and [place], [facility].   Primary Decision Maker HCPOA nephew Billy Bennett    SUMMARY [place] focused care desired.  - Family request transition to local hospice facility.   Code Status/Advance Care Planning: DNR   Symptom Management:  Back Pain: Tylenol as needed. Turn frequently.   Palliative Prophylaxis:  Aspiration, Bowel Regimen,  Delirium Protocol, Frequent Pain Assessment, Oral Care, and Turn Reposition  Additional Recommendations (Limitations, Scope, Preferences): Full Comfort Care and Minimize Medications  Psycho-social/Spiritual:  Desire for further Chaplaincy support:yes Additional Recommendations: Education on Hospice and Grief/Bereavement Support  Prognosis:  < 2 weeks likely with progressing dementia and poor fluid intake (now requiring nectar thickened fluids.   Discharge Planning: Hospice facility      Primary Diagnoses: Present on Admission:  AMS (altered mental status)   I have reviewed the medical record, interviewed the patient and family, and examined the patient. The following aspects are pertinent.  Past Medical History:  Diagnosis Date    Anxiety    Arthritis    Dementia ([place])    DNR no code (do not resuscitate)    Enlarged prostate    GERD (gastroesophageal reflux disease)    Heart murmur, systolic 53/61/4431   HOH (hard of hearing)    Hypercholesteremia    Hypertension    Other constipation 07/01/2017   Pure hypercholesterolemia 04/01/2011   Skin cancer 08/22/2016   Overview:  Patient sees Dr. Phillip Heal   Social History   Socioeconomic History   Marital status: Widowed    Spouse name: Not on file   Number of children: Not on file   Years of education: Not on file   Highest education level: Not on file  Occupational History   Not on file  Tobacco Use   Smoking status: Former    Pack years: 0.00    Types: Cigarettes    Quit date: 04/14/1977    Years since quitting: 43.5   Smokeless tobacco: Never  Vaping Use   Vaping Use: Never used  Substance and Sexual Activity   Alcohol use: No   Drug use: No   Sexual activity: Not Currently  Other Topics Concern   Not on file  Social History Narrative   Not on file   Social Determinants of Health   Financial Resource Strain: Not on file  Food Insecurity: Not on file  Transportation Needs: Not on file  Physical Activity: Not on file  Stress: Not on file  Social Connections: Not on file   Family History  Problem Relation Age of Onset   Alzheimer's disease Father    Prostate cancer Brother    Scheduled Meds:  amLODipine  10 mg Oral Daily   enoxaparin (LOVENOX) injection  40 mg Subcutaneous Q24H   ferrous sulfate  325 mg Oral Q breakfast   finasteride  5 mg Oral Daily   hydrALAZINE  25 mg Oral TID   pantoprazole  40 mg Oral Daily   potassium chloride  40 mEq Oral BID   pravastatin  10 mg Oral QHS   tamsulosin  0.4 mg Oral Daily   Continuous Infusions:  dextrose 5 % and 0.9% NaCl 50 mL/hr at 10/03/20 1031   PRN Meds:.acetaminophen **OR** acetaminophen, magnesium hydroxide, ondansetron **OR** ondansetron (ZOFRAN) IV, traZODone Allergies  Allergen  Reactions   Ace Inhibitors Cough   Telmisartan-Hctz Cough    Other Reaction: ARBS - COUGH   Amoxicillin-Pot Clavulanate Diarrhea    Has patient had a PCN reaction causing immediate rash, facial/tongue/throat swelling, SOB or lightheadedness with hypotension:unsure Has patient had a PCN reaction causing severe rash involving mucus membranes or skin necrosis:unsure Has patient had a PCN reaction that required hospitalization:unsure  Has patient had a PCN reaction occurring within the last 10 years:Yes If all of the above answers are "NO", then may proceed with Cephalosporin use.    Ibuprofen  Other (See Comments)    Ankle swelling ( can take naprosyn)   Review of Systems  Unable to perform ROS: Dementia   Physical Exam Vitals and nursing note reviewed.  Constitutional:      General: He is not in acute distress.    Appearance: He is ill-appearing.  Cardiovascular:     Rate and Rhythm: Normal rate.  Pulmonary:     Effort: No tachypnea, accessory muscle usage or respiratory distress.  Abdominal:     General: Abdomen is flat.     Palpations: Abdomen is soft.  Neurological:     Mental Status: He is lethargic, disoriented and confused.     Comments: Mumbles - difficult to fully assess orientation.     Vital Signs: BP 138/65 (BP Location: Right Arm)   Pulse 87   Temp 98.6 F (37 C) (Oral)   Resp 20   Ht _0  (1.651 m)   Wt 59.8 kg   SpO2 95%   BMI 21.94 kg/m  Pain Scale: PAINAD   Pain Score: 0-No pain   SpO2: SpO2: 95 % O2 Device:SpO2: 95 % O2 Flow Rate: .   IO: Intake/output summary:  Intake/Output Summary (Last 24 hours) at 10/03/2020 1031 Last data filed at 10/03/2020 1031 Gross per 24 hour  Intake 2251.07 ml  Output 300 ml  Net 1951.07 ml    LBM: Last BM Date: 10/02/20 Baseline Weight: Weight: 61.2 kg Most recent weight: Weight: 59.8 kg     Palliative Assessment/Data:     Time In/Out: 1030-1110, 1300-1330 Time Total: 70 min Greater than 50%  of this  time was spent counseling and coordinating care related to the above assessment and plan.  Signed by: Vinie Sill, NP Palliative Medicine Team Pager # [phone] (M-F 8a-5p) Team Phone # [phone] (Nights/Weekends)

## 2020-10-03 NOTE — Progress Notes (Signed)
Physical Therapy Treatment Patient Details Name: Billy Bennett. MRN: [id number] DOB: [birth year] Today's Date: 10/03/2020    History of Present Illness Pt is a 87 y/o M admitted on 09/28/20 with c/c of AMS. Pt being treated for acute metabolic encephalopathy possibly 2/2 to UTI & AKI. PMH: dementia, HTN, dyslipidemia, BPH, anxiety, OA, GERD, systolic heart murmur, HOH, skin CA    PT Comments    Patient has made no progress with functional independence. Patient remains lethargic and continues to require total assistance with mobility efforts and has a hard time maintaining alertness for meaningful PT interaction.  Patient's family has now chosen comfort focused care with transition to local hospice facility per chart. No further acute PT needs at this time.     Follow Up Recommendations  Supervision/Assistance - 24 hour;SNF     Equipment Recommendations  None recommended by PT    Recommendations for Other Services       Precautions / Restrictions Precautions Precautions: Fall Restrictions Weight Bearing Restrictions: No    Mobility  Bed Mobility Overal bed mobility: Needs Assistance Bed Mobility: Supine to Sit;Sit to Supine     Supine to sit: Total assist;HOB elevated Sit to supine: Total assist;HOB elevated   General bed mobility comments: no active participation with bed mobility efforts despite multimodal cueing and extra time provided. total assistance for all mobility at this time    Transfers                 General transfer comment: unable/unsafe to attempt due to poor participation  Ambulation/Gait                 Stairs             Wheelchair Mobility    Modified Rankin (Stroke Patients Only)       Balance Overall balance assessment: Needs assistance Sitting-balance support: Feet supported;Bilateral upper extremity supported Sitting balance-Leahy Scale: Poor Sitting balance - Comments: patient required assistance to  maintain sitting balance but does have protecting righting reactions and activate core muscle to assist with sitting upright in midline position. Postural control: Left lateral lean                                  Cognition Arousal/Alertness: Lethargic Behavior During Therapy: Flat affect Overall Cognitive Status: History of cognitive impairments - at baseline                                 General Comments: patient has difficulty following single step commands      Exercises      General Comments        Pertinent Vitals/Pain Pain Assessment:  (unable to state. no signs or symptoms of pain) Faces Pain Scale: No hurt    Home Living                      Prior Function            PT Goals (current goals can now be found in the care plan section) Acute Rehab PT Goals Patient Stated Goal: none stated PT Goal Formulation: Patient unable to participate in goal setting Progress towards PT goals: Not progressing toward goals - comment    Frequency     (will discharge from PT at this time as family has chosen comfort focused  measures at this time and family request transition to local hospice facility)      PT Plan Other (comment);Current plan remains appropriate    Co-evaluation              AM-PAC PT "6 Clicks" Mobility   Outcome Measure  Help needed turning from your back to your side while in a flat bed without using bedrails?: Total Help needed moving from lying on your back to sitting on the side of a flat bed without using bedrails?: Total Help needed moving to and from a bed to a chair (including a wheelchair)?: Total Help needed standing up from a chair using your arms (e.g., wheelchair or bedside chair)?: Total Help needed to walk in hospital room?: Total Help needed climbing 3-5 steps with a railing? : Total 6 Click Score: 6    End of Session   Activity Tolerance: Patient limited by lethargy Patient left: in  bed;with call bell/phone within reach;with bed alarm set;with family/visitor present Nurse Communication: Mobility status PT Visit Diagnosis: Difficulty in walking, not elsewhere classified (R26.2);Muscle weakness (generalized) (M62.81);Other abnormalities of gait and mobility (R26.89)     Time: 7062-3762 PT Time Calculation (min) (ACUTE ONLY): 26 min  Charges:  $Therapeutic Activity: 23-37 mins                     Minna Merritts, PT, MPT    Percell Locus 10/03/2020, 1:33 PM

## 2020-10-03 NOTE — Progress Notes (Signed)
Speech Language Pathology Treatment: Dysphagia  Patient Details Name: Billy Bennett. MRN: [id number] DOB: [birth year] Today's Date: 10/03/2020 Time: 4765-4650 SLP Time Calculation (min) (ACUTE ONLY): 30 min  Assessment / Plan / Recommendation Clinical Impression  Pt seen for ongoing assessment of toleration of po's; safety for oral diet. Pt continues to appear to present w/ oropharyngeal phase dysphagia in light of Significantly declined Cognitive status; Advanced Dementia at his Baseline per chart. During this admission, pt has had increased wet/phlegmy vocal quality and respirations impacting his upper airway. He appears to have less upper airway mucous/phlegm today than at previous sessions. Advanced Cognitive decline and Respiratory issues impact his overall awareness/engagement, and his safety, during po tasks which increases risk for aspiration, choking. Pt's risk for aspiration, as well as meeting nutritional needs fully, is present but can be reduced when following general aspiration precautions, given feeding assistance, and using a modified diet consistency. He required Mod+ tactile/verbal/ visual cues for orientation to bolus presentation, and during feeding support d/t Cognitive decline and easily distracted presentation. Family arrived at end of session; update/education given.   Pt consumed trials of purees and TSP sips of Nectar liquids. Overt coughing (delayed) noted x2 w/ b/t trials but not immediate to either nectar liquids or purees given. Suspect delayed pharyngeal swallow initiation; impact from Cognitive decline. No consistent, overt clinical s/s of aspiration noted w/ trials given today; no decline in respiratory status during/post trials overall. Oral phase was grossly adequate for bolus management and oral clearing of the boluses given though overall decreased awareness in bolus acceptance and bolus prep noted; prolonged time orally.      D/t pt's declined Cognitive  status, and his risk for aspiration, recommend re-initiation of the dysphagia level 1(puree) w/ Nectar liquids by TSP; aspiration precautions; reduce Distractions during meals and only give po's when pt is alert. Pills Crushed in Puree for safer swallowing. Support w/ feeding at meals -- check for oral clearing during/post intake. NSG updated. GERD precautions. ST services recommends follow w/ Palliative Care for [place] at discharge to discuss the impact of Dementia and swallowing w/ family, pt. Largely suspect that pt's Advanced Dementia could hamper oral intake, meeting hydration needs, and upgrade of diet. Precautions posted in room. The above was discussed w/ NSG, CM, and MD.      HPI HPI: Pt is a 87 y.o. Caucasian male with medical history significant for Advanced Dementia, hypertension, dyslipidemia, BPH, anxiety, GERD, HOH, and osteoarthritis, who presented to the emergency room [facility], with acute onset of altered mental status with decreased activity as well as lethargy. Pt being treated for acute metabolic encephalopathy possibly 2/2 to UTI & AKI.  Head CT: no acute changes.  CXR: No active disease. Cardiomegaly.      SLP Plan  Continue with current plan of care       Recommendations  Diet recommendations: Dysphagia 1 (puree);Nectar-thick liquid Liquids provided via: Teaspoon;No straw Medication Administration: Crushed with puree Supervision: Staff to assist with self feeding;Full supervision/cueing for compensatory strategies Compensations: Minimize environmental distractions;Slow rate;Small sips/bites;Lingual sweep for clearance of pocketing;Multiple dry swallows after each bite/sip;Follow solids with liquid Postural Changes and/or Swallow Maneuvers: Out of bed for meals;Seated upright 90 degrees;Upright 30-60 min after meal                General recommendations:  (Palliative Care for [place]; Dietician f/u) Oral Care Recommendations: Oral care QID;Oral care before  and after PO;Staff/trained caregiver to provide oral care Follow up  Recommendations: Skilled Nursing facility (TBD) SLP Visit Diagnosis: Dysphagia, oropharyngeal phase (R13.12) (Dementia; Cognitive decline) Plan: Continue with current plan of care       GO                 Orinda Kenner, [place], [place] Pathologist Rehab Services [phone] [facility] 10/03/2020, 11:29 AM

## 2020-10-03 NOTE — Care Management Important Message (Signed)
Important Message  Patient Details  Name: Billy Bennett. MRN: [id number] Date of Birth: 05-Apr-1924   Medicare Important Message Given:  Other (see comment)  Patient placed on comfort care today and will transfer to the [facility] when a bed is available. Out of respect for the patient and family no Important Message from [facility] given.  Juliann Pulse A Alysen Smylie 10/03/2020, 2:32 PM

## 2020-10-03 NOTE — Progress Notes (Signed)
OT Cancellation Note  Patient Details Name: Billy Bennett. MRN: [id number] DOB: 1923/10/20   Cancelled Treatment:    Reason Eval/Treat Not Completed: Patient not medically ready. Per MD during rounds, therapy to sign off. Will complete current OT order.   Hanley Hays, MPH, MS, OTR/L ascom [phone] 10/03/20, 11:42 AM

## 2020-10-03 NOTE — NC FL2 (Signed)
[place] LEVEL OF CARE SCREENING TOOL     IDENTIFICATION  Patient Name: Billy Bennett. Birthdate: [birth year] Sex: male Admission Date (Current Location): 09/28/2020  [facility] [place] Number:      Facility and Address:         Provider Number:    Attending Physician Name and Address:  Wyvonnia Dusky, MD  Relative Name and Phone Number:       Current Level of Care:   Recommended Level of Care:   Prior Approval Number:    Date Approved/Denied:   PASRR Number: [id number]  Discharge Plan:      Current Diagnoses: Patient Active Problem List   Diagnosis Date Noted   Pressure injury of skin 09/29/2020   AMS (altered mental status) 09/28/2020   UTI (urinary tract infection) 10/19/2019   Hemorrhagic stroke ([place])    Traumatic rhabdomyolysis ([place])    Gastroesophageal reflux disease without esophagitis    Head injury    Acute metabolic encephalopathy 64/33/2951   Hypokalemia 02/27/2019   Elevated troponin 02/27/2019   Fall    At high risk for falls 08/28/2017   Fecal smearing 07/01/2017   Other constipation 07/01/2017   Heart murmur, systolic 88/41/6606   Skin cancer 08/22/2016   Status post arthroscopy 07/16/2016   Pure hypercholesterolemia 04/01/2011   Hypertension 01/20/2011    Orientation RESPIRATION BLADDER Height & Weight     Self (Disoriented to time, situation and place, memory impairment)  Normal Incontinent, External catheter Weight: 59.8 kg Height:  5\' 5"  (165.1 cm)  BEHAVIORAL SYMPTOMS/MOOD NEUROLOGICAL BOWEL NUTRITION STATUS     (memory impairment) Incontinent Diet (Diet recommendations: Dysphagia 1 (puree);Nectar-thick liquid  Liquids provided via: Teaspoon;No straw  Medication Administration: Crushed with puree  Supervision: Staff to assist with self feeding;Full supervision/cueing for compensatory strategies)  AMBULATORY STATUS COMMUNICATION OF NEEDS Skin   Total Care Verbally (Makes sounds not understood) Skin  abrasions, PU Stage and Appropriate Care (abrasion ecchymosis L face, arm and leg, pressure injury buttocks with foam dressing stage 2)                       Personal Care Assistance Level of Assistance  Bathing, Feeding, Dressing Bathing Assistance: Maximum assistance Feeding assistance: Maximum assistance Dressing Assistance: Maximum assistance     Functional Limitations Info  Sight, Hearing, Speech Sight Info: Impaired Hearing Info: Impaired Speech Info: Impaired    SPECIAL CARE FACTORS FREQUENCY                       Contractures Contractures Info: Not present    Additional Factors Info  Code Status, Allergies Code Status Info: DNR Allergies Info: Ace Inhibitors, Telmisartan-hctz,  Amoxicillin-pot Clavulanate,  Has patient had a PCN reaction causing immediate rash, facial/tongue/throat swelling, SOB or lightheadedness with hypotension:unsure Has patient had a PCN reaction causing severe rash involving mucus membranes or skin necrosis:unsure Has patient had a PCN reaction that required hospitalization:unsure Has patient had a PCN reaction occurring within the last 10 yearsthen may proceed with Cephalosporin use, Ibuprofen           Current Medications (10/03/2020):  This is the current hospital active medication list Current Facility-Administered Medications  Medication Dose Route Frequency Provider Last Rate Last Admin   acetaminophen (TYLENOL) tablet 650 mg  650 mg Oral Q6H PRN Mansy, Jan A, MD       Or   acetaminophen (TYLENOL) suppository 650 mg  650 mg Rectal  Q6H PRN Mansy, Jan A, MD       amLODipine (NORVASC) tablet 10 mg  10 mg Oral Daily Mansy, Jan A, MD   10 mg at 10/03/20 0845   dextrose 5 %-0.9 % sodium chloride infusion   Intravenous Continuous Wyvonnia Dusky, MD 50 mL/hr at 10/03/20 0129 New Bag at 10/03/20 0129   enoxaparin (LOVENOX) injection 40 mg  40 mg Subcutaneous Q24H Dallie Piles, RPH   40 mg at 10/02/20 2002   ferrous sulfate tablet  325 mg  325 mg Oral Q breakfast Mansy, Jan A, MD   325 mg at 10/03/20 0845   finasteride (PROSCAR) tablet 5 mg  5 mg Oral Daily Mansy, Jan A, MD   5 mg at 10/03/20 0846   hydrALAZINE (APRESOLINE) tablet 25 mg  25 mg Oral TID Mansy, Jan A, MD   25 mg at 10/03/20 0845   magnesium hydroxide (MILK OF MAGNESIA) suspension 30 mL  30 mL Oral Daily PRN Mansy, Jan A, MD       ondansetron [facility]) tablet 4 mg  4 mg Oral Q6H PRN Mansy, Jan A, MD       Or   ondansetron [facility]) injection 4 mg  4 mg Intravenous Q6H PRN Mansy, Jan A, MD       pantoprazole (PROTONIX) EC tablet 40 mg  40 mg Oral Daily Mansy, Jan A, MD   40 mg at 10/03/20 0844   potassium chloride SA (KLOR-CON) CR tablet 40 mEq  40 mEq Oral BID Wyvonnia Dusky, MD   40 mEq at 10/03/20 0843   pravastatin (PRAVACHOL) tablet 10 mg  10 mg Oral QHS Mansy, Jan A, MD   10 mg at 10/02/20 2002   tamsulosin (FLOMAX) capsule 0.4 mg  0.4 mg Oral Daily Mansy, Jan A, MD   0.4 mg at 10/03/20 0844   traZODone (DESYREL) tablet 25 mg  25 mg Oral QHS PRN Mansy, Arvella Merles, MD         Discharge Medications: Please see discharge summary for a list of discharge medications.  Relevant Imaging Results:  Relevant Lab Results:   Additional Information SS# [id number]   Comp diet notes:Minimize environmental distractions;Slow rate;Small sips/bites;Lingual sweep for clearance of pocketing;Multiple dry swallows after each bite/sip;Follow solids with liquid  Postural Changes and/or Swallow Maneuvers: Out of bed for meals;Seated upright 90 degrees;Upright 30-60 min after meal  Pete Pelt, RN

## 2020-10-03 NOTE — Progress Notes (Signed)
PROGRESS NOTE   HPI was taken from Dr. Sidney Ace: Billy Bennett. is a 87 y.o. Caucasian male with medical history significant for advanced dementia, hypertension, dyslipidemia, BPH, anxiety and osteoarthritis, who presented to the emergency room from [facility], with acute onset of altered mental status with decreased activity as well as lethargy for which EMS was called.  No reported syncope or fall or head injury.  The patient is usually active and engaged at his baseline.  No reported chest pain or palpitations.  No reported cough or wheezing or dyspnea.  The patient was very somnolent but arousable.  He was overall a very poor historian and therefore the much history could be obtained from him.   ED Course: When the patient came to the ER heart rate was 57 with otherwise normal vital signs.  Labs revealed a BUN of 46 and creatinine 1.5 compared to 36/1.33 with lactic acid of 0.8.  CBC showed mild anemia and urinalysis showed 11-20 WBCs and more than 50 RBCs.  There were trace leukocytes and negative nitrites. EKG as reviewed by me : Showed sinus rhythm with a rate of 61 with PVCs Imaging: Chest x-ray showed cardiomegaly with no acute cardiopulmonary disease. Noncontrasted head CT scan showed chronic right ethmoid sinusitis, generalized cerebral volume loss and mild chronic small vessel ischemic changes in the cerebral white matter with no evidence for acute intracranial normality.  The patient was given 1 L bolus of IV normal saline.  Urine culture was ordered as well as 1 g of IV Rocephin.  He will be admitted to a medical monitored bed for further evaluation and management.  Hospital course from Dr. Jimmye Norman 6/17-6/21/22: Pt presented w/ worsening altered mental status of unknown etiology. Pt was found to have hyponatremia and possible UTI. Na level is back WNL and pt has completed a course of abxs for UTI and pt's mental status has not improved. Palliative care was consulted and pt's  MPOA agreed to make the pt comfort care. Will transfer to hospice facility when and if bed is available.   Billy Bennett.  TGG:[id number] DOB: [birth year] DOA: 09/28/2020 PCP: Gayland Curry, MD    Assessment & Plan:   Active Problems:   AMS (altered mental status)   Pressure injury of skin Failure to thrive: secondary to below. Will start comfort care today. Unable to return to [facility].  Acute metabolic encephalopathy: etiology unclear, likely secondary to worsening dementia. Na level is back WNL. Urine cx shows no growth.   Possible UTI: completed abx course   Likely CKD: baseline Cr/GFR is unknown, currently IIIa. Cr continues to trend down daily   Thrombocytopenia: resolved   Hypernatremia: resolved  Likely dysphagia: likely secondary to dementia. Started on dysphagia I diet as per speech   Dementia: advanced. Continue w/ supportive care. Starting comfort care today   HTN:  starting comfort care today   BPH: continue on home dose of finasteride, tamsulosin   GERD: started on comfort care today   HLD:  started on comfort care today   Peripheral neuropathy: started on comfort care today   DVT prophylaxis: lovenox  Code Status: full  Family Communication:  Disposition Plan:d/c to hospice facility when bed available   Level of care: Med-Surg   Status is: Inpatient  Remains inpatient appropriate because:Unsafe d/c plan, IV treatments appropriate due to intensity of illness or inability to take PO, and Inpatient level of care appropriate due to severity of illness  Dispo: The  patient is from: SNF              Anticipated d/c is to: hospice facility              Patient currently: stable to d/c to hospice facility    Difficult to place patient : unclear    Consultants:    Procedures:   Antimicrobials:    Subjective: Pt is oriented to self only   Objective: Vitals:   10/02/20 1803 10/02/20 1939 10/02/20 2337 10/03/20 0339  BP: (!) 150/78  140/73 (!) 144/71 127/83  Pulse: 80 80 86 88  Resp:  16 20 (!) 22  Temp:  98.3 F (36.8 C) 98.5 F (36.9 C) 99.2 F (37.3 C)  TempSrc:      SpO2:  97% 98% 93%  Weight:      Height:        Intake/Output Summary (Last 24 hours) at 10/03/2020 0726 Last data filed at 10/03/2020 0626 Gross per 24 hour  Intake 0 ml  Output 300 ml  Net -300 ml   Filed Weights   09/28/20 1730 09/28/20 2331  Weight: 61.2 kg 59.8 kg    Examination:  General exam:  Appears confused Respiratory system: diminished breath sounds b/l  Cardiovascular system: S1 & S2+. No rubs or clicks  Gastrointestinal system: Abd is soft, NT, ND & hypoactive bowel sounds Central nervous system: Lethargic. Moves all extremities  Psychiatry: judgement and insight appear abnormal. Flat mood and affect     Data Reviewed: I have personally reviewed following labs and imaging studies  CBC: Recent Labs  Lab 09/28/20 1700 09/29/20 0414 09/30/20 0527 10/01/20 0534 10/02/20 0617 10/03/20 0522  WBC 7.0 5.3 7.6 8.1 9.3 7.4  NEUTROABS 5.5  --   --   --   --   --   HGB 12.3* 11.5* 11.3* 11.2* 12.3* 11.2*  HCT 38.6* 35.4* 34.0* 33.7* 36.7* 33.3*  MCV 94.1 93.2 90.9 91.1 90.2 90.2  PLT 175 171 157 148* 166 948   Basic Metabolic Panel: Recent Labs  Lab 09/29/20 0414 09/30/20 0527 10/01/20 0534 10/02/20 0617 10/03/20 0522  NA 151* 142 144 141 141  K 4.0 3.5 3.3* 3.6 3.1*  CL 119* 112* 111 107 111  CO2 22 26 26 25 24   GLUCOSE 61* 98 87 108* 111*  BUN 43* 28* 22 14 17   CREATININE 1.33* 1.40* 1.10 0.87 0.92  CALCIUM 8.7* 8.6* 9.0 8.9 8.5*   GFR: Estimated Creatinine Clearance: 41.5 mL/min (by C-G formula based on SCr of 0.92 mg/dL). Liver Function Tests: No results for input(s): AST, ALT, ALKPHOS, BILITOT, PROT, ALBUMIN in the last 168 hours. No results for input(s): LIPASE, AMYLASE in the last 168 hours. No results for input(s): AMMONIA in the last 168 hours. Coagulation Profile: No results for input(s):  INR, PROTIME in the last 168 hours. Cardiac Enzymes: No results for input(s): CKTOTAL, CKMB, CKMBINDEX, TROPONINI in the last 168 hours. BNP (last 3 results) No results for input(s): PROBNP in the last 8760 hours. HbA1C: No results for input(s): HGBA1C in the last 72 hours. CBG: No results for input(s): GLUCAP in the last 168 hours. Lipid Profile: No results for input(s): CHOL, HDL, LDLCALC, TRIG, CHOLHDL, LDLDIRECT in the last 72 hours. Thyroid Function Tests: No results for input(s): TSH, T4TOTAL, FREET4, T3FREE, THYROIDAB in the last 72 hours. Anemia Panel: No results for input(s): VITAMINB12, FOLATE, FERRITIN, TIBC, IRON, RETICCTPCT in the last 72 hours. Sepsis Labs: Recent Labs  Lab 09/28/20 1703  LATICACIDVEN 0.8    Recent Results (from the past 240 hour(s))  Urine culture     Status: None   Collection Time: 09/28/20  5:00 PM   Specimen: Urine, Random  Result Value Ref Range Status   Specimen Description   Final    URINE, RANDOM Performed at [facility], [address] [place], [place] [address]    Special Requests   Final    Normal Performed at [facility], [address], [place], [place] [address]    Culture   Final    NO GROWTH Performed at [facility], [place] [address], [place], [place] [address]    Report Status 09/30/2020 FINAL  Final  Resp Panel by RT-PCR (Flu A&B, Covid) Nasopharyngeal Swab     Status: None   Collection Time: 09/28/20  6:58 PM   Specimen: Nasopharyngeal Swab; Nasopharyngeal(NP) swabs in vial transport medium  Result Value Ref Range Status   SARS Coronavirus 2 by RT PCR NEGATIVE NEGATIVE Final    Comment: (NOTE) SARS-CoV-2 target nucleic acids are NOT DETECTED.  The SARS-CoV-2 RNA is generally detectable in upper respiratory specimens during the acute phase of infection. The lowest concentration of SARS-CoV-2 viral copies this assay can detect is 138 copies/mL. A negative result does not preclude  SARS-Cov-2 infection and should not be used as the sole basis for treatment or other patient management decisions. A negative result may occur with  improper specimen collection/handling, submission of specimen other than nasopharyngeal swab, presence of viral mutation(s) within the areas targeted by this assay, and inadequate number of viral copies(<138 copies/mL). A negative result must be combined with clinical observations, patient history, and epidemiological information. The expected result is Negative.  Fact Sheet for Patients:  EntrepreneurPulse.com.au  Fact Sheet for Healthcare Providers:  IncredibleEmployment.be  This test is no t yet approved or cleared by the [place] FDA and  has been authorized for detection and/or diagnosis of SARS-CoV-2 by FDA under an Emergency Use Authorization (EUA). This EUA will remain  in effect (meaning this test can be used) for the duration of the COVID-19 declaration under Section 564(b)(1) of the Act, 21 U.S.C.section 360bbb-3(b)(1), unless the authorization is terminated  or revoked sooner.       Influenza A by PCR NEGATIVE NEGATIVE Final   Influenza B by PCR NEGATIVE NEGATIVE Final    Comment: (NOTE) The Xpert Xpress SARS-CoV-2/FLU/RSV plus assay is intended as an aid in the diagnosis of influenza from Nasopharyngeal swab specimens and should not be used as a sole basis for treatment. Nasal washings and aspirates are unacceptable for Xpert Xpress SARS-CoV-2/FLU/RSV testing.  Fact Sheet for Patients: EntrepreneurPulse.com.au  Fact Sheet for Healthcare Providers: IncredibleEmployment.be  This test is not yet approved or cleared by the [place] FDA and has been authorized for detection and/or diagnosis of SARS-CoV-2 by FDA under an Emergency Use Authorization (EUA). This EUA will remain in effect (meaning this test can be used) for the duration of  the COVID-19 declaration under Section 564(b)(1) of the Act, 21 U.S.C. section 360bbb-3(b)(1), unless the authorization is terminated or revoked.  Performed at [facility], [address], [place], [place] [address]          Radiology Studies: No results found.      Scheduled Meds:  amLODipine  10 mg Oral Daily   enoxaparin (LOVENOX) injection  40 mg Subcutaneous Q24H   ferrous sulfate  325 mg Oral Q breakfast   finasteride  5  mg Oral Daily   hydrALAZINE  25 mg Oral TID   pantoprazole  40 mg Oral Daily   potassium chloride  40 mEq Oral BID   pravastatin  10 mg Oral QHS   tamsulosin  0.4 mg Oral Daily   Continuous Infusions:  dextrose 5 % and 0.9% NaCl 50 mL/hr at 10/03/20 0129     LOS: 5 days    Time spent: 20 mins    Wyvonnia Dusky, MD [facility] Pager [phone]  If 7PM-7AM, please contact night-coverage 10/03/2020, 7:26 AM

## 2020-10-03 NOTE — TOC Progression Note (Signed)
Transition of Care (TOC) - Progression Note    Patient Details  Name: Billy Bennett. MRN: [id number] Date of Birth: 02/27/1924  Transition of Care [facility]) CM/SW Contact  Pete Pelt, RN Phone Number: 10/03/2020, 3:30 PM  Clinical Narrative:   Billy Bennett at Palliative referred patient for [facility] in [place].  Billy Bennett will contact HCPOA to discuss.  Billy Bennett at [facility] notified.  Awaiting placement at this time  TOC to follow through discharge.         Expected Discharge Plan and Services                                                 Social Determinants of Health (SDOH) Interventions    Readmission Risk Interventions No flowsheet data found.

## 2020-10-03 NOTE — Progress Notes (Signed)
[facility] Liaison Note:  Received request from Dreama Saa, RN Transitions of Care manager for family interest in the [place]. Hospice eligibility confirmed.   Spoke with patient's nephew/HCPOA Cindie Laroche to confirm interest and explain services.   Unfortunately Hospice Home is not able to offer a room today. Family and Dreama Saa, RN Transitions of Care manager aware hospital liaison will follow up tomorrow or sooner if room becomes available.   Please do not hesitate to call with questions.   Thank you for the opportunity to participate in this patient's care.  Bobbie "Loren Racer, RN, BSN [facility] Liaison [phone]

## 2020-10-04 DIAGNOSIS — Z515 Encounter for palliative care: Secondary | ICD-10-CM

## 2020-10-04 DIAGNOSIS — R627 Adult failure to thrive: Secondary | ICD-10-CM

## 2020-10-04 DIAGNOSIS — N189 Chronic kidney disease, unspecified: Secondary | ICD-10-CM

## 2020-10-04 DIAGNOSIS — L89302 Pressure ulcer of unspecified buttock, stage 2: Secondary | ICD-10-CM

## 2020-10-04 NOTE — Progress Notes (Signed)
AndPatient ID: Billy Bennett., male   DOB: [birth year], 87 y.o.   MRN: [id number] [facility] PROGRESS NOTE  Billy Bennett. GYJ:[id number] DOB: 1923-04-22 DOA: 09/28/2020 PCP: Gayland Curry, MD  HPI/Subjective: Patient.  Did not answer quite a few questions that I heard.  Said okay when I was trying to see if we can get him out of the hospital to the hospice home.  Unfortunately no hospice home bed availability today.  Initially admitted with altered mental status and acute kidney injury.  Objective: Vitals:   10/03/20 1155 10/04/20 0550  BP: (!) 141/70 (!) 147/63  Pulse: 81 88  Resp: 20 19  Temp: 99.9 F (37.7 C) 98.8 F (37.1 C)  SpO2: 96% 91%   No intake or output data in the 24 hours ending 10/04/20 1535 Filed Weights   09/28/20 1730 09/28/20 2331  Weight: 61.2 kg 59.8 kg    ROS: Review of Systems  Unable to perform ROS: Acuity of condition  Exam: Physical Exam HENT:     Head: Normocephalic.     Mouth/Throat:     Pharynx: No oropharyngeal exudate.  Eyes:     General: Lids are normal.     Conjunctiva/sclera: Conjunctivae normal.  Cardiovascular:     Rate and Rhythm: Normal rate and regular rhythm.     Heart sounds: Normal heart sounds, S1 normal and S2 normal.  Pulmonary:     Breath sounds: Normal breath sounds. No decreased breath sounds, wheezing, rhonchi or rales.  Abdominal:     Palpations: Abdomen is soft.     Tenderness: There is no abdominal tenderness.  Musculoskeletal:     Right lower leg: No swelling.     Left lower leg: No swelling.  Skin:    General: Skin is warm.     Findings: No rash.  Neurological:     Mental Status: He is lethargic.     Data Reviewed: Basic Metabolic Panel: Recent Labs  Lab 09/29/20 0414 09/30/20 0527 10/01/20 0534 10/02/20 0617 10/03/20 0522  NA 151* 142 144 141 141  K 4.0 3.5 3.3* 3.6 3.1*  CL 119* 112* 111 107 111  CO2 22 26 26 25 24   GLUCOSE 61* 98 87 108* 111*  BUN 43* 28* 22 14 17    CREATININE 1.33* 1.40* 1.10 0.87 0.92  CALCIUM 8.7* 8.6* 9.0 8.9 8.5*    CBC: Recent Labs  Lab 09/28/20 1700 09/29/20 0414 09/30/20 0527 10/01/20 0534 10/02/20 0617 10/03/20 0522  WBC 7.0 5.3 7.6 8.1 9.3 7.4  NEUTROABS 5.5  --   --   --   --   --   HGB 12.3* 11.5* 11.3* 11.2* 12.3* 11.2*  HCT 38.6* 35.4* 34.0* 33.7* 36.7* 33.3*  MCV 94.1 93.2 90.9 91.1 90.2 90.2  PLT 175 171 157 148* 166 176     Recent Results (from the past 240 hour(s))  Urine culture     Status: None   Collection Time: 09/28/20  5:00 PM   Specimen: Urine, Random  Result Value Ref Range Status   Specimen Description   Final    URINE, RANDOM Performed at [facility], [address] [place], [place] [address]    Special Requests   Final    Normal Performed at [facility], [address], [place], [place] [address]    Culture   Final    NO GROWTH Performed at [facility], [place] [address], [place], [place] [address]    Report Status  09/30/2020 FINAL  Final  Resp Panel by RT-PCR (Flu A&B, Covid) Nasopharyngeal Swab     Status: None   Collection Time: 09/28/20  6:58 PM   Specimen: Nasopharyngeal Swab; Nasopharyngeal(NP) swabs in vial transport medium  Result Value Ref Range Status   SARS Coronavirus 2 by RT PCR NEGATIVE NEGATIVE Final    Comment: (NOTE) SARS-CoV-2 target nucleic acids are NOT DETECTED.  The SARS-CoV-2 RNA is generally detectable in upper respiratory specimens during the acute phase of infection. The lowest concentration of SARS-CoV-2 viral copies this assay can detect is 138 copies/mL. A negative result does not preclude SARS-Cov-2 infection and should not be used as the sole basis for treatment or other patient management decisions. A negative result may occur with  improper specimen collection/handling, submission of specimen other than nasopharyngeal swab, presence of viral mutation(s) within the areas targeted by this assay, and inadequate number  of viral copies(<138 copies/mL). A negative result must be combined with clinical observations, patient history, and epidemiological information. The expected result is Negative.  Fact Sheet for Patients:  EntrepreneurPulse.com.au  Fact Sheet for Healthcare Providers:  IncredibleEmployment.be  This test is no t yet approved or cleared by the [place] FDA and  has been authorized for detection and/or diagnosis of SARS-CoV-2 by FDA under an Emergency Use Authorization (EUA). This EUA will remain  in effect (meaning this test can be used) for the duration of the COVID-19 declaration under Section 564(b)(1) of the Act, 21 U.S.C.section 360bbb-3(b)(1), unless the authorization is terminated  or revoked sooner.       Influenza A by PCR NEGATIVE NEGATIVE Final   Influenza B by PCR NEGATIVE NEGATIVE Final    Comment: (NOTE) The Xpert Xpress SARS-CoV-2/FLU/RSV plus assay is intended as an aid in the diagnosis of influenza from Nasopharyngeal swab specimens and should not be used as a sole basis for treatment. Nasal washings and aspirates are unacceptable for Xpert Xpress SARS-CoV-2/FLU/RSV testing.  Fact Sheet for Patients: EntrepreneurPulse.com.au  Fact Sheet for Healthcare Providers: IncredibleEmployment.be  This test is not yet approved or cleared by the [place] FDA and has been authorized for detection and/or diagnosis of SARS-CoV-2 by FDA under an Emergency Use Authorization (EUA). This EUA will remain in effect (meaning this test can be used) for the duration of the COVID-19 declaration under Section 564(b)(1) of the Act, 21 U.S.C. section 360bbb-3(b)(1), unless the authorization is terminated or revoked.  Performed at [facility], [address] [place], [place] [address]       Scheduled Meds:  feeding supplement (NEPRO CARB STEADY)  237 mL Oral BID BM   finasteride  5 mg  Oral Daily   tamsulosin  0.4 mg Oral Daily    Assessment/Plan:  End-of-life care.  As needed comfort care measures.  Patient did not eat much today.  Awaiting hospice home bed.  Acute metabolic encephalopathy.  Likely underlying dementia. Failure to thrive Possibility UTI completed antibiotic course.  UA positive but urine culture negative. Acute kidney injury on chronic kidney disease stage II.  Creatinine peaked at 1.4 and down to 0.92. Essential hypertension Dysphagia Hyponatremia Thrombocytopenia BPH GERD Hyperlipidemia Peripheral neuropathy Stage II decubiti buttock, present on admission see description below.  Pressure Injury 09/28/20 Buttocks Stage 2 -  Partial thickness loss of dermis presenting as a shallow open injury with a red, pink wound bed without slough. (Active)  09/28/20 2119  Location: Buttocks  Location Orientation:   Staging: Stage 2 -  Partial thickness loss of  dermis presenting as a shallow open injury with a red, pink wound bed without slough.  Wound Description (Comments):   Present on Admission: Yes       Code Status:     Code Status Orders  (From admission, onward)           Start     Ordered   10/03/20 1337  Do not attempt resuscitation (DNR)  Continuous       Question Answer Comment  In the event of cardiac or respiratory ARREST Do not call a "code blue"   In the event of cardiac or respiratory ARREST Do not perform Intubation, CPR, defibrillation or ACLS   In the event of cardiac or respiratory ARREST Use medication by any route, position, wound care, and other measures to relive pain and suffering. May use oxygen, suction and manual treatment of airway obstruction as needed for comfort.   Comments nurse may pronounce      10/03/20 1338           Code Status History     Date Active Date Inactive Code Status Order ID Comments User Context   09/28/2020 2002 10/03/2020 1338 DNR [id number]  Christel Mormon, MD ED   11/15/2019 2324  11/16/2019 0537 DNR [id number]  Hinda Kehr, MD ED   10/19/2019 0236 10/22/2019 0007 DNR [id number]  Sidney Ace, Arvella Merles, MD ED   02/28/2019 0753 03/03/2019 1943 DNR [id number]  Loletha Grayer, MD Inpatient   02/27/2019 1254 02/28/2019 0753 Full Code [id number]  Para Skeans, MD ED      Advance Directive Documentation    [place] Most Recent Value  Type of Advance Directive Out of facility DNR (pink MOST or yellow form)  Pre-existing out of facility DNR order (yellow form or pink MOST form) --  "MOST" Form in Place? --      Family Communication: Spoke with Cindie Laroche on the phone Disposition Plan: Status is: Inpatient   Dispo: The patient is from: Home              Anticipated d/c is to: Hospice facility              Patient currently medically stable to go to hospice facility once bed available.   Difficult to place patient.  No.  Consultants: Palliative care  Time spent: 26 minutes.  [place]  [facility] [employer]

## 2020-10-04 NOTE — Progress Notes (Signed)
Palliative:  HPI: 87 y.o. male  with past medical history of dementia, hypertension, dyslipidemia, osteoarthritis, BPH, anxiety admitted on 09/28/2020 from with altered mental status related to UTI and acute kidney injury.  I met today at Billy Bennett's bedside. He is resting comfortably but will grin when I call his name but does not open his eyes. His friend, Billy Bennett, is at bedside. I discussed further with Billy Bennett plan for transition to hospice facility and comfort focused care. Billy Bennett ate some chocolate pudding but only a couple spoonfuls of liquid and this is all he has had today. This will not be enough to sustain him or keep his kidneys going. Billy Bennett understands. Billy Bennett is saddened to see his friend decline to this stage but is appreciative of the care he has received. I ensure Billy Bennett that he will continue to get good care at hospice when bed available. Billy Bennett would like to be notified of transfer and any changes in status.   All questions/concerns addressed. Emotional support provided.   Exam: Sleeping. Mumbles when awake enough but more lethargic today vs yesterday. No distress. Breathing regular, unlabored. Abd soft, flat.   Plan: - Transition to hospice facility when bed available.  - Very poor intake - prognosis still expected to be < 2 weeks.  - Comfort medications available as needed.   15 min  Vinie Sill, NP Palliative Medicine Team Pager [phone] (Please see [email or web address] for schedule) Team Phone [phone]    Greater than 50%  of this time was spent counseling and coordinating care related to the above assessment and plan

## 2020-10-04 NOTE — Progress Notes (Signed)
[facility] ([facility])  There is not a bed open today at [facility] for Billy Bennett.  Once a bed is open, [facility] will update his family and hospital staff.  Thank you, Venia Carbon RN, BSN, [facility] Liaison

## 2020-10-04 NOTE — TOC Progression Note (Signed)
Transition of Care (TOC) - Progression Note    Patient Details  Name: Billy Bennett. MRN: [id number] Date of Birth: Oct 22, 1923  Transition [facility]) CM/SW Contact  Pete Pelt, RN Phone Number: 10/04/2020, 10:23 AM  Clinical Narrative:  Spoke to patient's neighbor, Raynelle Dick today.  He states that he and family are on board for hospice house.  Hospice house states patient is next on list, will speak with neighbor today about transfer.  TOC will follow.          Expected Discharge Plan and Services                                                 Social Determinants of Health (SDOH) Interventions    Readmission Risk Interventions No flowsheet data found.

## 2020-10-05 NOTE — Progress Notes (Addendum)
[facility] ([facility])  Pt remains appropriate for residential hospice.  Still awaiting to see if we have an open bed today.   [facility] will update family and hospital staff once bed status has been determined today.  Venia Carbon RN, BSN, [facility] Liaison  **update, 1220pm, there is not a bed at [facility] today. [facility] will update family and hospital once bed status changes.

## 2020-10-05 NOTE — Progress Notes (Signed)
Patient ID: Billy Bennett., male   DOB: October 23, 1923, 87 y.o.   MRN: [id number] [facility] PROGRESS NOTE  Billy Bennett. WUJ:[id number] DOB: 1923/05/16 DOA: 09/28/2020 PCP: Gayland Curry, MD  HPI/Subjective: The patient was more alert this morning and able to answer a few questions.  Still with some confusion.  Initially admitted with altered mental status and acute kidney injury.  Objective: Vitals:   10/04/20 2005 10/05/20 0433  BP: (!) 146/73 (!) 141/57  Pulse: 94 87  Resp: 20 16  Temp: 99.3 F (37.4 C) 98.4 F (36.9 C)  SpO2: 95% 93%    Intake/Output Summary (Last 24 hours) at 10/05/2020 1543 Last data filed at 10/05/2020 1039 Gross per 24 hour  Intake 2245 ml  Output 225 ml  Net 2020 ml   Filed Weights   09/28/20 1730 09/28/20 2331  Weight: 61.2 kg 59.8 kg    ROS: Review of Systems  Respiratory:  Negative for shortness of breath.   Cardiovascular:  Negative for chest pain.  Exam: Physical Exam HENT:     Head: Normocephalic.     Mouth/Throat:     Pharynx: No oropharyngeal exudate.  Eyes:     General: Lids are normal.     Conjunctiva/sclera: Conjunctivae normal.  Cardiovascular:     Rate and Rhythm: Normal rate and regular rhythm.     Heart sounds: S1 normal and S2 normal. Murmur heard.  Systolic murmur is present with a grade of 2/6.  Pulmonary:     Breath sounds: Examination of the right-lower field reveals decreased breath sounds. Examination of the left-lower field reveals decreased breath sounds. Decreased breath sounds present. No wheezing, rhonchi or rales.  Abdominal:     Palpations: Abdomen is soft.     Tenderness: There is no abdominal tenderness.  Musculoskeletal:     Right lower leg: No swelling.     Left lower leg: No swelling.  Skin:    General: Skin is warm.     Comments: Scab near his left ear could be concerning for basal cell carcinoma.  Neurological:     Mental Status: He is alert.     Comments: Answers a few  questions.  Difficult to understand.     Data Reviewed: Basic Metabolic Panel: Recent Labs  Lab 09/29/20 0414 09/30/20 0527 10/01/20 0534 10/02/20 0617 10/03/20 0522  NA 151* 142 144 141 141  K 4.0 3.5 3.3* 3.6 3.1*  CL 119* 112* 111 107 111  CO2 22 26 26 25 24   GLUCOSE 61* 98 87 108* 111*  BUN 43* 28* 22 14 17   CREATININE 1.33* 1.40* 1.10 0.87 0.92  CALCIUM 8.7* 8.6* 9.0 8.9 8.5*    CBC: Recent Labs  Lab 09/28/20 1700 09/29/20 0414 09/30/20 0527 10/01/20 0534 10/02/20 0617 10/03/20 0522  WBC 7.0 5.3 7.6 8.1 9.3 7.4  NEUTROABS 5.5  --   --   --   --   --   HGB 12.3* 11.5* 11.3* 11.2* 12.3* 11.2*  HCT 38.6* 35.4* 34.0* 33.7* 36.7* 33.3*  MCV 94.1 93.2 90.9 91.1 90.2 90.2  PLT 175 171 157 148* 166 176     Recent Results (from the past 240 hour(s))  Urine culture     Status: None   Collection Time: 09/28/20  5:00 PM   Specimen: Urine, Random  Result Value Ref Range Status   Specimen Description   Final    URINE, RANDOM Performed at [facility], [address], [place], [place] [address]  Special Requests   Final    Normal Performed at [facility], [address] [place], [place] [address]    Culture   Final    NO GROWTH Performed at [facility], [place] [address] [place], [place] [address]    Report Status 09/30/2020 FINAL  Final  Resp Panel by RT-PCR (Flu A&B, Covid) Nasopharyngeal Swab     Status: None   Collection Time: 09/28/20  6:58 PM   Specimen: Nasopharyngeal Swab; Nasopharyngeal(NP) swabs in vial transport medium  Result Value Ref Range Status   SARS Coronavirus 2 by RT PCR NEGATIVE NEGATIVE Final    Comment: (NOTE) SARS-CoV-2 target nucleic acids are NOT DETECTED.  The SARS-CoV-2 RNA is generally detectable in upper respiratory specimens during the acute phase of infection. The lowest concentration of SARS-CoV-2 viral copies this assay can detect is 138 copies/mL. A negative result does not preclude  SARS-Cov-2 infection and should not be used as the sole basis for treatment or other patient management decisions. A negative result may occur with  improper specimen collection/handling, submission of specimen other than nasopharyngeal swab, presence of viral mutation(s) within the areas targeted by this assay, and inadequate number of viral copies(<138 copies/mL). A negative result must be combined with clinical observations, patient history, and epidemiological information. The expected result is Negative.  Fact Sheet for Patients:  EntrepreneurPulse.com.au  Fact Sheet for Healthcare Providers:  IncredibleEmployment.be  This test is no t yet approved or cleared by the [place] FDA and  has been authorized for detection and/or diagnosis of SARS-CoV-2 by FDA under an Emergency Use Authorization (EUA). This EUA will remain  in effect (meaning this test can be used) for the duration of the COVID-19 declaration under Section 564(b)(1) of the Act, 21 U.S.C.section 360bbb-3(b)(1), unless the authorization is terminated  or revoked sooner.       Influenza A by PCR NEGATIVE NEGATIVE Final   Influenza B by PCR NEGATIVE NEGATIVE Final    Comment: (NOTE) The Xpert Xpress SARS-CoV-2/FLU/RSV plus assay is intended as an aid in the diagnosis of influenza from Nasopharyngeal swab specimens and should not be used as a sole basis for treatment. Nasal washings and aspirates are unacceptable for Xpert Xpress SARS-CoV-2/FLU/RSV testing.  Fact Sheet for Patients: EntrepreneurPulse.com.au  Fact Sheet for Healthcare Providers: IncredibleEmployment.be  This test is not yet approved or cleared by the [place] FDA and has been authorized for detection and/or diagnosis of SARS-CoV-2 by FDA under an Emergency Use Authorization (EUA). This EUA will remain in effect (meaning this test can be used) for the duration of  the COVID-19 declaration under Section 564(b)(1) of the Act, 21 U.S.C. section 360bbb-3(b)(1), unless the authorization is terminated or revoked.  Performed at [facility], [address] [place], [place] [address]       Scheduled Meds:  feeding supplement (NEPRO CARB STEADY)  237 mL Oral BID BM   finasteride  5 mg Oral Daily   tamsulosin  0.4 mg Oral Daily     Assessment/Plan:  End-of-life care.  Patient awaiting hospice home bed availability.  Little more alert today than yesterday. Acute metabolic encephalopathy with underlying dementia Failure to thrive Possibility of UTI completed antibiotic course.  UA positive but urine culture negative. Acute kidney injury on chronic kidney disease stage II.  Creatinine peaked at 1.4 and down to 0.92 Essential hypertension Dysphagia Hypernatremia Thrombocytopenia BPH on finasteride and Flomax GERD Hyperlipidemia Peripheral neuropathy Stage II decubiti on right buttock present on  admission see description below  Pressure Injury 09/28/20 Buttocks Stage 2 -  Partial thickness loss of dermis presenting as a shallow open injury with a red, pink wound bed without slough. (Active)  09/28/20 2119  Location: Buttocks  Location Orientation:   Staging: Stage 2 -  Partial thickness loss of dermis presenting as a shallow open injury with a red, pink wound bed without slough.  Wound Description (Comments):   Present on Admission: Yes       Code Status:     Code Status Orders  (From admission, onward)           Start     Ordered   10/03/20 1337  Do not attempt resuscitation (DNR)  Continuous       Question Answer Comment  In the event of cardiac or respiratory ARREST Do not call a "code blue"   In the event of cardiac or respiratory ARREST Do not perform Intubation, CPR, defibrillation or ACLS   In the event of cardiac or respiratory ARREST Use medication by any route, position, wound care, and other measures to  relive pain and suffering. May use oxygen, suction and manual treatment of airway obstruction as needed for comfort.   Comments nurse may pronounce      10/03/20 1338           Code Status History     Date Active Date Inactive Code Status Order ID Comments User Context   09/28/2020 2002 10/03/2020 1338 DNR [id number]  Christel Mormon, MD ED   11/15/2019 2324 11/16/2019 0537 DNR [id number]  Hinda Kehr, MD ED   10/19/2019 0236 10/22/2019 0007 DNR [id number]  Sidney Ace, Arvella Merles, MD ED   02/28/2019 0753 03/03/2019 1943 DNR [id number]  Loletha Grayer, MD Inpatient   02/27/2019 1254 02/28/2019 0753 Full Code [id number]  Para Skeans, MD ED      Advance Directive Documentation    Flowsheet Row Most Recent Value  Type of Advance Directive Out of facility DNR (pink MOST or yellow form)  Pre-existing out of facility DNR order (yellow form or pink MOST form) --  "MOST" Form in Place? --      Family Communication: Spoke with son on the phone Disposition Plan: Status is: Inpatient  Dispo: The patient is from: Home              Anticipated d/c is to: Hospice home              Patient currently able to go to hospice home when bed available   Difficult to place patient.  Awaiting hospice home bed  Time spent: 26 minutes  [place]

## 2020-10-06 DIAGNOSIS — N400 Enlarged prostate without lower urinary tract symptoms: Secondary | ICD-10-CM

## 2020-10-06 MED ORDER — NEOMYCIN-POLYMYXIN-PRAMOXINE 1 % EX CREA: TOPICAL_CREAM | Freq: Two times a day (BID) | CUTANEOUS | Status: DC

## 2020-10-06 MED ORDER — BACITRACIN-NEOMYCIN-POLYMYXIN 400-5-5000 EX OINT
TOPICAL_OINTMENT | Freq: Two times a day (BID) | CUTANEOUS | Status: DC
  Filled 2020-10-06 (×7): qty 1

## 2020-10-06 NOTE — Progress Notes (Signed)
Pt scratched scab on left cheek/ear area. Small bleeding noted. Cleansed with NSS then covered with mepilex foam.

## 2020-10-06 NOTE — TOC Progression Note (Signed)
Transition of Care (TOC) - Progression Note    Patient Details  Name: Perrin Gens. MRN: [id number] Date of Birth: 08-10-1923  [facility]) CM/SW Contact  Pete Pelt, RN Phone Number: 10/06/2020, 9:56 AM  Clinical Narrative:   As per [facility], they will not have a bed for patient today.  Will wait for hospice house to notify TOC of a bed availability.         Expected Discharge Plan and Services                                                 Social Determinants of Health (SDOH) Interventions    Readmission Risk Interventions Readmission Risk Prevention Plan 10/05/2020  Transportation Screening Complete  PCP or Specialist Appt within 3-5 Days Complete  HRI or [place] Complete  Social Work Consult for [place] Planning/Counseling Not Complete  SW consult not completed comments RNCM assigned to case  Palliative Care Screening Complete  Medication Review Press photographer) Complete  Some recent data might be hidden

## 2020-10-06 NOTE — Progress Notes (Addendum)
[facility] Liaison Note:  Patient remains appropriate for Hospice Home.   Unfortunately, there is a not a room available at the [facility] today. TOC manager may call the Hospice Home at [phone] over the weekend to see if a room is available.   Please do not hesitate to call with any hospice related questions or concerns.   Thank you,   Bobbie "Loren Racer, [facility], BSN [facility] Liaison [phone]

## 2020-10-06 NOTE — Progress Notes (Signed)
Patient ID: Billy Sena., male   DOB: June 22, 1923, 87 y.o.   MRN: [id number] [facility] PROGRESS NOTE  Billy Bennett. [id number]:[id number] DOB: Apr 26, 1923 DOA: 09/28/2020 PCP: Gayland Curry, MD  HPI/Subjective: Patient alert this morning and able to follow some simple commands.  Sometimes when he is talking is difficult to understand.  Initially admitted with altered mental status and acute kidney injury.  Awaiting hospice home bed  Objective: Vitals:   10/05/20 0433 10/05/20 2208  BP: (!) 141/57 (!) 155/72  Pulse: 87 82  Resp: 16 18  Temp: 98.4 F (36.9 C) 98.4 F (36.9 C)  SpO2: 93% 95%    Intake/Output Summary (Last 24 hours) at 10/06/2020 1441 Last data filed at 10/06/2020 0320 Gross per 24 hour  Intake 180 ml  Output 525 ml  Net -345 ml   Filed Weights   09/28/20 1730 09/28/20 2331  Weight: 61.2 kg 59.8 kg    ROS: Review of Systems  Respiratory:  Negative for shortness of breath.   Cardiovascular:  Negative for chest pain.  Gastrointestinal:  Negative for abdominal pain, nausea and vomiting.  Exam: Physical Exam HENT:     Head: Normocephalic.     Mouth/Throat:     Pharynx: No oropharyngeal exudate.  Eyes:     General: Lids are normal.     Conjunctiva/sclera: Conjunctivae normal.  Cardiovascular:     Rate and Rhythm: Normal rate and regular rhythm.     Heart sounds: Murmur heard.  Systolic murmur is present with a grade of 2/6.  Pulmonary:     Breath sounds: No decreased breath sounds, wheezing, rhonchi or rales.  Abdominal:     Palpations: Abdomen is soft.     Tenderness: There is no abdominal tenderness.  Musculoskeletal:     Right lower leg: No swelling.     Left lower leg: No swelling.  Skin:    General: Skin is warm.     Findings: No rash.  Neurological:     Mental Status: He is alert.     Comments: Able to straight leg raise today.     Data Reviewed: Basic Metabolic Panel: Recent Labs  Lab 09/30/20 0527 10/01/20 0534  10/02/20 0617 10/03/20 0522  NA 142 144 141 141  K 3.5 3.3* 3.6 3.1*  CL 112* 111 107 111  CO2 26 26 25 24   GLUCOSE 98 87 108* 111*  BUN 28* 22 14 17   CREATININE 1.40* 1.10 0.87 0.92  CALCIUM 8.6* 9.0 8.9 8.5*    CBC: Recent Labs  Lab 09/30/20 0527 10/01/20 0534 10/02/20 0617 10/03/20 0522  WBC 7.6 8.1 9.3 7.4  HGB 11.3* 11.2* 12.3* 11.2*  HCT 34.0* 33.7* 36.7* 33.3*  MCV 90.9 91.1 90.2 90.2  PLT 157 148* 166 176     Recent Results (from the past 240 hour(s))  Urine culture     Status: None   Collection Time: 09/28/20  5:00 PM   Specimen: Urine, Random  Result Value Ref Range Status   Specimen Description   Final    URINE, RANDOM Performed at [facility], [address] [place], [place] [address]    Special Requests   Final    Normal Performed at [facility], [address] [place], [place] [address]    Culture   Final    NO GROWTH Performed at [facility], [place] [address], [place], [place] [address]    Report Status 09/30/2020 FINAL  Final  Resp Panel  by RT-PCR (Flu A&B, Covid) Nasopharyngeal Swab     Status: None   Collection Time: 09/28/20  6:58 PM   Specimen: Nasopharyngeal Swab; Nasopharyngeal(NP) swabs in vial transport medium  Result Value Ref Range Status   SARS Coronavirus 2 by RT PCR NEGATIVE NEGATIVE Final    Comment: (NOTE) SARS-CoV-2 target nucleic acids are NOT DETECTED.  The SARS-CoV-2 RNA is generally detectable in upper respiratory specimens during the acute phase of infection. The lowest concentration of SARS-CoV-2 viral copies this assay can detect is 138 copies/mL. A negative result does not preclude SARS-Cov-2 infection and should not be used as the sole basis for treatment or other patient management decisions. A negative result may occur with  improper specimen collection/handling, submission of specimen other than nasopharyngeal swab, presence of viral mutation(s) within the areas targeted by this  assay, and inadequate number of viral copies(<138 copies/mL). A negative result must be combined with clinical observations, patient history, and epidemiological information. The expected result is Negative.  Fact Sheet for Patients:  EntrepreneurPulse.com.au  Fact Sheet for Healthcare Providers:  IncredibleEmployment.be  This test is no t yet approved or cleared by the [place] FDA and  has been authorized for detection and/or diagnosis of SARS-CoV-2 by FDA under an Emergency Use Authorization (EUA). This EUA will remain  in effect (meaning this test can be used) for the duration of the COVID-19 declaration under Section 564(b)(1) of the Act, 21 U.S.C.section 360bbb-3(b)(1), unless the authorization is terminated  or revoked sooner.       Influenza A by PCR NEGATIVE NEGATIVE Final   Influenza B by PCR NEGATIVE NEGATIVE Final    Comment: (NOTE) The Xpert Xpress SARS-CoV-2/FLU/RSV plus assay is intended as an aid in the diagnosis of influenza from Nasopharyngeal swab specimens and should not be used as a sole basis for treatment. Nasal washings and aspirates are unacceptable for Xpert Xpress SARS-CoV-2/FLU/RSV testing.  Fact Sheet for Patients: EntrepreneurPulse.com.au  Fact Sheet for Healthcare Providers: IncredibleEmployment.be  This test is not yet approved or cleared by the [place] FDA and has been authorized for detection and/or diagnosis of SARS-CoV-2 by FDA under an Emergency Use Authorization (EUA). This EUA will remain in effect (meaning this test can be used) for the duration of the COVID-19 declaration under Section 564(b)(1) of the Act, 21 U.S.C. section 360bbb-3(b)(1), unless the authorization is terminated or revoked.  Performed at [facility], [address] [place], [place] [address]       Scheduled Meds:  feeding supplement (NEPRO CARB STEADY)  237 mL Oral  BID BM   finasteride  5 mg Oral Daily   tamsulosin  0.4 mg Oral Daily    Assessment/Plan:  End-of-life care.  Still no bed availability at the hospice home.  His facility unable to take him back.  Patient was alert this morning and able to answer few questions. Acute metabolic encephalopathy with underlying dementia Failure to thrive Acute kidney injury on chronic kidney disease stage II.  Creatinine peaked at 1.4 down to 0.92 on last blood draw Essential hypertension Dysphagia Hyponatremia Thrombocytopenia BPH on finasteride and Flomax GERD Hyperlipidemia Peripheral neuropathy Stage II decubiti, present on admission.  See full description below  Pressure Injury 09/28/20 Buttocks Stage 2 -  Partial thickness loss of dermis presenting as a shallow open injury with a red, pink wound bed without slough. (Active)  09/28/20 2119  Location: Buttocks  Location Orientation:   Staging: Stage 2 -  Partial thickness loss of dermis presenting  as a shallow open injury with a red, pink wound bed without slough.  Wound Description (Comments):   Present on Admission: Yes       Code Status:     Code Status Orders  (From admission, onward)           Start     Ordered   10/03/20 1337  Do not attempt resuscitation (DNR)  Continuous       Question Answer Comment  In the event of cardiac or respiratory ARREST Do not call a "code blue"   In the event of cardiac or respiratory ARREST Do not perform Intubation, CPR, defibrillation or ACLS   In the event of cardiac or respiratory ARREST Use medication by any route, position, wound care, and other measures to relive pain and suffering. May use oxygen, suction and manual treatment of airway obstruction as needed for comfort.   Comments nurse may pronounce      10/03/20 1338           Code Status History     Date Active Date Inactive Code Status Order ID Comments User Context   09/28/2020 2002 10/03/2020 1338 DNR [id number]  Christel Mormon,  MD ED   11/15/2019 2324 11/16/2019 0537 DNR [id number]  Hinda Kehr, MD ED   10/19/2019 0236 10/22/2019 0007 DNR [id number]  Sidney Ace, Arvella Merles, MD ED   02/28/2019 0753 03/03/2019 1943 DNR [id number]  Loletha Grayer, MD Inpatient   02/27/2019 1254 02/28/2019 0753 Full Code [id number]  Para Skeans, MD ED      Advance Directive Documentation    Flowsheet Row Most Recent Value  Type of Advance Directive Out of facility DNR (pink MOST or yellow form)  Pre-existing out of facility DNR order (yellow form or pink MOST form) --  "MOST" Form in Place? --      Family Communication: Updated son on the phone Disposition Plan: Status is: Inpatient  Dispo: The patient is from: Home              Anticipated d/c is to: Hospice home when bed available              Patient currently awaiting hospice home bed   Difficult to place patient. Awaiting hospice home bed  Time spent: 24 minutes  [place]

## 2020-10-06 NOTE — Care Management Important Message (Signed)
Important Message  Patient Details  Name: Billy Bennett. MRN: [id number] Date of Birth: [birth year]   Medicare Important Message Given:  Other (see comment)  Transfer to [facility] pending bed availability.  Medicare IM withheld at this time.     Dannette Barbara 10/06/2020, 8:27 AM

## 2020-10-06 NOTE — Progress Notes (Signed)
Pt removed the dressing on his left cheek/ear x 2 so far. Small amount of bleeding noted. Cleansed with NSS. Antibiotic ointment applied as ordered by MD.

## 2020-10-07 MED ORDER — ACETAMINOPHEN 325 MG PO TABS: 650.0000 mg | ORAL_TABLET | Freq: Four times a day (QID) | ORAL | Status: AC | PRN

## 2020-10-07 MED ORDER — POLYVINYL ALCOHOL 1.4 % OP SOLN: 1.0000 [drp] | Freq: Four times a day (QID) | OPHTHALMIC | 0 refills | Status: AC | PRN

## 2020-10-07 MED ORDER — NEPRO/CARBSTEADY PO LIQD: 237.0000 mL | Freq: Two times a day (BID) | ORAL | 0 refills | Status: AC

## 2020-10-07 MED ORDER — BACITRACIN-NEOMYCIN-POLYMYXIN 400-5-5000 EX OINT: TOPICAL_OINTMENT | CUTANEOUS | 0 refills | Status: AC

## 2020-10-07 MED ORDER — GLYCOPYRROLATE 0.2 MG/ML IJ SOLN: 0.2000 mg | INTRAMUSCULAR | Status: AC | PRN

## 2020-10-07 MED ORDER — TRAZODONE HCL 50 MG PO TABS: 25.0000 mg | ORAL_TABLET | Freq: Every evening | ORAL | Status: AC | PRN

## 2020-10-07 MED ORDER — HALOPERIDOL LACTATE 5 MG/ML IJ SOLN: 0.5000 mg | INTRAMUSCULAR | Status: AC | PRN

## 2020-10-07 MED ORDER — BIOTENE DRY MOUTH MT LIQD: 15.0000 mL | OROMUCOSAL | Status: AC | PRN

## 2020-10-07 MED ORDER — MORPHINE SULFATE (CONCENTRATE) 10 MG/0.5ML PO SOLN: 2.5000 mg | ORAL | 0 refills | Status: AC | PRN

## 2020-10-07 NOTE — Progress Notes (Signed)
Patient ID: Billy Lamp., male   DOB: 04/28/1923, 87 y.o.   MRN: [id number] [facility] PROGRESS NOTE  [name] OIZ:[id number] DOB: 28-May-1923 DOA: 09/28/2020 PCP: Gayland Curry, MD  HPI/Subjective: Patient seen this morning before breakfast.  He was more talkative today than I have seen him.  He was following commands and able to straight leg raise.  At times difficult to understand but at times was a bowl to answer me appropriately.  Objective: Vitals:   10/06/20 2115 10/07/20 1142  BP: (!) 155/77 (!) 143/60  Pulse: 80 70  Resp: 20 15  Temp: 98.4 F (36.9 C) 98.2 F (36.8 C)  SpO2: 94% 96%    Intake/Output Summary (Last 24 hours) at 10/07/2020 1527 Last data filed at 10/07/2020 0856 Gross per 24 hour  Intake 715 ml  Output 700 ml  Net 15 ml   Filed Weights   09/28/20 1730 09/28/20 2331  Weight: 61.2 kg 59.8 kg    ROS: Review of Systems  Respiratory:  Negative for shortness of breath.   Cardiovascular:  Negative for chest pain.  Gastrointestinal:  Negative for abdominal pain.  Exam: Physical Exam HENT:     Head: Normocephalic.     Mouth/Throat:     Pharynx: No oropharyngeal exudate.  Eyes:     General: Lids are normal.     Conjunctiva/sclera: Conjunctivae normal.  Cardiovascular:     Rate and Rhythm: Normal rate and regular rhythm.     Heart sounds: Normal heart sounds, S1 normal and S2 normal.  Pulmonary:     Breath sounds: No decreased breath sounds, wheezing, rhonchi or rales.  Abdominal:     Palpations: Abdomen is soft.     Tenderness: There is no abdominal tenderness.  Musculoskeletal:     Right lower leg: No swelling.     Left lower leg: No swelling.  Skin:    General: Skin is warm.     Comments: Area near left ear scabbing.  Neurological:     Mental Status: He is alert.     Comments: Able to straight leg raise to command.     Data Reviewed: Basic Metabolic Panel: Recent Labs  Lab 10/01/20 0534 10/02/20 0617  10/03/20 0522  NA 144 141 141  K 3.3* 3.6 3.1*  CL 111 107 111  CO2 26 25 24   GLUCOSE 87 108* 111*  BUN 22 14 17   CREATININE 1.10 0.87 0.92  CALCIUM 9.0 8.9 8.5*    CBC: Recent Labs  Lab 10/01/20 0534 10/02/20 0617 10/03/20 0522  WBC 8.1 9.3 7.4  HGB 11.2* 12.3* 11.2*  HCT 33.7* 36.7* 33.3*  MCV 91.1 90.2 90.2  PLT 148* 166 176     Recent Results (from the past 240 hour(s))  Urine culture     Status: None   Collection Time: 09/28/20  5:00 PM   Specimen: Urine, Random  Result Value Ref Range Status   Specimen Description   Final    URINE, RANDOM Performed at [facility], [address] [place], [place] [address]    Special Requests   Final    Normal Performed at [facility], [address] [place], [place] [address]    Culture   Final    NO GROWTH Performed at [facility], [place] [address] [place], [place] [address]    Report Status 09/30/2020 FINAL  Final  Resp Panel by RT-PCR (Flu A&B, Covid) Nasopharyngeal Swab     Status: None  Collection Time: 09/28/20  6:58 PM   Specimen: Nasopharyngeal Swab; Nasopharyngeal(NP) swabs in vial transport medium  Result Value Ref Range Status   SARS Coronavirus 2 by RT PCR NEGATIVE NEGATIVE Final    Comment: (NOTE) SARS-CoV-2 target nucleic acids are NOT DETECTED.  The SARS-CoV-2 RNA is generally detectable in upper respiratory specimens during the acute phase of infection. The lowest concentration of SARS-CoV-2 viral copies this assay can detect is 138 copies/mL. A negative result does not preclude SARS-Cov-2 infection and should not be used as the sole basis for treatment or other patient management decisions. A negative result may occur with  improper specimen collection/handling, submission of specimen other than nasopharyngeal swab, presence of viral mutation(s) within the areas targeted by this assay, and inadequate number of viral copies(<138 copies/mL). A negative result must be  combined with clinical observations, patient history, and epidemiological information. The expected result is Negative.  Fact Sheet for Patients:  EntrepreneurPulse.com.au  Fact Sheet for Healthcare Providers:  IncredibleEmployment.be  This test is no t yet approved or cleared by the [place] FDA and  has been authorized for detection and/or diagnosis of SARS-CoV-2 by FDA under an Emergency Use Authorization (EUA). This EUA will remain  in effect (meaning this test can be used) for the duration of the COVID-19 declaration under Section 564(b)(1) of the Act, 21 U.S.C.section 360bbb-3(b)(1), unless the authorization is terminated  or revoked sooner.       Influenza A by PCR NEGATIVE NEGATIVE Final   Influenza B by PCR NEGATIVE NEGATIVE Final    Comment: (NOTE) The Xpert Xpress SARS-CoV-2/FLU/RSV plus assay is intended as an aid in the diagnosis of influenza from Nasopharyngeal swab specimens and should not be used as a sole basis for treatment. Nasal washings and aspirates are unacceptable for Xpert Xpress SARS-CoV-2/FLU/RSV testing.  Fact Sheet for Patients: EntrepreneurPulse.com.au  Fact Sheet for Healthcare Providers: IncredibleEmployment.be  This test is not yet approved or cleared by the [place] FDA and has been authorized for detection and/or diagnosis of SARS-CoV-2 by FDA under an Emergency Use Authorization (EUA). This EUA will remain in effect (meaning this test can be used) for the duration of the COVID-19 declaration under Section 564(b)(1) of the Act, 21 U.S.C. section 360bbb-3(b)(1), unless the authorization is terminated or revoked.  Performed at [facility], [address] [place], [place] [address]       Scheduled Meds:  feeding supplement (NEPRO CARB STEADY)  237 mL Oral BID BM   finasteride  5 mg Oral Daily   neomycin-bacitracin-polymyxin   Topical BID    tamsulosin  0.4 mg Oral Daily    Assessment/Plan:  End-of-life care.  Next on the list to go to the hospice home but currently no beds available.  His facility unable to take him back.  Patient's alert this morning and able to answer questions and follow some commands. Acute metabolic encephalopathy with underlying dementia.  This seems to be better today than on Wednesday when I first saw him. Failure to thrive Acute kidney injury on chronic kidney disease stage II.  Creatinine peaked at 1.4 and down to 0.92 on last blood draw. Essential hypertension Dysphagia Hypernatremia Thrombocytopenia BPH on finasteride and Flomax GERD Hyperlipidemia Peripheral neuropathy Stage II decubiti, present on admission see full description below  Pressure Injury 09/28/20 Buttocks Stage 2 -  Partial thickness loss of dermis presenting as a shallow open injury with a red, pink wound bed without slough. (Active)  09/28/20 2119  Location: Buttocks  Location Orientation:   Staging: Stage 2 -  Partial thickness loss of dermis presenting as a shallow open injury with a red, pink wound bed without slough.  Wound Description (Comments):   Present on Admission: Yes       Code Status:     Code Status Orders  (From admission, onward)           Start     Ordered   10/03/20 1337  Do not attempt resuscitation (DNR)  Continuous       Question Answer Comment  In the event of cardiac or respiratory ARREST Do not call a "code blue"   In the event of cardiac or respiratory ARREST Do not perform Intubation, CPR, defibrillation or ACLS   In the event of cardiac or respiratory ARREST Use medication by any route, position, wound care, and other measures to relive pain and suffering. May use oxygen, suction and manual treatment of airway obstruction as needed for comfort.   Comments nurse may pronounce      10/03/20 1338           Code Status History     Date Active Date Inactive Code Status Order ID  Comments User Context   09/28/2020 2002 10/03/2020 1338 DNR [id number]  Christel Mormon, MD ED   11/15/2019 2324 11/16/2019 0537 DNR [id number]  Hinda Kehr, MD ED   10/19/2019 0236 10/22/2019 0007 DNR [id number]  Sidney Ace, Arvella Merles, MD ED   02/28/2019 0753 03/03/2019 1943 DNR [id number]  Loletha Grayer, MD Inpatient   02/27/2019 1254 02/28/2019 0753 Full Code [id number]  Para Skeans, MD ED      Advance Directive Documentation    Flowsheet Row Most Recent Value  Type of Advance Directive Out of facility DNR (pink MOST or yellow form)  Pre-existing out of facility DNR order (yellow form or pink MOST form) --  "MOST" Form in Place? --      Family Communication: Updated son on the phone Disposition Plan: Status is: Inpatient  Dispo: The patient is from: Assisted living              Anticipated d/c is to: Hospice home              Patient currently waiting for bed availability at the hospice home   Difficult to place patient.  No.  Time spent: 25 minutes  [place]

## 2020-10-07 NOTE — TOC Progression Note (Signed)
Transition of Care (TOC) - Progression Note    Patient Details  Name: Billy Bennett. MRN: [id number] Date of Birth: July 27, 1923  Transition of Care [facility]) CM/SW Contact  Shelbie Hutching, RN Phone Number: 10/07/2020, 2:18 PM  Clinical Narrative:    Donella Stade called [facility] to check on bed availability.  Patient is the first on the list for the next bed that comes open.          Expected Discharge Plan and Services                                                 Social Determinants of Health (SDOH) Interventions    Readmission Risk Interventions Readmission Risk Prevention Plan 10/05/2020  Transportation Screening Complete  PCP or Specialist Appt within 3-5 Days Complete  HRI or [place] Complete  Social Work Consult for [place] Planning/Counseling Not Complete  SW consult not completed comments RNCM assigned to case  Palliative Care Screening Complete  Medication Review Press photographer) Complete  Some recent data might be hidden

## 2020-10-08 ENCOUNTER — Encounter: Payer: Self-pay | Admitting: Family Medicine

## 2020-10-08 ENCOUNTER — Other Ambulatory Visit: Payer: Self-pay

## 2020-10-08 NOTE — Progress Notes (Signed)
Patient ID: Billy Embleton., male   DOB: Oct 03, 1923, 88 y.o.   MRN: [id number] [facility] PROGRESS NOTE  Billy Bennett. Billy [id number] DOB: 09/15/1923 DOA: 09/28/2020 PCP: Gayland Curry, MD  HPI/Subjective: Patient unresponsive to sternal rub today.  When I walked by the room later morning he was still unresponsive.  Objective: Vitals:   10/08/20 0757 10/08/20 1145  BP: 126/61 (!) 172/87  Pulse: 62 74  Resp: 16 16  Temp: 98.5 F (36.9 C) 97.9 F (36.6 C)  SpO2: 95% 95%    Intake/Output Summary (Last 24 hours) at 10/08/2020 1356 Last data filed at 10/08/2020 1347 Gross per 24 hour  Intake 240 ml  Output 400 ml  Net -160 ml   Filed Weights   09/28/20 1730 09/28/20 2331  Weight: 61.2 kg 59.8 kg    ROS: Review of Systems  Unable to perform ROS: Acuity of condition  Exam: Physical Exam HENT:     Mouth/Throat:     Pharynx: No oropharyngeal exudate.  Eyes:     General: Lids are normal.     Comments: Resisted me opening up his eyes  Cardiovascular:     Rate and Rhythm: Normal rate and regular rhythm.     Heart sounds: Normal heart sounds, S1 normal and S2 normal.  Pulmonary:     Breath sounds: Examination of the right-lower field reveals decreased breath sounds. Examination of the left-lower field reveals decreased breath sounds. Decreased breath sounds present. No wheezing, rhonchi or rales.  Abdominal:     Palpations: Abdomen is soft.     Tenderness: There is no abdominal tenderness.  Musculoskeletal:     Right lower leg: No swelling.     Left lower leg: No swelling.  Skin:    General: Skin is warm.     Findings: No rash.     Comments: Scab near his left ear  Neurological:     Mental Status: He is unresponsive.     Comments: No response to sternal rub     Data Reviewed: Basic Metabolic Panel: Recent Labs  Lab 10/02/20 0617 10/03/20 0522  NA 141 141  K 3.6 3.1*  CL 107 111  CO2 25 24  GLUCOSE 108* 111*  BUN 14 17  CREATININE 0.87  0.92  CALCIUM 8.9 8.5*   CBC: Recent Labs  Lab 10/02/20 0617 10/03/20 0522  WBC 9.3 7.4  HGB 12.3* 11.2*  HCT 36.7* 33.3*  MCV 90.2 90.2  PLT 166 176     Recent Results (from the past 240 hour(s))  Urine culture     Status: None   Collection Time: 09/28/20  5:00 PM   Specimen: Urine, Random  Result Value Ref Range Status   Specimen Description   Final    URINE, RANDOM Performed at [facility], [address], [place], [place] [address]    Special Requests   Final    Normal Performed at [facility], [address], [place], [place] [address]    Culture   Final    NO GROWTH Performed at [facility], [place] [address], [place], [place] [address]    Report Status 09/30/2020 FINAL  Final  Resp Panel by RT-PCR (Flu A&B, Covid) Nasopharyngeal Swab     Status: None   Collection Time: 09/28/20  6:58 PM   Specimen: Nasopharyngeal Swab; Nasopharyngeal(NP) swabs in vial transport medium  Result Value Ref Range Status   SARS Coronavirus 2 by RT PCR NEGATIVE NEGATIVE Final  Comment: (NOTE) SARS-CoV-2 target nucleic acids are NOT DETECTED.  The SARS-CoV-2 RNA is generally detectable in upper respiratory specimens during the acute phase of infection. The lowest concentration of SARS-CoV-2 viral copies this assay can detect is 138 copies/mL. A negative result does not preclude SARS-Cov-2 infection and should not be used as the sole basis for treatment or other patient management decisions. A negative result may occur with  improper specimen collection/handling, submission of specimen other than nasopharyngeal swab, presence of viral mutation(s) within the areas targeted by this assay, and inadequate number of viral copies(<138 copies/mL). A negative result must be combined with clinical observations, patient history, and epidemiological information. The expected result is Negative.  Fact Sheet for Patients:   EntrepreneurPulse.com.au  Fact Sheet for Healthcare Providers:  IncredibleEmployment.be  This test is no t yet approved or cleared by the [place] FDA and  has been authorized for detection and/or diagnosis of SARS-CoV-2 by FDA under an Emergency Use Authorization (EUA). This EUA will remain  in effect (meaning this test can be used) for the duration of the COVID-19 declaration under Section 564(b)(1) of the Act, 21 U.S.C.section 360bbb-3(b)(1), unless the authorization is terminated  or revoked sooner.       Influenza A by PCR NEGATIVE NEGATIVE Final   Influenza B by PCR NEGATIVE NEGATIVE Final    Comment: (NOTE) The Xpert Xpress SARS-CoV-2/FLU/RSV plus assay is intended as an aid in the diagnosis of influenza from Nasopharyngeal swab specimens and should not be used as a sole basis for treatment. Nasal washings and aspirates are unacceptable for Xpert Xpress SARS-CoV-2/FLU/RSV testing.  Fact Sheet for Patients: EntrepreneurPulse.com.au  Fact Sheet for Healthcare Providers: IncredibleEmployment.be  This test is not yet approved or cleared by the [place] FDA and has been authorized for detection and/or diagnosis of SARS-CoV-2 by FDA under an Emergency Use Authorization (EUA). This EUA will remain in effect (meaning this test can be used) for the duration of the COVID-19 declaration under Section 564(b)(1) of the Act, 21 U.S.C. section 360bbb-3(b)(1), unless the authorization is terminated or revoked.  Performed at [facility], [address] [place], [place] [address]       Scheduled Meds: . feeding supplement (NEPRO CARB STEADY)  237 mL Oral BID BM  . finasteride  5 mg Oral Daily  . neomycin-bacitracin-polymyxin   Topical BID  . tamsulosin  0.4 mg Oral Daily     Assessment/Plan:  End-of-life care.  Patient unresponsive to sternal rub this morning.  Still awaiting  hospice home bed. Acute metabolic encephalopathy with underlying dementia.  Today unresponsive to sternal rub. Failure to thrive Acute kidney injury on chronic kidney disease stage II.  Creatinine peaked at 1.4 and down to 0.92 on last blood draw.  Since going to the hospice home no further blood draws. Essential hypertension Dysphagia Hypernatremia Thrombocytopenia BPH on finasteride and Flomax GERD Hyperlipidemia Peripheral neuropathy Stage II decubiti, present on admission.  See full description below Scab on left face.  Pressure Injury 09/28/20 Buttocks Stage 2 -  Partial thickness loss of dermis presenting as a shallow open injury with a red, pink wound bed without slough. (Active)  09/28/20 2119  Location: Buttocks  Location Orientation:   Staging: Stage 2 -  Partial thickness loss of dermis presenting as a shallow open injury with a red, pink wound bed without slough.  Wound Description (Comments):   Present on Admission: Yes       Code Status:     Code Status  Orders  (From admission, onward)           Start     Ordered   10/03/20 1337  Do not attempt resuscitation (DNR)  Continuous       Question Answer Comment  In the event of cardiac or respiratory ARREST Do not call a "code blue"   In the event of cardiac or respiratory ARREST Do not perform Intubation, CPR, defibrillation or ACLS   In the event of cardiac or respiratory ARREST Use medication by any route, position, wound care, and other measures to relive pain and suffering. May use oxygen, suction and manual treatment of airway obstruction as needed for comfort.   Comments nurse may pronounce      10/03/20 1338           Code Status History     Date Active Date Inactive Code Status Order ID Comments User Context   09/28/2020 2002 10/03/2020 1338 DNR [id number]  Christel Mormon, MD ED   11/15/2019 2324 11/16/2019 0537 DNR [id number]  Hinda Kehr, MD ED   10/19/2019 0236 10/22/2019 0007 DNR [id number]  Sidney Ace, Arvella Merles, MD ED   02/28/2019 0753 03/03/2019 1943 DNR [id number]  Loletha Grayer, MD Inpatient   02/27/2019 1254 02/28/2019 0753 Full Code [id number]  Para Skeans, MD ED      Advance Directive Documentation    Flowsheet Row Most Recent Value  Type of Advance Directive Out of facility DNR (pink MOST or yellow form)  Pre-existing out of facility DNR order (yellow form or pink MOST form) --  "MOST" Form in Place? --      Family Communication: Spoke with son on the phone Disposition Plan: Status is: Inpatient   Dispo: The patient is from: Assisted living              Anticipated d/c is to: Hospice home              Patient currently unresponsive to sternal rub this morning   Difficult to place patient.  No.  Time spent: 23 minutes  [place]

## 2020-10-09 DIAGNOSIS — E870 Hyperosmolality and hypernatremia: Secondary | ICD-10-CM

## 2020-10-09 NOTE — Progress Notes (Signed)
Restless PRN meds given

## 2020-10-09 NOTE — Care Management Important Message (Signed)
Important Message  Patient Details  Name: Billy Bennett. MRN: [id number] Date of Birth: May 29, 1923   Medicare Important Message Given:  Other (see comment)  Pending transfer to [facility] pending bed availability.  Medicare IM withheld at this time.    Dannette Barbara 10/09/2020, 8:52 AM

## 2020-10-09 NOTE — TOC Progression Note (Signed)
Transition of Care (TOC) - Progression Note    Patient Details  Name: Billy Bennett. MRN: [id number] Date of Birth: 1923-08-17  Transition of Care [facility]) CM/SW Contact  Pete Pelt, RN Phone Number: 10/09/2020, 10:48 AM  Clinical Narrative: Billy Bennett at bedside.  Hospice House will have a bed tonight.  EMS will pick patient up at 6pm.  Friend aware, [facility] will contact nephew.  Care team aware.           Expected Discharge Plan and Services                                                 Social Determinants of Health (SDOH) Interventions    Readmission Risk Interventions Readmission Risk Prevention Plan 10/05/2020  Transportation Screening Complete  PCP or Specialist Appt within 3-5 Days Complete  HRI or [place] Complete  Social Work Consult for [place] Planning/Counseling Not Complete  SW consult not completed comments RNCM assigned to case  Palliative Care Screening Complete  Medication Review Press photographer) Complete  Some recent data might be hidden

## 2020-10-09 NOTE — Progress Notes (Signed)
[facility] Liaison Note:  Hospice Home is able to offer a room to Mr. Nevins today. Spoke with patient's nephew/HCPOA, Cindie Laroche, to confirm continued interest. Family agreeable to transfer patient to [facility] today.   Dreama Saa, RN [facility] manager aware.   RN please call report to [place] at [phone] prior to patient leaving the unit.   Please send signed and completed DNR with patient at discharge.   Thank you,  Bobbie "Loren Racer, [place], BSN [facility] Liaison [phone]

## 2020-10-09 NOTE — Discharge Summary (Signed)
[facility] NAME: Billy Bennett    MR#:  [id number]  DATE OF BIRTH:  1923-06-21  DATE OF ADMISSION:  09/28/2020 ADMITTING PHYSICIAN: Christel Mormon, MD  DATE OF DISCHARGE: 10/10/2019  PRIMARY CARE PHYSICIAN: Gayland Curry, MD   ADMISSION DIAGNOSIS:  AMS (altered mental status) [R41.82]  DISCHARGE DIAGNOSIS:  Active Problems:   AMS (altered mental status)   Pressure injury of skin   End of life care   Failure to thrive in adult   Acute kidney injury superimposed on CKD ([place])   Benign prostatic hyperplasia   SECONDARY DIAGNOSIS:   Past Medical History:  Diagnosis Date   Anxiety    Arthritis    Dementia ([place])    DNR no code (do not resuscitate)    Enlarged prostate    GERD (gastroesophageal reflux disease)    Heart murmur, systolic 40/97/3532   HOH (hard of hearing)    Hypercholesteremia    Hypertension    Other constipation 07/01/2017   Pure hypercholesterolemia 04/01/2011   Skin cancer 08/22/2016   Overview:  Patient sees Dr. Phillip Heal    HOSPITAL COURSE:   1.  End-of-life care.  Patient's mental status does wax and wane.  Excepted to the hospice home and will go there this evening.  As needed medications for symptom management. 2.  Acute metabolic encephalopathy with underlying dementia.  Yesterday was unresponsive to sternal rub.  Today able to answer some questions. 3.  Failure to thrive.  Palliative care does not think he will be able to keep up with his nutritional needs and will be discharged to the hospice home. 4.  Acute kidney injury on chronic kidney disease stage II.  Creatinine peaked at 1.4 and down to 0.92 on last blood draw. 5.  Essential hypertension 6.  Dysphagia on dysphagia 1 diet with nectar thick liquids 7.  Hypernatremia E.  Thrombocytopenia 9.  BPH on finasteride and Flomax 10.  GERD 11.  Hyperlipidemia 12.  Peripheral neuropathy 13.  Stage II decubiti buttocks, present on admission with  shallow open injury with red-pink wound bed without slough 14.  Scab on left face. 15.  Patient is a DO NOT RESUSCITATE  DISCHARGE CONDITIONS:   Guarded  CONSULTS OBTAINED:   Palliative care  DRUG ALLERGIES:   Allergies  Allergen Reactions   Ace Inhibitors Cough   Telmisartan-Hctz Cough    Other Reaction: ARBS - COUGH   Amoxicillin-Pot Clavulanate Diarrhea    Has patient had a PCN reaction causing immediate rash, facial/tongue/throat swelling, SOB or lightheadedness with hypotension:unsure Has patient had a PCN reaction causing severe rash involving mucus membranes or skin necrosis:unsure Has patient had a PCN reaction that required hospitalization:unsure  Has patient had a PCN reaction occurring within the last 10 years:Yes If all of the above answers are "NO", then may proceed with Cephalosporin use.    Ibuprofen Other (See Comments)    Ankle swelling ( can take naprosyn)    DISCHARGE MEDICATIONS:   Allergies as of 10/09/2020       Reactions   Ace Inhibitors Cough   Telmisartan-hctz Cough   Other Reaction: ARBS - COUGH   Amoxicillin-pot Clavulanate Diarrhea   Has patient had a PCN reaction causing immediate rash, facial/tongue/throat swelling, SOB or lightheadedness with hypotension:unsure Has patient had a PCN reaction causing severe rash involving mucus membranes or skin necrosis:unsure Has patient had a PCN reaction that required hospitalization:unsure  Has patient had a PCN reaction occurring  within the last 10 years:Yes If all of the above answers are "NO", then may proceed with Cephalosporin use.   Ibuprofen Other (See Comments)   Ankle swelling ( can take naprosyn)        Medication List     STOP taking these medications    amLODipine 10 MG tablet Commonly known as: NORVASC   donepezil 5 MG tablet Commonly known as: ARICEPT   ferrous sulfate 325 (65 FE) MG tablet   gabapentin 300 MG capsule Commonly known as: NEURONTIN   hydrALAZINE 25 MG  tablet Commonly known as: APRESOLINE   hydrochlorothiazide 12.5 MG capsule Commonly known as: MICROZIDE   omeprazole 20 MG capsule Commonly known as: PRILOSEC   pravastatin 10 MG tablet Commonly known as: PRAVACHOL   sertraline 25 MG tablet Commonly known as: ZOLOFT   traMADol 50 MG tablet Commonly known as: ULTRAM       TAKE these medications    acetaminophen 325 MG tablet Commonly known as: TYLENOL Take 2 tablets (650 mg total) by mouth every 6 (six) hours as needed for mild pain (or Fever >/= 101). What changed:  medication strength how much to take when to take this reasons to take this   antiseptic oral rinse Liqd Apply 15 mLs topically as needed for dry mouth.   feeding supplement (NEPRO CARB STEADY) Liqd Take 237 mLs by mouth 2 (two) times daily between meals.   finasteride 5 MG tablet Commonly known as: PROSCAR Take 5 mg by mouth daily.   glycopyrrolate 0.2 MG/ML injection Commonly known as: ROBINUL Inject 1 mL (0.2 mg total) into the vein every 4 (four) hours as needed (excessive secretions).   haloperidol lactate 5 MG/ML injection Commonly known as: HALDOL Inject 0.1 mLs (0.5 mg total) into the vein every 4 (four) hours as needed (or delirium).   morphine CONCENTRATE 10 MG/0.5ML Soln concentrated solution Place 0.13-0.25 mLs (2.6-5 mg total) under the tongue every 2 (two) hours as needed for moderate pain (or dyspnea).   neomycin-bacitracin-polymyxin ointment Commonly known as: NEOSPORIN apply twice a day near left ear   polyvinyl alcohol 1.4 % ophthalmic solution Commonly known as: LIQUIFILM TEARS Place 1 drop into both eyes 4 (four) times daily as needed for dry eyes.   tamsulosin 0.4 MG Caps capsule Commonly known as: FLOMAX Take 0.4 mg by mouth.   traZODone 50 MG tablet Commonly known as: DESYREL Take 0.5 tablets (25 mg total) by mouth at bedtime as needed for sleep. What changed:  how much to take when to take this reasons to take  this         DISCHARGE INSTRUCTIONS:   Follow-up with the hospice home  If you experience worsening of your admission symptoms, develop shortness of breath, life threatening emergency, suicidal or homicidal thoughts you must seek medical attention immediately by calling 911 or calling your MD immediately  if symptoms less severe.  You Must read complete instructions/literature along with all the possible adverse reactions/side effects for all the Medicines you take and that have been prescribed to you. Take any new Medicines after you have completely understood and accept all the possible adverse reactions/side effects.   Please note  You were cared for by a hospitalist during your hospital stay. If you have any questions about your discharge medications or the care you received while you were in the hospital after you are discharged, you can call the unit and asked to speak with the hospitalist on call if the hospitalist that  took care of you is not available. Once you are discharged, your primary care physician will handle any further medical issues. Please note that NO REFILLS for any discharge medications will be authorized once you are discharged, as it is imperative that you return to your primary care physician (or establish a relationship with a primary care physician if you do not have one) for your aftercare needs so that they can reassess your need for medications and monitor your lab values.    Today   CHIEF COMPLAINT:   Chief Complaint  Patient presents with   Altered Mental Status    HISTORY OF PRESENT ILLNESS:  Billy Bennett  is a 87 y.o. male with a known history of initially came into the hospital with altered mental status   VITAL SIGNS:  Blood pressure (!) 154/97, pulse 82, temperature 99.5 F (37.5 C), resp. rate 20, height 5\' 5"  (1.651 m), weight 59.8 kg, SpO2 95 %.  I/O:   Intake/Output Summary (Last 24 hours) at 10/09/2020 1344 Last data filed at  10/09/2020 0544 Gross per 24 hour  Intake 0 ml  Output 1050 ml  Net -1050 ml    PHYSICAL EXAMINATION:  GENERAL:  87 y.o.-year-old patient lying in the bed with no acute distress.  EYES: Pupils equal, round, reactive to light and accommodation. No scleral icterus. HEENT: Head atraumatic, normocephalic. Oropharynx and nasopharynx clear.  LUNGS: Normal breath sounds bilaterally, no wheezing, rales,rhonchi or crepitation. No use of accessory muscles of respiration.  CARDIOVASCULAR: S1, S2 normal.  3 out of 6 systolic murmurs. ABDOMEN: Soft, non-tender, non-distended.  EXTREMITIES: No pedal edema.  NEUROLOGIC: Moves his extremities on his own PSYCHIATRIC: The patient is alert and answers some questions.  SKIN: Scab near his left ear  DATA REVIEW:   CBC Recent Labs  Lab 10/03/20 0522  WBC 7.4  HGB 11.2*  HCT 33.3*  PLT 176    Chemistries  Recent Labs  Lab 10/03/20 0522  NA 141  K 3.1*  CL 111  CO2 24  GLUCOSE 111*  BUN 17  CREATININE 0.92  CALCIUM 8.5*    Microbiology Results  Results for orders placed or performed during the hospital encounter of 09/28/20  Urine culture     Status: None   Collection Time: 09/28/20  5:00 PM   Specimen: Urine, Random  Result Value Ref Range Status   Specimen Description   Final    URINE, RANDOM Performed at [facility], [address], [place], [place] [address]    Special Requests   Final    Normal Performed at [facility], [address], [place], [place] [address]    Culture   Final    NO GROWTH Performed at [facility], [place] [address] [place], [place] [address]    Report Status 09/30/2020 FINAL  Final  Resp Panel by RT-PCR (Flu A&B, Covid) Nasopharyngeal Swab     Status: None   Collection Time: 09/28/20  6:58 PM   Specimen: Nasopharyngeal Swab; Nasopharyngeal(NP) swabs in vial transport medium  Result Value Ref Range Status   SARS Coronavirus 2 by RT PCR NEGATIVE NEGATIVE Final    Comment:  (NOTE) SARS-CoV-2 target nucleic acids are NOT DETECTED.  The SARS-CoV-2 RNA is generally detectable in upper respiratory specimens during the acute phase of infection. The lowest concentration of SARS-CoV-2 viral copies this assay can detect is 138 copies/mL. A negative result does not preclude SARS-Cov-2 infection and should not be used as the sole  basis for treatment or other patient management decisions. A negative result may occur with  improper specimen collection/handling, submission of specimen other than nasopharyngeal swab, presence of viral mutation(s) within the areas targeted by this assay, and inadequate number of viral copies(<138 copies/mL). A negative result must be combined with clinical observations, patient history, and epidemiological information. The expected result is Negative.  Fact Sheet for Patients:  EntrepreneurPulse.com.au  Fact Sheet for Healthcare Providers:  IncredibleEmployment.be  This test is no t yet approved or cleared by the [place] FDA and  has been authorized for detection and/or diagnosis of SARS-CoV-2 by FDA under an Emergency Use Authorization (EUA). This EUA will remain  in effect (meaning this test can be used) for the duration of the COVID-19 declaration under Section 564(b)(1) of the Act, 21 U.S.C.section 360bbb-3(b)(1), unless the authorization is terminated  or revoked sooner.       Influenza A by PCR NEGATIVE NEGATIVE Final   Influenza B by PCR NEGATIVE NEGATIVE Final    Comment: (NOTE) The Xpert Xpress SARS-CoV-2/FLU/RSV plus assay is intended as an aid in the diagnosis of influenza from Nasopharyngeal swab specimens and should not be used as a sole basis for treatment. Nasal washings and aspirates are unacceptable for Xpert Xpress SARS-CoV-2/FLU/RSV testing.  Fact Sheet for Patients: EntrepreneurPulse.com.au  Fact Sheet for Healthcare  Providers: IncredibleEmployment.be  This test is not yet approved or cleared by the [place] FDA and has been authorized for detection and/or diagnosis of SARS-CoV-2 by FDA under an Emergency Use Authorization (EUA). This EUA will remain in effect (meaning this test can be used) for the duration of the COVID-19 declaration under Section 564(b)(1) of the Act, 21 U.S.C. section 360bbb-3(b)(1), unless the authorization is terminated or revoked.  Performed at [facility], [address], [place], [place] [address]     Management plans discussed with the patient, family and they are in agreement.  CODE STATUS:     Code Status Orders  (From admission, onward)           Start     Ordered   10/03/20 1337  Do not attempt resuscitation (DNR)  Continuous       Question Answer Comment  In the event of cardiac or respiratory ARREST Do not call a "code blue"   In the event of cardiac or respiratory ARREST Do not perform Intubation, CPR, defibrillation or ACLS   In the event of cardiac or respiratory ARREST Use medication by any route, position, wound care, and other measures to relive pain and suffering. May use oxygen, suction and manual treatment of airway obstruction as needed for comfort.   Comments nurse may pronounce      10/03/20 1338           Code Status History     Date Active Date Inactive Code Status Order ID Comments User Context   09/28/2020 2002 10/03/2020 1338 DNR [id number]  Christel Mormon, MD ED   11/15/2019 2324 11/16/2019 0537 DNR [id number]  Hinda Kehr, MD ED   10/19/2019 0236 10/22/2019 0007 DNR [id number]  Sidney Ace, Arvella Merles, MD ED   02/28/2019 0753 03/03/2019 1943 DNR [id number]  Loletha Grayer, MD Inpatient   02/27/2019 1254 02/28/2019 0753 Full Code [id number]  Para Skeans, MD ED      Advance Directive Documentation    Flowsheet Row Most Recent Value  Type of Advance Directive Out of facility DNR (pink MOST or yellow form),  Healthcare Power of Attorney  Pre-existing  out of facility DNR order (yellow form or pink MOST form) --  "MOST" Form in Place? --       TOTAL TIME TAKING CARE OF THIS PATIENT: 31 minutes.    Loletha Grayer M.D on 10/09/2020 at 1:44 PM  [facility]  CC: Primary care physician; Gayland Curry, MD

## 2020-10-09 NOTE — Progress Notes (Signed)
     Chart Reviewed. Patient Assessed.   Patient is resting in bed. Awake, appears comfortable. No acute distress noted. Appetite remains poor. Tray at the bedside untouched.   Comfort care measures. Nephew Richard awareness of current plan. He advised patient has a bed at the hospice home and confirms plans to transfer today. Continued support and education provided regarding ongoing support and symptom management at the hospice home.   Family appreciative of care provided. All questions answered and support provided.   Assessment NAD, ill appearing, appetite remains poor Diminished bilaterally  Plan -Continued comfort care -Plans to transfer to the hospice home today  Time Total: 25 min.   Visit consisted of counseling and education dealing with the complex and emotionally intense issues of symptom management and palliative care in the setting of serious and potentially life-threatening illness.Greater than 50%  of this time was spent counseling and coordinating care related to the above assessment and plan.  Alda Lea, AGPCNP-BC  Palliative Medicine Team [phone]

## 2020-11-13 DEATH — deceased

## 2022-04-15 IMAGING — DX DG CHEST 1V PORT
1 series · 1 of 1 positions shown · non-contrast
Comparison: February 27, 2019

CLINICAL DATA: Nausea

EXAM:
PORTABLE CHEST 1 VIEW

[chest ap]
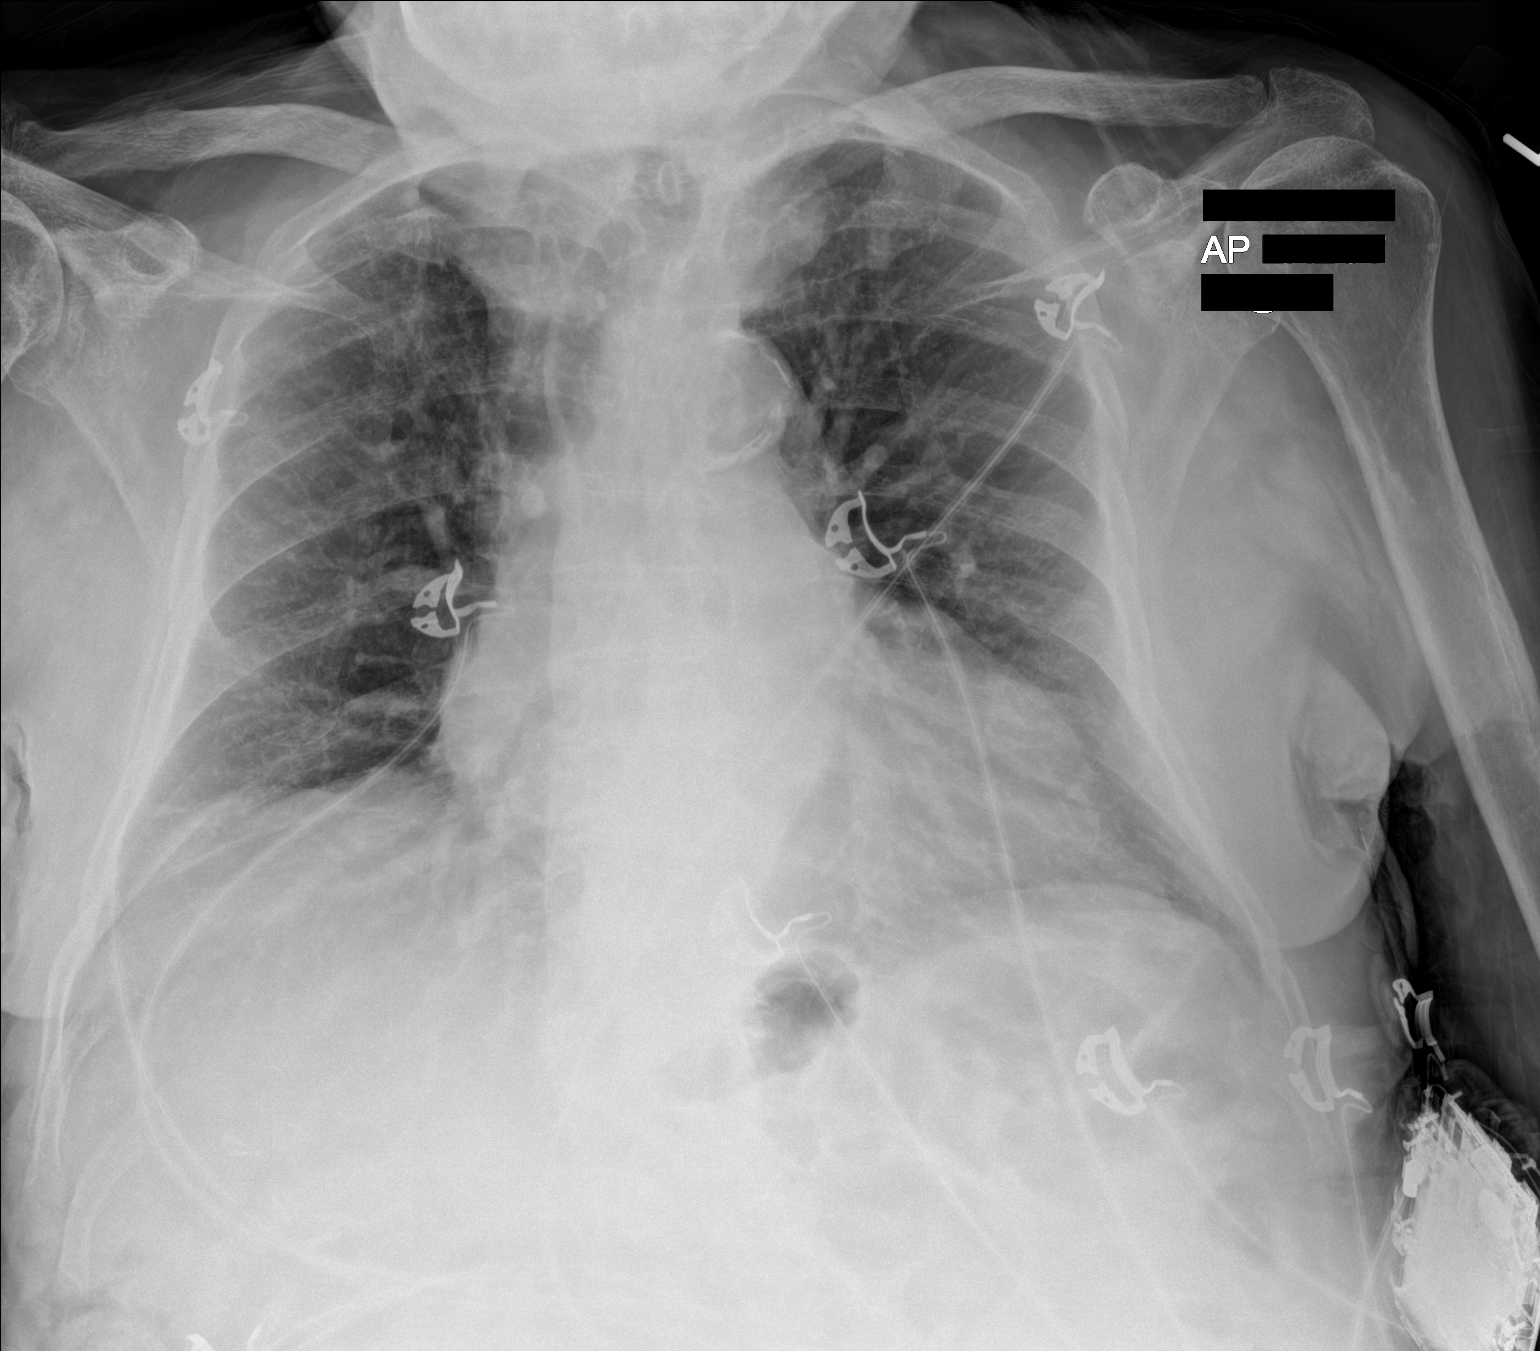

[1 of 1 positions shown; findings below may reference images not displayed]

FINDINGS: Mild diffuse chronic appearing increased lung markings are seen.
There is no evidence of acute infiltrate, pleural effusion or
pneumothorax. There is mild to moderate severity enlargement of the
cardiac silhouette. There is marked severity calcification of the
aortic arch. Multilevel degenerative changes are seen throughout the
thoracic spine.
IMPRESSION: Chronic appearing increased lung markings without evidence of acute
or active cardiopulmonary disease.

## 2023-03-27 IMAGING — CT CT HEAD W/O CM
3 of 4 series · 15 of 47 positions shown, 18 images · non-contrast
Comparison: 07/22/2020 head CT.

CLINICAL DATA: Altered mental status.  No reported injury.

EXAM:
CT HEAD WITHOUT CONTRAST
TECHNIQUE: Contiguous axial images were obtained from the base of the skull
through the vertex without intravenous contrast.

[Series 3: head wo · axial · 0.41mm/px · z∈[+130,+235]mm · 9 of 27 slices shown, 12 images]
[im 3/27  brain]
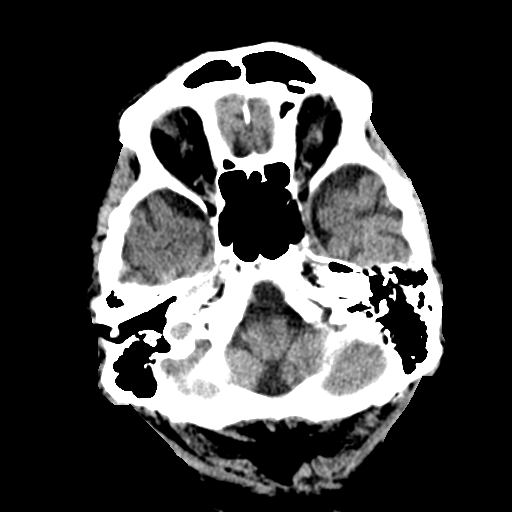
[im 3/27  bone]
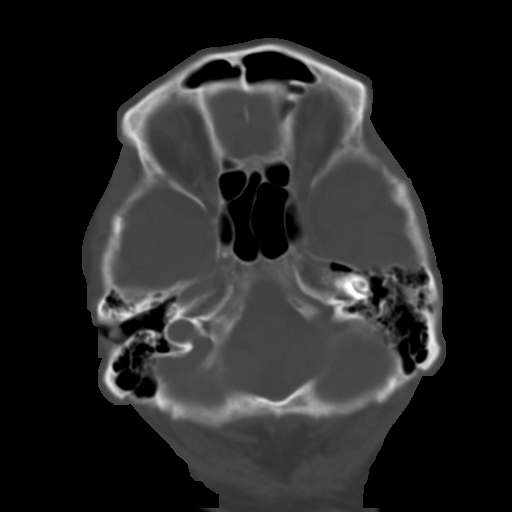
[im 5/27  brain]
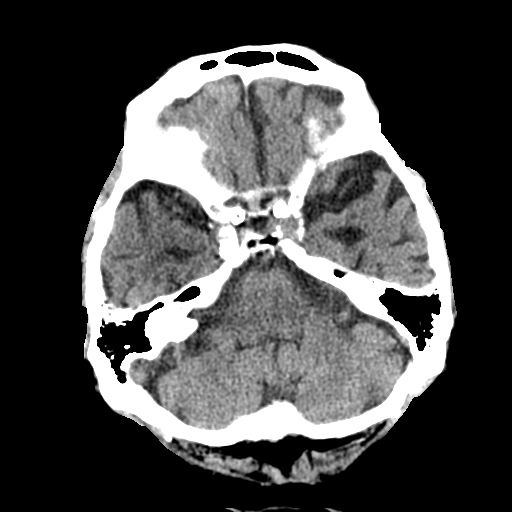
[im 8/27  brain]
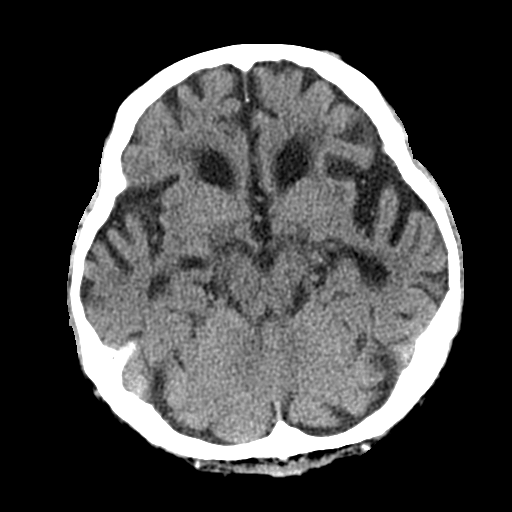
[im 10/27  brain]
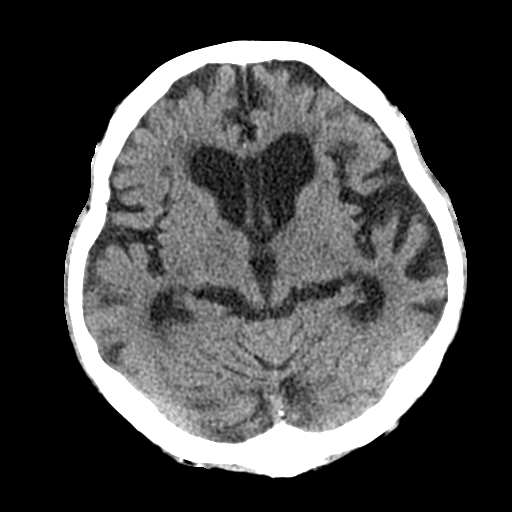
[im 14/27  brain]
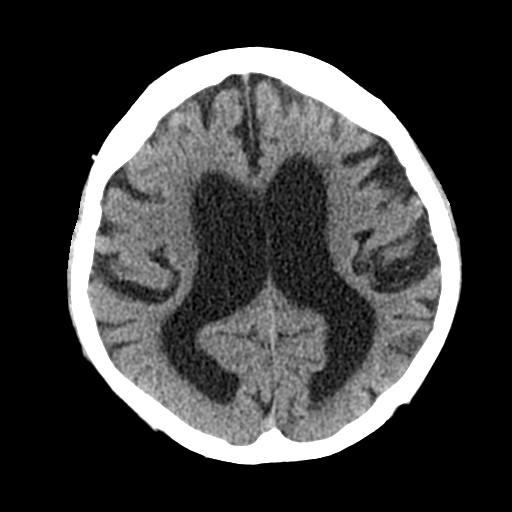
[im 14/27  bone]
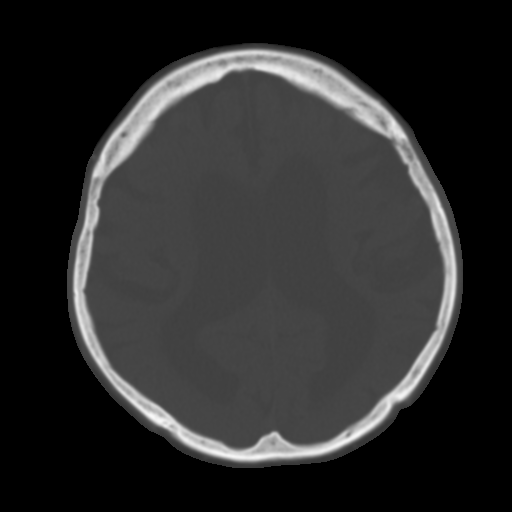
[im 17/27  brain]
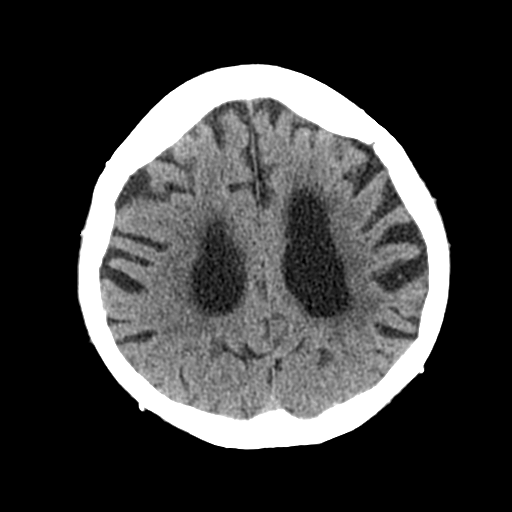
[im 19/27  brain]
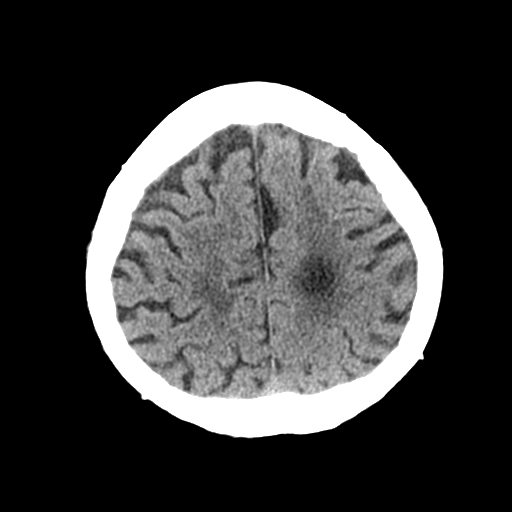
[im 22/27  brain]
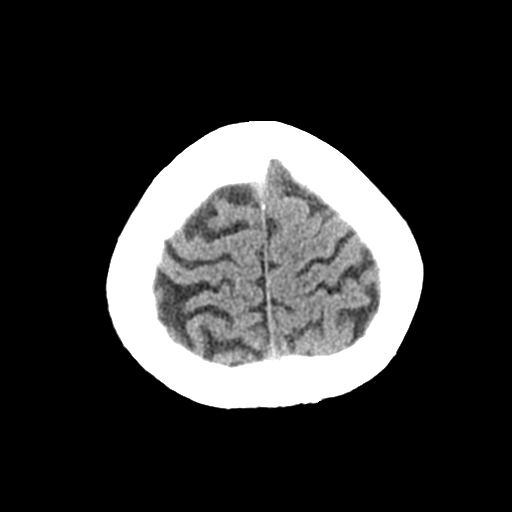
[im 24/27  brain]
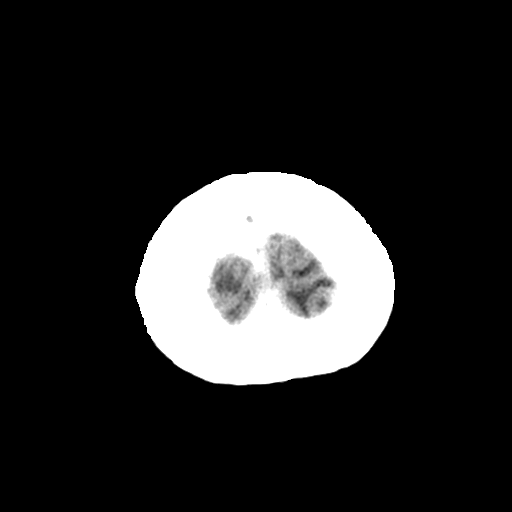
[im 24/27  bone]
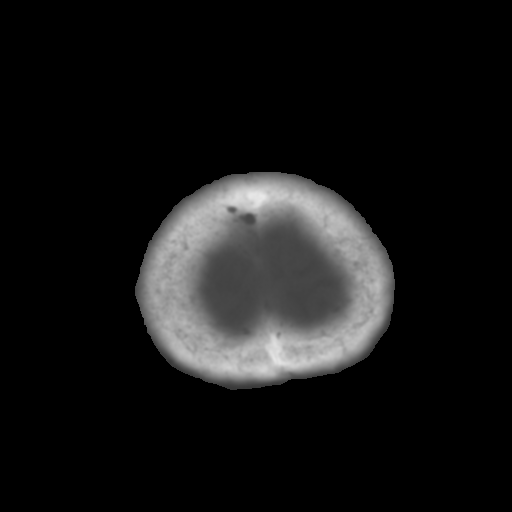

[Series 6: coronal soft tissue · coronal · 0.30mm/px · 3 of 63 slices shown]
[im 21/63  brain]
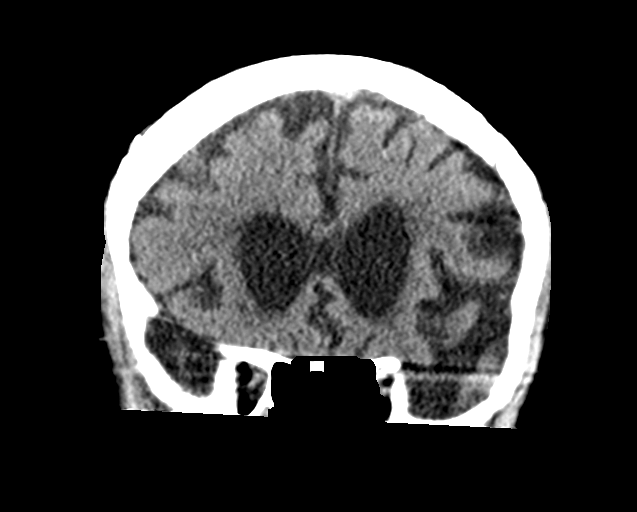
[im 28/63  brain]
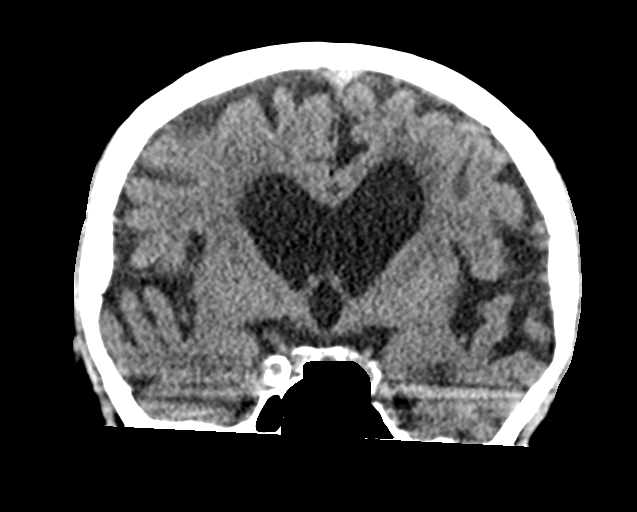
[im 35/63  brain]
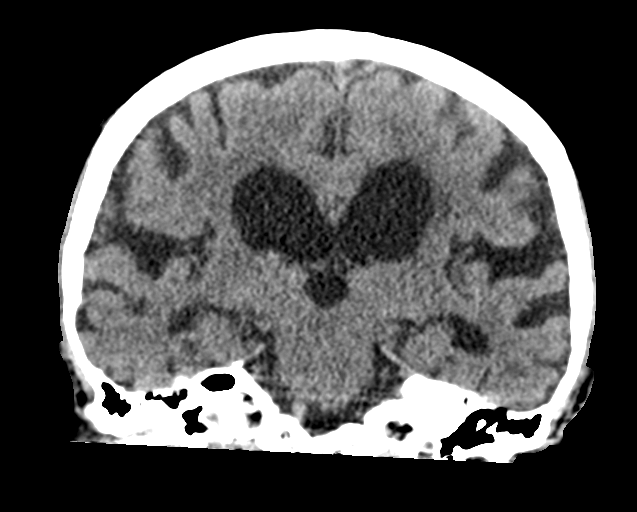

[Series 7: sagittal soft tissue · sagittal · 0.30mm/px · 3 of 58 slices shown]
[im 20/58  brain]
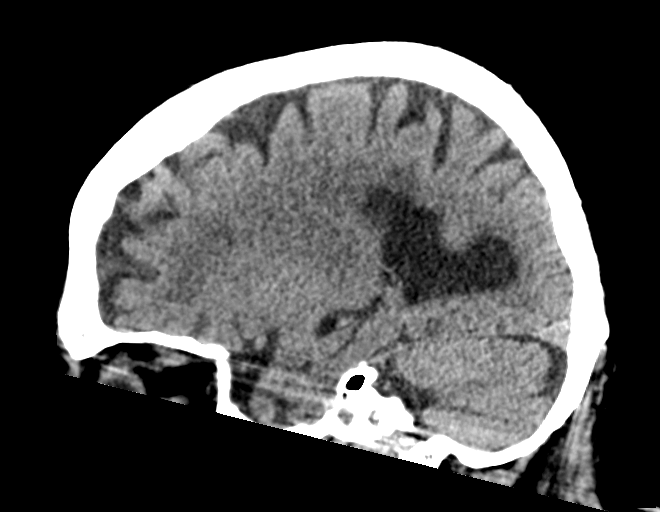
[im 29/58  brain]
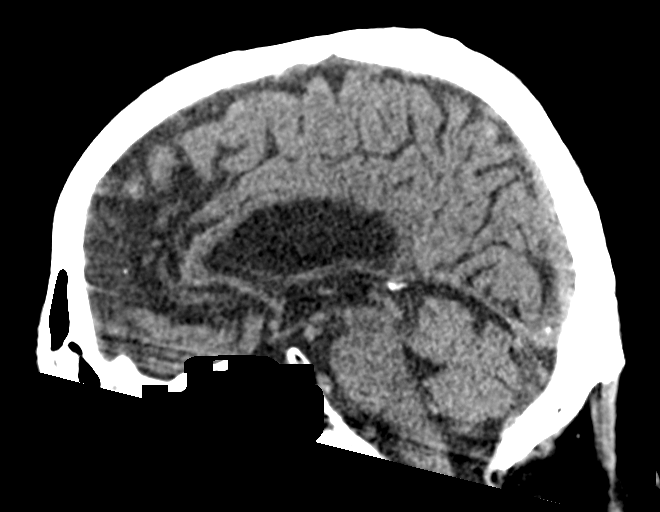
[im 39/58  brain]
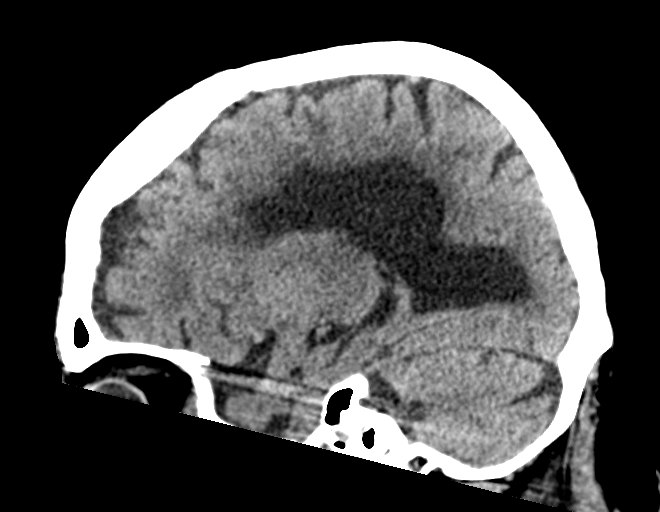

[15 of 47 positions shown; findings below may reference images not displayed]

FINDINGS: Brain: No evidence of parenchymal hemorrhage or extra-axial fluid
collection. No mass lesion, mass effect, or midline shift. No CT
evidence of acute infarction. Generalized cerebral volume loss.
Nonspecific mild subcortical and periventricular white matter
hypodensity, most in keeping with chronic small vessel ischemic
change. Cerebral ventricle sizes are stable and concordant with the
degree of cerebral volume loss.

Vascular: No acute abnormality.

Skull: No evidence of calvarial fracture.

Sinuses/Orbits: No fluid levels. Chronic mucoperiosteal thickening
in the right ethmoidal air cells.

Other:  The mastoid air cells are unopacified.
IMPRESSION: 1. No evidence of acute intracranial abnormality.
2. Generalized cerebral volume loss and mild chronic small vessel
ischemic changes in the cerebral white matter.
3. Chronic right ethmoidal sinusitis.
